# Patient Record
Sex: Female | Born: 1940
Health system: Southern US, Community
[De-identification: ages and names within clinical notes are randomized; demographics above are authoritative.]

## PROBLEM LIST (undated history)

## (undated) DIAGNOSIS — I10 Essential (primary) hypertension: Secondary | ICD-10-CM

## (undated) DIAGNOSIS — I2510 Atherosclerotic heart disease of native coronary artery without angina pectoris: Secondary | ICD-10-CM

## (undated) DIAGNOSIS — E119 Type 2 diabetes mellitus without complications: Secondary | ICD-10-CM

## (undated) DIAGNOSIS — N189 Chronic kidney disease, unspecified: Secondary | ICD-10-CM

## (undated) DIAGNOSIS — G4730 Sleep apnea, unspecified: Secondary | ICD-10-CM

## (undated) HISTORY — PX: CARDIAC CATHETERIZATION: SHX172

## (undated) HISTORY — PX: CORONARY ANGIOPLASTY: SHX604

## (undated) HISTORY — DX: Essential (primary) hypertension: I10

## (undated) HISTORY — DX: Atherosclerotic heart disease of native coronary artery without angina pectoris: I25.10

## (undated) HISTORY — DX: Type 2 diabetes mellitus without complications: E11.9

## (undated) HISTORY — DX: Chronic kidney disease, unspecified: N18.9

## (undated) HISTORY — DX: Sleep apnea, unspecified: G47.30

---

## 2006-08-14 HISTORY — PX: ACNE CYST REMOVAL: SUR1112

## 2015-06-04 DIAGNOSIS — I10 Essential (primary) hypertension: Secondary | ICD-10-CM | POA: Insufficient documentation

## 2017-06-04 DIAGNOSIS — Z9861 Coronary angioplasty status: Secondary | ICD-10-CM | POA: Insufficient documentation

## 2017-06-04 DIAGNOSIS — IMO0001 Reserved for inherently not codable concepts without codable children: Secondary | ICD-10-CM | POA: Insufficient documentation

## 2017-06-04 DIAGNOSIS — E785 Hyperlipidemia, unspecified: Secondary | ICD-10-CM | POA: Insufficient documentation

## 2017-06-04 DIAGNOSIS — R002 Palpitations: Secondary | ICD-10-CM | POA: Insufficient documentation

## 2017-06-04 DIAGNOSIS — I2510 Atherosclerotic heart disease of native coronary artery without angina pectoris: Secondary | ICD-10-CM | POA: Insufficient documentation

## 2018-01-21 DIAGNOSIS — E669 Obesity, unspecified: Secondary | ICD-10-CM | POA: Insufficient documentation

## 2019-04-28 ENCOUNTER — Encounter: Payer: Self-pay | Admitting: Family Medicine

## 2019-04-28 ENCOUNTER — Ambulatory Visit (INDEPENDENT_AMBULATORY_CARE_PROVIDER_SITE_OTHER): Payer: Medicare Other | Admitting: Family Medicine

## 2019-04-28 ENCOUNTER — Other Ambulatory Visit: Payer: Self-pay

## 2019-04-28 VITALS — BP 112/58 | HR 68 | Temp 96.6°F | Resp 16 | Ht 64.0 in | Wt 190.4 lb

## 2019-04-28 DIAGNOSIS — E119 Type 2 diabetes mellitus without complications: Secondary | ICD-10-CM | POA: Diagnosis not present

## 2019-04-28 DIAGNOSIS — I10 Essential (primary) hypertension: Secondary | ICD-10-CM | POA: Diagnosis not present

## 2019-04-28 DIAGNOSIS — M545 Low back pain: Secondary | ICD-10-CM

## 2019-04-28 DIAGNOSIS — G8929 Other chronic pain: Secondary | ICD-10-CM

## 2019-04-28 DIAGNOSIS — E782 Mixed hyperlipidemia: Secondary | ICD-10-CM

## 2019-04-28 DIAGNOSIS — Z794 Long term (current) use of insulin: Secondary | ICD-10-CM | POA: Diagnosis not present

## 2019-04-28 DIAGNOSIS — Z23 Encounter for immunization: Secondary | ICD-10-CM | POA: Diagnosis not present

## 2019-04-28 DIAGNOSIS — I25119 Atherosclerotic heart disease of native coronary artery with unspecified angina pectoris: Secondary | ICD-10-CM | POA: Diagnosis not present

## 2019-04-28 DIAGNOSIS — IMO0001 Reserved for inherently not codable concepts without codable children: Secondary | ICD-10-CM

## 2019-04-28 LAB — LIPID PANEL
Cholesterol: 94 mg/dL (ref 0–200)
HDL: 30.7 mg/dL — ABNORMAL LOW (ref >39.00)
LDL Cholesterol: 47 mg/dL (ref 0–99)
NonHDL: 62.97
Total CHOL/HDL Ratio: 3
Triglycerides: 80 mg/dL (ref 0.0–149.0)
VLDL: 16 mg/dL (ref 0.0–40.0)

## 2019-04-28 LAB — CBC WITH DIFFERENTIAL/PLATELET
Basophils Absolute: 0 10*3/uL (ref 0.0–0.1)
Basophils Relative: 0.5 % (ref 0.0–3.0)
Eosinophils Absolute: 0.1 10*3/uL (ref 0.0–0.7)
Eosinophils Relative: 1.6 % (ref 0.0–5.0)
HCT: 38.1 % (ref 36.0–46.0)
Hemoglobin: 12.2 g/dL (ref 12.0–15.0)
Lymphocytes Relative: 23.3 % (ref 12.0–46.0)
Lymphs Abs: 2.2 10*3/uL (ref 0.7–4.0)
MCHC: 32.1 g/dL (ref 30.0–36.0)
MCV: 89.1 fl (ref 78.0–100.0)
Monocytes Absolute: 0.7 10*3/uL (ref 0.1–1.0)
Monocytes Relative: 7.7 % (ref 3.0–12.0)
Neutro Abs: 6.2 10*3/uL (ref 1.4–7.7)
Neutrophils Relative %: 66.9 % (ref 43.0–77.0)
Platelets: 229 10*3/uL (ref 150.0–400.0)
RBC: 4.27 Mil/uL (ref 3.87–5.11)
RDW: 14.1 % (ref 11.5–15.5)
WBC: 9.2 10*3/uL (ref 4.0–10.5)

## 2019-04-28 LAB — COMPREHENSIVE METABOLIC PANEL
ALT: 13 U/L (ref 0–35)
AST: 18 U/L (ref 0–37)
Albumin: 4 g/dL (ref 3.5–5.2)
Alkaline Phosphatase: 105 U/L (ref 39–117)
BUN: 26 mg/dL — ABNORMAL HIGH (ref 6–23)
CO2: 28 mEq/L (ref 19–32)
Calcium: 10 mg/dL (ref 8.4–10.5)
Chloride: 106 mEq/L (ref 96–112)
Creatinine, Ser: 1.67 mg/dL — ABNORMAL HIGH (ref 0.40–1.20)
GFR: 29.72 mL/min — ABNORMAL LOW (ref >60.00)
Glucose, Bld: 125 mg/dL — ABNORMAL HIGH (ref 70–99)
Potassium: 3.9 mEq/L (ref 3.5–5.1)
Sodium: 143 mEq/L (ref 135–145)
Total Bilirubin: 0.4 mg/dL (ref 0.2–1.2)
Total Protein: 7.3 g/dL (ref 6.0–8.3)

## 2019-04-28 LAB — TSH: TSH: 1.15 u[IU]/mL (ref 0.35–4.50)

## 2019-04-28 LAB — HEMOGLOBIN A1C: Hgb A1c MFr Bld: 9.4 % — ABNORMAL HIGH (ref 4.6–6.5)

## 2019-04-28 NOTE — Patient Instructions (Signed)
We will call with lab work results when available. We will base next follow up appt off of lab results.

## 2019-04-28 NOTE — Progress Notes (Signed)
Subjective:    Patient ID: Vickie Kemp, female    DOB: 1940/12/17, 78 y.o.   MRN: [id number]  HPI   Patient presents to clinic to establish with PCP.  She recently moved to area from [place].  She has history of CAD, hypertension, insulin-dependent diabetes, hyperlipidemia obesity and coronary angioplasty.  Last lab work was in February 2020, we will get new labs today.  Patient states she feels stable on all her current medications.  Tolerates them without any adverse effects.  Denies any chest pain, shortness of breath, palpitations or lower extremity swelling.  States her blood sugars have been running a little higher in the 150s to 200s, knows that this is due to her eating ice cream almost every day for the past week or 2.  Patient states her favorite ice cream flavor, black walnut, is only available down here in the [place] and she has gone a little overboard over eating her favorite ice cream.  Plans to get back on the ball in regards to watching her sugar and carbohydrate intake.  Has never had colonoscopy and declines getting one at this time.  Understands risks of not getting colonoscopy.  Would like to hold off on getting mammogram ordered at this time, but will reconsider at future visit.  Is interested in flu vaccine today.  Patient also has chronic low back pain as dull with this off and on for many years.  Tries to do a lot of walking, but states sometimes when she is walking longer distances will have to stop due to aching in low back.  Does use Tylenol on occasion, but not regularly.  Has just discovered Biofreeze and feels that this is helped reduce back pain somewhat.  Does not do any sort or stretching or strengthening of muscle type exercises.  Patient Active Problem List   Diagnosis Date Noted  . Obesity 01/21/2018  . CAD (coronary artery disease) 06/04/2017  . Hyperlipemia 06/04/2017  . Insulin dependent diabetes mellitus ([place]) 06/04/2017  . S/P PTCA (percutaneous  transluminal coronary angioplasty) 06/04/2017  . Essential (primary) hypertension 06/04/2015   Social History   Tobacco Use  . Smoking status: Never Smoker  . Smokeless tobacco: Never Used  Substance Use Topics  . Alcohol use: Never    Frequency: Never   Past Surgical History:  Procedure Laterality Date  . ACNE CYST REMOVAL  2008   Groin area   History reviewed. No pertinent family history.   Review of Systems   Constitutional: Negative for chills, fatigue and fever.  HENT: Negative for congestion, ear pain, sinus pain and sore throat.   Eyes: Negative.   Respiratory: Negative for cough, shortness of breath and wheezing.   Cardiovascular: Negative for chest pain, palpitations and leg swelling.  Gastrointestinal: Negative for abdominal pain, diarrhea, nausea and vomiting.  Genitourinary: Negative for dysuria, frequency and urgency.  Musculoskeletal: Negative for arthralgias and myalgias.  Skin: Negative for color change, pallor and rash.  Neurological: Negative for syncope, light-headedness and headaches.  Psychiatric/Behavioral: The patient is not nervous/anxious.       Objective:   Physical Exam Vitals signs and nursing note reviewed.  Constitutional:      General: She is not in acute distress.    Appearance: She is not toxic-appearing or diaphoretic.  HENT:     Head: Normocephalic and atraumatic.  Eyes:     General: No scleral icterus.    Extraocular Movements: Extraocular movements intact.     Conjunctiva/sclera: Conjunctivae normal.  Pupils: Pupils are equal, round, and reactive to light.  Neck:     Musculoskeletal: Normal range of motion and neck supple. No neck rigidity.  Cardiovascular:     Rate and Rhythm: Normal rate and regular rhythm.     Heart sounds: Normal heart sounds.  Pulmonary:     Effort: Pulmonary effort is normal.     Breath sounds: Normal breath sounds.  Abdominal:     General: Bowel sounds are normal. There is no distension.      Palpations: Abdomen is soft. There is no mass.     Tenderness: There is no abdominal tenderness. There is no right CVA tenderness, left CVA tenderness, guarding or rebound.     Hernia: No hernia is present.  Musculoskeletal:     Right lower leg: No edema.     Left lower leg: No edema.  Skin:    General: Skin is warm and dry.     Capillary Refill: Capillary refill takes less than 2 seconds.     Coloration: Skin is not jaundiced or pale.  Neurological:     General: No focal deficit present.     Mental Status: She is alert and oriented to person, place, and time.     Gait: Gait normal.  Psychiatric:        Mood and Affect: Mood normal.        Behavior: Behavior normal.    Today's Vitals   04/28/19 1410  BP: (!) 112/58  Pulse: 68  Resp: 16  Temp: (!) 96.6 F (35.9 C)  TempSrc: Temporal  SpO2: 95%  Weight: 190 lb 6.4 oz (86.4 kg)  Height: _0  (1.626 m)   Body mass index is 32.68 kg/m.     Assessment & Plan:    1. Coronary artery disease involving native heart with angina pectoris, unspecified vessel or lesion type [facility])  - Ambulatory referral to Cardiology  2. Essential (primary) hypertension  - CBC w/Diff - Comp Met (CMET) - Ambulatory referral to Cardiology  3. Insulin dependent diabetes mellitus (HCC)  - TSH - Lipid panel - Hemoglobin A1c  4. Mixed hyperlipidemia  - Lipid panel - Ambulatory referral to Cardiology  5. Need for immunization against influenza  - Flu Vaccine QUAD High Dose(Fluad)  6. Chronic low back pain  Discussed stretching multiple times per day. Discussed topical rubs like bengay or biofreeze & heating pad. Discussed use of tylenol as needed for pain. Offered PT referral, declines at this time.    Blood work collected in clinic.  Reviewed healthy diet and regular physical activity; discussed carb counting and eating balanced meals. She does not need refill today. Will base need for next follow up after lab results, she will follow  up at least every 3 months

## 2019-04-29 ENCOUNTER — Other Ambulatory Visit: Payer: Self-pay | Admitting: Family Medicine

## 2019-04-29 DIAGNOSIS — N289 Disorder of kidney and ureter, unspecified: Secondary | ICD-10-CM

## 2019-05-01 ENCOUNTER — Ambulatory Visit (INDEPENDENT_AMBULATORY_CARE_PROVIDER_SITE_OTHER): Payer: Medicare Other | Admitting: Podiatry

## 2019-05-01 ENCOUNTER — Other Ambulatory Visit: Payer: Self-pay

## 2019-05-01 ENCOUNTER — Encounter: Payer: Self-pay | Admitting: Podiatry

## 2019-05-01 DIAGNOSIS — M79674 Pain in right toe(s): Secondary | ICD-10-CM

## 2019-05-01 DIAGNOSIS — B351 Tinea unguium: Secondary | ICD-10-CM | POA: Insufficient documentation

## 2019-05-01 DIAGNOSIS — M129 Arthropathy, unspecified: Secondary | ICD-10-CM | POA: Diagnosis not present

## 2019-05-01 DIAGNOSIS — E1159 Type 2 diabetes mellitus with other circulatory complications: Secondary | ICD-10-CM | POA: Diagnosis not present

## 2019-05-01 DIAGNOSIS — M79675 Pain in left toe(s): Secondary | ICD-10-CM

## 2019-05-01 NOTE — Progress Notes (Signed)
This patient presents to the office with chief complaint of long thick nails and diabetic feet.  She states she is having pain on the top of her right foot. .  This patient says there are long thick painful nails.  These nails are painful walking and wearing shoes.  Patient has no history of infection or drainage from both feet.  Patient is unable to  self treat his own nails . This patient presents  to the office today for treatment of the  long nails her right foot pain  and a foot evaluation due to history of  diabetes.  General Appearance  Alert, conversant and in no acute stress.  Vascular  Dorsalis pedis and posterior tibial  pulses are not  palpable  bilaterally.  Capillary return is within normal limits  bilaterally. Temperature is within normal limits  bilaterally.  Neurologic  Senn-Weinstein monofilament wire test within normal limits left.  LOPS diminished right foot.. Muscle power within normal limits bilaterally.  Nails Thick disfigured discolored nails with subungual debris  from hallux to fifth toes bilaterally. No evidence of bacterial infection or drainage bilaterally.  Orthopedic  No limitations of motion of motion feet .  No crepitus or effusions noted.  No bony pathology or digital deformities noted.  Midfoot DJD  B/L with right greater than left.  Rearfoot  ROM diminished.  Skin  normotropic skin with no porokeratosis noted bilaterally.  No signs of infections or ulcers noted.     Onychomycosis  Diabetes with no foot complications  Midfoot arthritis B/L  IE  Debride nails x 10.  A diabetic foot exam was performed and there is  evidence of  vascular or neurologic pathology.   RTC 3 months. Discussed powerstep insoles to be worn to support both feet and eliminate her midfoot pain.  RTC 3 months.  Gardiner Barefoot DPM

## 2019-05-09 ENCOUNTER — Ambulatory Visit (INDEPENDENT_AMBULATORY_CARE_PROVIDER_SITE_OTHER): Payer: Medicare Other | Admitting: Cardiology

## 2019-05-09 ENCOUNTER — Encounter: Payer: Self-pay | Admitting: Cardiology

## 2019-05-09 ENCOUNTER — Other Ambulatory Visit: Payer: Self-pay

## 2019-05-09 VITALS — BP 160/70 | HR 65 | Ht 65.0 in | Wt 189.0 lb

## 2019-05-09 DIAGNOSIS — G4733 Obstructive sleep apnea (adult) (pediatric): Secondary | ICD-10-CM | POA: Diagnosis not present

## 2019-05-09 DIAGNOSIS — I10 Essential (primary) hypertension: Secondary | ICD-10-CM

## 2019-05-09 DIAGNOSIS — I25119 Atherosclerotic heart disease of native coronary artery with unspecified angina pectoris: Secondary | ICD-10-CM

## 2019-05-09 DIAGNOSIS — R0609 Other forms of dyspnea: Secondary | ICD-10-CM | POA: Diagnosis not present

## 2019-05-09 NOTE — Patient Instructions (Signed)
Medication Instructions:  Your physician recommends that you continue on your current medications as directed. Please refer to the Current Medication list given to you today.  If you need a refill on your cardiac medications before your next appointment, please call your pharmacy.   Lab work: none If you have labs (blood work) drawn today and your tests are completely normal, you will receive your results only by: [name] MyChart Message (if you have MyChart) OR . A paper copy in the mail If you have any lab test that is abnormal or we need to change your treatment, we will call you to review the results.  Testing/Procedures: Your physician has requested that you have an echocardiogram. Echocardiography is a painless test that uses sound waves to create images of your heart. It provides your doctor with information about the size and shape of your heart and how well your heart's chambers and valves are working. This procedure takes approximately one hour. There are no restrictions for this procedure. You may get an IV, if needed, to receive an ultrasound enhancing agent through to better visualize your heart.     Follow-Up: At [facility], you and your health needs are our priority.  As part of our continuing mission to provide you with exceptional heart care, we have created designated Provider Care Teams.  These Care Teams include your primary Cardiologist (physician) and Advanced Practice Providers (APPs -  Physician Assistants and Nurse Practitioners) who all work together to provide you with the care you need, when you need it. You will need a follow up appointment in 6 weeks.  You may see Kate Sable, MD or one of the following Advanced Practice Providers on your designated Care Team:   Murray Hodgkins, NP Christell Faith, PA-C . Marrianne Mood, PA-C   Echocardiogram An echocardiogram is a procedure that uses painless sound waves (ultrasound) to produce an image of the heart.  Images from an echocardiogram can provide important information about:  Signs of coronary artery disease (CAD).  Aneurysm detection. An aneurysm is a weak or damaged part of an artery wall that bulges out from the normal force of blood pumping through the body.  Heart size and shape. Changes in the size or shape of the heart can be associated with certain conditions, including heart failure, aneurysm, and CAD.  Heart muscle function.  Heart valve function.  Signs of a past heart attack.  Fluid buildup around the heart.  Thickening of the heart muscle.  A tumor or infectious growth around the heart valves. Tell a health care provider about:  Any allergies you have.  All medicines you are taking, including vitamins, herbs, eye drops, creams, and over-the-counter medicines.  Any blood disorders you have.  Any surgeries you have had.  Any medical conditions you have.  Whether you are pregnant or may be pregnant. What are the risks? Generally, this is a safe procedure. However, problems may occur, including:  Allergic reaction to dye (contrast) that may be used during the procedure. What happens before the procedure? No specific preparation is needed. You may eat and drink normally. What happens during the procedure?   An IV tube may be inserted into one of your veins.  You may receive contrast through this tube. A contrast is an injection that improves the quality of the pictures from your heart.  A gel will be applied to your chest.  A wand-like tool (transducer) will be moved over your chest. The gel will help to transmit  the sound waves from the transducer.  The sound waves will harmlessly bounce off of your heart to allow the heart images to be captured in real-time motion. The images will be recorded on a computer. The procedure may vary among health care providers and hospitals. What happens after the procedure?  You may return to your normal, everyday life,  including diet, activities, and medicines, unless your health care provider tells you not to do that. Summary  An echocardiogram is a procedure that uses painless sound waves (ultrasound) to produce an image of the heart.  Images from an echocardiogram can provide important information about the size and shape of your heart, heart muscle function, heart valve function, and fluid buildup around your heart.  You do not need to do anything to prepare before this procedure. You may eat and drink normally.  After the echocardiogram is completed, you may return to your normal, everyday life, unless your health care provider tells you not to do that. This information is not intended to replace advice given to you by your health care provider. Make sure you discuss any questions you have with your health care provider. Document Released: 07/28/2000 Document Revised: 11/21/2018 Document Reviewed: 09/02/2016 Elsevier Patient Education  2020 [employer].

## 2019-05-09 NOTE — Progress Notes (Signed)
Cardiology Office Note:    Date:  05/09/2019   ID:  Larin Depaoli, DOB [birth year], MRN [id number]  PCP:  Jodelle Green, FNP  Cardiologist:  Kate Sable, MD  Electrophysiologist:  None   Referring MD: Jodelle Green, FNP   Chief Complaint  Patient presents with  . New Patient (Initial Visit)    referred for CAD. Patient c/o Swelling in ankles and SOB. Meds reviewed verbally with patient.     History of Present Illness:    Vickie Kemp is a 78 y.o. female with a hx of hypertension, obesity, CAD/MI (status post DES to mLAD 04/2013 at [facility] [place]).  She presents today to establish care after recently moving to [place] from [place].  Per records, angiogram in 2014 showed 90% mid LAD lesion, 80% OM, 60% RCA.  EF at that time was 40%.  She states having some dyspnea on exertion.  Walking causes her to be short of breath, needing to take frequent breaks in her walks.  She has some edema when she has her legs down so she tries to prop them up, and that usually helps.  She denies symptoms of chest pain.  She states having obstructive sleep apnea but does not wear her CPAP machine when she goes to sleep.  She states her blood pressure is usually well controlled in the range of 130s to high as of 140s.   Past Medical History:  Diagnosis Date  . Coronary artery disease    MI, pci to mid LAD 2014. LHC 80%OM, 60%RCA, 90% LAD  . Diabetes mellitus without complication ([place])   . Hypertension     Past Surgical History:  Procedure Laterality Date  . ACNE CYST REMOVAL  2008   Groin area    Current Medications: Current Meds  Medication Sig  . amLODipine (NORVASC) 10 MG tablet Take 10 mg by mouth daily.  [name] aspirin EC 81 MG tablet Take 81 mg by mouth daily.  [name] atorvastatin (LIPITOR) 40 MG tablet Take 40 mg by mouth daily.  . furosemide (LASIX) 40 MG tablet Take 40 mg by mouth.  . Insulin Detemir (LEVEMIR [place]) Inject 30 Units into the skin  at bedtime.  . Insulin Lispro (HUMALOG [place]) Inject 10 Units into the skin as needed.  . labetalol (NORMODYNE) 300 MG tablet Take 300 mg by mouth 2 (two) times daily.  [name] latanoprost (XALATAN) 0.005 % ophthalmic solution Place 1 drop into the left eye at bedtime.  [name] MAGNESIUM SULFATE PO Take 500 mg by mouth.  . Vitamin D, Ergocalciferol, (DRISDOL) 1.25 MG (50000 UT) CAPS capsule Take 50,000 Units by mouth every 7 (seven) days.     Allergies:   Amoxicillin   Social History   Socioeconomic History  . Marital status: Single    Spouse name: Not on file  . Number of children: Not on file  . Years of education: Not on file  . Highest education level: Not on file  Occupational History  . Not on file  Social Needs  . Financial resource strain: Not on file  . Food insecurity    Worry: Not on file    Inability: Not on file  . Transportation needs    Medical: Not on file    Non-medical: Not on file  Tobacco Use  . Smoking status: Never Smoker  . Smokeless tobacco: Never Used  Substance and Sexual Activity  . Alcohol use: Never    Frequency: Never  .  Drug use: Never  . Sexual activity: Not on file  Lifestyle  . Physical activity    Days per week: Not on file    Minutes per session: Not on file  . Stress: Not on file  Relationships  . Social Herbalist on phone: Not on file    Gets together: Not on file    Attends religious service: Not on file    Active member of club or organization: Not on file    Attends meetings of clubs or organizations: Not on file    Relationship status: Not on file  Other Topics Concern  . Not on file  Social History Narrative  . Not on file     Family History: The patient's family history includes Diabetes in her mother; Heart disease in her father.  ROS:   Please see the history of present illness.     All other systems reviewed and are negative.  EKGs/Labs/Other Studies Reviewed:    The following studies were reviewed today:    EKG:  EKG is  ordered today.  The ekg ordered today demonstrates normal sinus rhythm, old septal infarct.  Recent Labs: 04/28/2019: ALT 13; BUN 26; Creatinine, Ser 1.67; Hemoglobin 12.2; Platelets 229.0; Potassium 3.9; Sodium 143; TSH 1.15  Recent Lipid Panel    Component Value Date/Time   CHOL 94 04/28/2019 1408   TRIG 80.0 04/28/2019 1408   HDL 30.70 (L) 04/28/2019 1408   CHOLHDL 3 04/28/2019 1408   VLDL 16.0 04/28/2019 1408   LDLCALC 47 04/28/2019 1408    Physical Exam:    VS:  BP (!) 160/70 (BP Location: Right Arm, Patient Position: Sitting, Cuff Size: Normal)   Pulse 65   Ht 5\' 5"  (1.651 m)   Wt 189 lb (85.7 kg)   BMI 31.45 kg/m     Wt Readings from Last 3 Encounters:  05/09/19 189 lb (85.7 kg)  04/28/19 190 lb 6.4 oz (86.4 kg)     GEN:  Well nourished, well developed in no acute distress HEENT: Normal NECK: No JVD; No carotid bruits LYMPHATICS: No lymphadenopathy CARDIAC: RRR, no murmurs, rubs, gallops RESPIRATORY:  Clear to auscultation without rales, wheezing or rhonchi  ABDOMEN: Soft, non-tender, non-distended MUSCULOSKELETAL: Trace edema; No deformity  SKIN: Warm and dry NEUROLOGIC:  Alert and oriented x 3 PSYCHIATRIC:  Normal affect   ASSESSMENT:   History of CAD no current chest pain.  Her blood pressure seems elevated today, but is usually well controlled upon chart review on the current therapy.  This may be secondary to whitecoat hypertension.  Patient encouraged to check blood pressure at home.  We will go over readings upon next visit. 1. Coronary artery disease involving native heart with angina pectoris, unspecified vessel or lesion type ([place])   2. DOE (dyspnea on exertion)   3. Hypertension, unspecified type   4. OSA (obstructive sleep apnea)    PLAN:      1. CAD status post PCI.  Continue aspirin 81 mg, Lipitor 40 mg. 2. We will get echocardiogram.  Patient encouraged to use CPAP for OSA as this can be contributing to her symptoms. 3. Blood  pressure seems elevated today.  Continue Norvasc 10 mg, labetalol as currently prescribed.  Follow-up after echo.   Medication Adjustments/Labs and Tests Ordered: Current medicines are reviewed at length with the patient today.  Concerns regarding medicines are outlined above.  Orders Placed This Encounter  Procedures  . EKG 12-Lead  . ECHOCARDIOGRAM COMPLETE  No orders of the defined types were placed in this encounter.   Patient Instructions  Medication Instructions:  Your physician recommends that you continue on your current medications as directed. Please refer to the Current Medication list given to you today.  If you need a refill on your cardiac medications before your next appointment, please call your pharmacy.   Lab work: none If you have labs (blood work) drawn today and your tests are completely normal, you will receive your results only by: [name] MyChart Message (if you have MyChart) OR . A paper copy in the mail If you have any lab test that is abnormal or we need to change your treatment, we will call you to review the results.  Testing/Procedures: Your physician has requested that you have an echocardiogram. Echocardiography is a painless test that uses sound waves to create images of your heart. It provides your doctor with information about the size and shape of your heart and how well your heart's chambers and valves are working. This procedure takes approximately one hour. There are no restrictions for this procedure. You may get an IV, if needed, to receive an ultrasound enhancing agent through to better visualize your heart.     Follow-Up: At [facility], you and your health needs are our priority.  As part of our continuing mission to provide you with exceptional heart care, we have created designated Provider Care Teams.  These Care Teams include your primary Cardiologist (physician) and Advanced Practice Providers (APPs -  Physician Assistants and Nurse  Practitioners) who all work together to provide you with the care you need, when you need it. You will need a follow up appointment in 6 weeks.  You may see Kate Sable, MD or one of the following Advanced Practice Providers on your designated Care Team:   Murray Hodgkins, NP Christell Faith, PA-C . Marrianne Mood, PA-C   Echocardiogram An echocardiogram is a procedure that uses painless sound waves (ultrasound) to produce an image of the heart. Images from an echocardiogram can provide important information about:  Signs of coronary artery disease (CAD).  Aneurysm detection. An aneurysm is a weak or damaged part of an artery wall that bulges out from the normal force of blood pumping through the body.  Heart size and shape. Changes in the size or shape of the heart can be associated with certain conditions, including heart failure, aneurysm, and CAD.  Heart muscle function.  Heart valve function.  Signs of a past heart attack.  Fluid buildup around the heart.  Thickening of the heart muscle.  A tumor or infectious growth around the heart valves. Tell a health care provider about:  Any allergies you have.  All medicines you are taking, including vitamins, herbs, eye drops, creams, and over-the-counter medicines.  Any blood disorders you have.  Any surgeries you have had.  Any medical conditions you have.  Whether you are pregnant or may be pregnant. What are the risks? Generally, this is a safe procedure. However, problems may occur, including:  Allergic reaction to dye (contrast) that may be used during the procedure. What happens before the procedure? No specific preparation is needed. You may eat and drink normally. What happens during the procedure?   An IV tube may be inserted into one of your veins.  You may receive contrast through this tube. A contrast is an injection that improves the quality of the pictures from your heart.  A gel will be applied to  your chest.  A wand-like tool (transducer) will be moved over your chest. The gel will help to transmit the sound waves from the transducer.  The sound waves will harmlessly bounce off of your heart to allow the heart images to be captured in real-time motion. The images will be recorded on a computer. The procedure may vary among health care providers and hospitals. What happens after the procedure?  You may return to your normal, everyday life, including diet, activities, and medicines, unless your health care provider tells you not to do that. Summary  An echocardiogram is a procedure that uses painless sound waves (ultrasound) to produce an image of the heart.  Images from an echocardiogram can provide important information about the size and shape of your heart, heart muscle function, heart valve function, and fluid buildup around your heart.  You do not need to do anything to prepare before this procedure. You may eat and drink normally.  After the echocardiogram is completed, you may return to your normal, everyday life, unless your health care provider tells you not to do that. This information is not intended to replace advice given to you by your health care provider. Make sure you discuss any questions you have with your health care provider. Document Released: 07/28/2000 Document Revised: 11/21/2018 Document Reviewed: 09/02/2016 Elsevier Patient Education  2020 [place], Kate Sable, MD  05/09/2019 3:19 PM    [place]

## 2019-06-10 DIAGNOSIS — E1122 Type 2 diabetes mellitus with diabetic chronic kidney disease: Secondary | ICD-10-CM | POA: Insufficient documentation

## 2019-06-10 LAB — HM HEPATITIS C SCREENING LAB: HM Hepatitis Screen: NEGATIVE

## 2019-06-11 ENCOUNTER — Other Ambulatory Visit (HOSPITAL_COMMUNITY): Payer: Self-pay | Admitting: Nephrology

## 2019-06-11 ENCOUNTER — Other Ambulatory Visit: Payer: Self-pay | Admitting: Nephrology

## 2019-06-11 DIAGNOSIS — N184 Chronic kidney disease, stage 4 (severe): Secondary | ICD-10-CM

## 2019-06-20 ENCOUNTER — Ambulatory Visit (INDEPENDENT_AMBULATORY_CARE_PROVIDER_SITE_OTHER): Payer: Medicare Other

## 2019-06-20 ENCOUNTER — Other Ambulatory Visit: Payer: Self-pay

## 2019-06-20 DIAGNOSIS — I10 Essential (primary) hypertension: Secondary | ICD-10-CM

## 2019-06-20 DIAGNOSIS — R0609 Other forms of dyspnea: Secondary | ICD-10-CM

## 2019-06-20 DIAGNOSIS — R0600 Dyspnea, unspecified: Secondary | ICD-10-CM

## 2019-06-20 DIAGNOSIS — I25119 Atherosclerotic heart disease of native coronary artery with unspecified angina pectoris: Secondary | ICD-10-CM | POA: Diagnosis not present

## 2019-06-24 ENCOUNTER — Encounter: Payer: Self-pay | Admitting: Cardiology

## 2019-06-24 ENCOUNTER — Ambulatory Visit (INDEPENDENT_AMBULATORY_CARE_PROVIDER_SITE_OTHER): Payer: Medicare Other | Admitting: Cardiology

## 2019-06-24 ENCOUNTER — Other Ambulatory Visit: Payer: Self-pay

## 2019-06-24 VITALS — BP 120/70 | HR 66 | Ht 65.0 in | Wt 191.0 lb

## 2019-06-24 DIAGNOSIS — I25119 Atherosclerotic heart disease of native coronary artery with unspecified angina pectoris: Secondary | ICD-10-CM

## 2019-06-24 DIAGNOSIS — G4733 Obstructive sleep apnea (adult) (pediatric): Secondary | ICD-10-CM | POA: Diagnosis not present

## 2019-06-24 DIAGNOSIS — I10 Essential (primary) hypertension: Secondary | ICD-10-CM | POA: Diagnosis not present

## 2019-06-24 DIAGNOSIS — I340 Nonrheumatic mitral (valve) insufficiency: Secondary | ICD-10-CM | POA: Diagnosis not present

## 2019-06-24 NOTE — Progress Notes (Signed)
Cardiology Office Note:    Date:  06/24/2019   ID:  Doxie Augenstein, DOB 28-Jul-1941, MRN [id number]  PCP:  Jodelle Green, FNP  Cardiologist:  Kate Sable, MD  Electrophysiologist:  None   Referring MD: Jodelle Green, FNP   Chief Complaint  Patient presents with  . office visit    F/U after echo; Meds verbally reviewed with patient.    History of Present Illness:    Vickie Kemp is a 78 y.o. female with a hx of hypertension, obesity, CAD/MI (status post DES to mLAD 04/2013 at [facility] [place]).  She presents today for follow-up. She was originally seen to establish care after recently moving to [place] from [place].  Per records, angiogram in 2014 showed 90% mid LAD lesion, 80% OM, 60% RCA.  EF at that time was 40%.  At last visit, she stated having some dyspnea on exertion.  Walking causes her to be short of breath, needing to take frequent breaks in her walks.  She denied symptoms of chest pain.  She states having obstructive sleep apnea but does not wear her CPAP machine when she goes to sleep.    Echocardiogram was ordered.  Patient was also encouraged to use his CPAP as OSA could be contributing to his symptoms, but she still states she has not been using it.[name]  He now presents for echocardiogram results.    Past Medical History:  Diagnosis Date  . Coronary artery disease    MI, pci to mid LAD 2014. LHC 80%OM, 60%RCA, 90% LAD  . Diabetes mellitus without complication ([place])   . Hypertension     Past Surgical History:  Procedure Laterality Date  . ACNE CYST REMOVAL  2008   Groin area  . CARDIAC CATHETERIZATION    . CORONARY ANGIOPLASTY      Current Medications: Current Meds  Medication Sig  . acetaminophen (TYLENOL) 650 MG CR tablet Take 650 mg by mouth 3 (three) times daily as needed.  [name] amLODipine (NORVASC) 10 MG tablet Take 10 mg by mouth daily.  [name] aspirin EC 81 MG tablet Take 81 mg by mouth daily.  [name] atorvastatin  (LIPITOR) 40 MG tablet Take 40 mg by mouth daily.  . furosemide (LASIX) 40 MG tablet Take 40 mg by mouth.  . Insulin Detemir (LEVEMIR [place]) Inject 30 Units into the skin at bedtime.  . Insulin Lispro (HUMALOG [place]) Inject 10 Units into the skin as needed.  . labetalol (NORMODYNE) 300 MG tablet Take 300 mg by mouth 2 (two) times daily.  [name] latanoprost (XALATAN) 0.005 % ophthalmic solution Place 1 drop into the left eye at bedtime.  [name] MAGNESIUM SULFATE PO Take 500 mg by mouth.  . Vitamin D, Ergocalciferol, (DRISDOL) 1.25 MG (50000 UT) CAPS capsule Take 50,000 Units by mouth every 7 (seven) days.     Allergies:   Amoxicillin   Social History   Socioeconomic History  . Marital status: Single    Spouse name: Not on file  . Number of children: Not on file  . Years of education: Not on file  . Highest education level: Not on file  Occupational History  . Not on file  Social Needs  . Financial resource strain: Not on file  . Food insecurity    Worry: Not on file    Inability: Not on file  . Transportation needs    Medical: Not on file    Non-medical: Not on file  Tobacco Use  .  Smoking status: Never Smoker  . Smokeless tobacco: Never Used  Substance and Sexual Activity  . Alcohol use: Never    Frequency: Never  . Drug use: Never  . Sexual activity: Not on file  Lifestyle  . Physical activity    Days per week: Not on file    Minutes per session: Not on file  . Stress: Not on file  Relationships  . Social Herbalist on phone: Not on file    Gets together: Not on file    Attends religious service: Not on file    Active member of club or organization: Not on file    Attends meetings of clubs or organizations: Not on file    Relationship status: Not on file  Other Topics Concern  . Not on file  Social History Narrative  . Not on file     Family History: The patient's family history includes Diabetes in her mother; Heart disease in her father.  ROS:   Please see  the history of present illness.     All other systems reviewed and are negative.  EKGs/Labs/Other Studies Reviewed:    The following studies were reviewed today: TTE 2019-06-23 1. Left ventricular ejection fraction, by visual estimation, is 60 to 65%. The left ventricle has normal function. There is no left ventricular hypertrophy.  2. Left ventricular diastolic parameters are consistent with Grade I diastolic dysfunction (impaired relaxation).  3. Global right ventricle has normal systolic function.The right ventricular size is normal. No increase in right ventricular wall thickness.  4. Left atrial size was mildly dilated.  5. Mild to moderate mitral valve regurgitation.  6. Tricuspid valve regurgitation mild-moderate.  7. The pulmonic valve was normal in structure. Pulmonic valve regurgitation is not visualized.  8. Moderately elevated pulmonary artery systolic pressure.  9. The tricuspid regurgitant velocity is 3.28 m/s, and with an assumed right atrial pressure of 10 mmHg, the estimated right ventricular systolic pressure is moderately elevated at 53.0 mmHg.  EKG:  EKG is  ordered today.  The ekg ordered today demonstrates normal sinus rhythm, first-degree AV block, cannot rule out anterior infarct.   Recent Labs: 04/28/2019: ALT 13; BUN 26; Creatinine, Ser 1.67; Hemoglobin 12.2; Platelets 229.0; Potassium 3.9; Sodium 143; TSH 1.15  Recent Lipid Panel    Component Value Date/Time   CHOL 94 04/28/2019 1408   TRIG 80.0 04/28/2019 1408   HDL 30.70 (L) 04/28/2019 1408   CHOLHDL 3 04/28/2019 1408   VLDL 16.0 04/28/2019 1408   LDLCALC 47 04/28/2019 1408    Physical Exam:    VS:  BP 120/70 (BP Location: Right Arm, Patient Position: Sitting, Cuff Size: Normal)   Pulse 66   Ht 5\' 5"  (1.651 m)   Wt 191 lb (86.6 kg)   SpO2 97%   BMI 31.78 kg/m     Wt Readings from Last 3 Encounters:  06/24/19 191 lb (86.6 kg)  05/09/19 189 lb (85.7 kg)  04/28/19 190 lb 6.4 oz (86.4 kg)      GEN:  Well nourished, well developed in no acute distress HEENT: Normal NECK: No JVD; No carotid bruits LYMPHATICS: No lymphadenopathy CARDIAC: RRR, no murmurs, rubs, gallops RESPIRATORY:  Clear to auscultation without rales, wheezing or rhonchi  ABDOMEN: Soft, non-tender, non-distended MUSCULOSKELETAL: Trace edema; No deformity  SKIN: Warm and dry NEUROLOGIC:  Alert and oriented x 3 PSYCHIATRIC:  Normal affect   ASSESSMENT:   Patient with history of CAD PCI.  No current chest pain.  Echocardiogram shows normal ejection fraction with grade 1 diastolic dysfunction.  Mild to moderate mitral regurgitation.  Blood pressure is well controlled today.  Patient still not compliant with CPAP machine which could be contributing to her symptoms of fatigue and shortness of breath. 1. Coronary artery disease involving native heart with angina pectoris, unspecified vessel or lesion type ([place])   2. Hypertension, unspecified type   3. OSA (obstructive sleep apnea)   4. Mitral valve insufficiency, unspecified etiology    PLAN:      1. CAD status post PCI.  Continue aspirin 81 mg, Lipitor 40 mg. 2. Continue amlodipine and labetalol as currently prescribed. Blood pressure well controlled. 3. Patient encouraged to use CPAP as prescribed.  If after 3 to 4 months symptoms still persist with CPAP compliance, we will consider a stress test. 4. Repeat echocardiogram in about 1 to 2 years.  Total encounter time more than 40 minutes  Greater than 50% was spent in counseling and coordination of care with the patient    Medication Adjustments/Labs and Tests Ordered: Current medicines are reviewed at length with the patient today.  Concerns regarding medicines are outlined above.  Orders Placed This Encounter  Procedures  . EKG 12-Lead   No orders of the defined types were placed in this encounter.   Patient Instructions  Medication Instructions:  Your physician recommends that you continue on your  current medications as directed. Please refer to the Current Medication list given to you today.  *If you need a refill on your cardiac medications before your next appointment, please call your pharmacy*  Lab Work: None ordered If you have labs (blood work) drawn today and your tests are completely normal, you will receive your results only by: [name] MyChart Message (if you have MyChart) OR . A paper copy in the mail If you have any lab test that is abnormal or we need to change your treatment, we will call you to review the results.  Testing/Procedures: None ordered  Follow-Up: At [facility], you and your health needs are our priority.  As part of our continuing mission to provide you with exceptional heart care, we have created designated Provider Care Teams.  These Care Teams include your primary Cardiologist (physician) and Advanced Practice Providers (APPs -  Physician Assistants and Nurse Practitioners) who all work together to provide you with the care you need, when you need it.  Your next appointment:   4 months  The format for your next appointment:   In Person  Provider:    You may see Kate Sable, MD or one of the following Advanced Practice Providers on your designated Care Team:    Murray Hodgkins, NP  Christell Faith, PA-C  Marrianne Mood, PA-C   Other Instructions N/A     Signed, Kate Sable, MD  06/24/2019 10:15 AM    [place]

## 2019-06-24 NOTE — Patient Instructions (Signed)
Medication Instructions:  Your physician recommends that you continue on your current medications as directed. Please refer to the Current Medication list given to you today.  *If you need a refill on your cardiac medications before your next appointment, please call your pharmacy*  Lab Work: None ordered If you have labs (blood work) drawn today and your tests are completely normal, you will receive your results only by: [name] MyChart Message (if you have MyChart) OR . A paper copy in the mail If you have any lab test that is abnormal or we need to change your treatment, we will call you to review the results.  Testing/Procedures: None ordered  Follow-Up: At [facility], you and your health needs are our priority.  As part of our continuing mission to provide you with exceptional heart care, we have created designated Provider Care Teams.  These Care Teams include your primary Cardiologist (physician) and Advanced Practice Providers (APPs -  Physician Assistants and Nurse Practitioners) who all work together to provide you with the care you need, when you need it.  Your next appointment:   4 months  The format for your next appointment:   In Person  Provider:    You may see Kate Sable, MD or one of the following Advanced Practice Providers on your designated Care Team:    Murray Hodgkins, NP  Christell Faith, PA-C  Marrianne Mood, PA-C   Other Instructions N/A

## 2019-07-01 ENCOUNTER — Other Ambulatory Visit: Payer: Self-pay

## 2019-07-01 ENCOUNTER — Ambulatory Visit
Admission: RE | Admit: 2019-07-01 | Discharge: 2019-07-01 | Disposition: A | Discharge status: Home / Self Care | Payer: Medicare Other | Source: Ambulatory Visit | Attending: Nephrology | Admitting: Nephrology

## 2019-07-01 DIAGNOSIS — N184 Chronic kidney disease, stage 4 (severe): Secondary | ICD-10-CM

## 2019-07-01 IMAGING — US US RENAL
1 series · 14 of 25 positions shown · non-contrast
Comparison: None.

CLINICAL DATA: Chronic kidney disease stage IV

EXAM:
RENAL / URINARY TRACT ULTRASOUND COMPLETE

[Series 1: us renal · 14 of 40 slices shown]
[im 1/40]
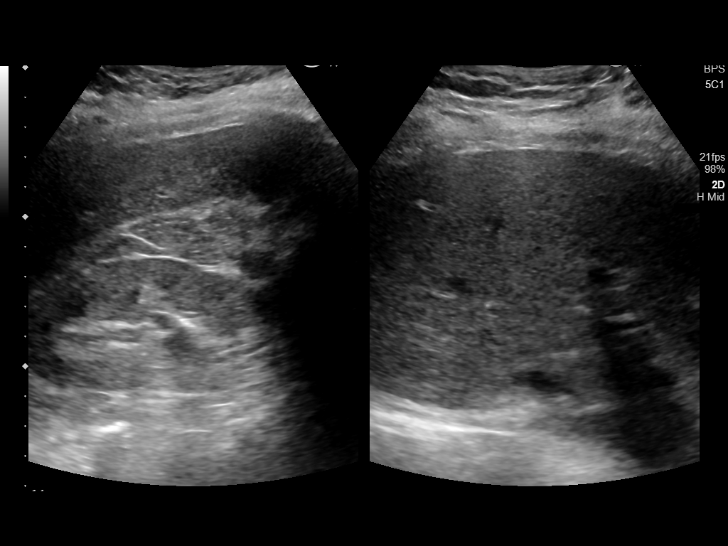
[im 4/40]
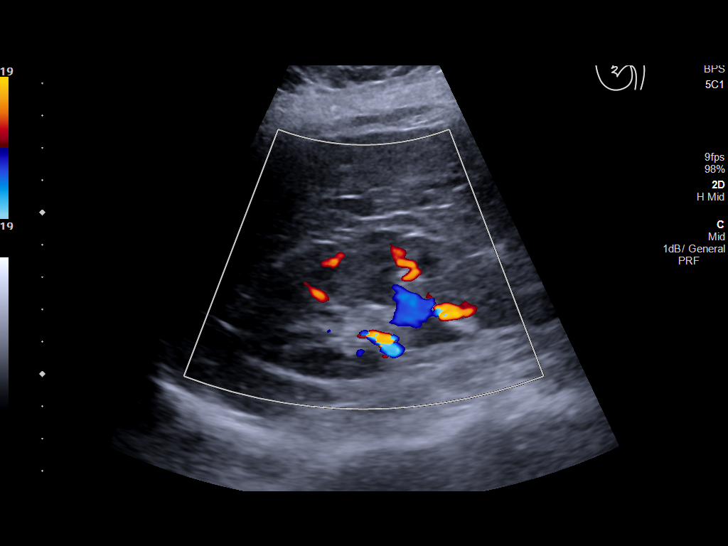
[im 7/40]
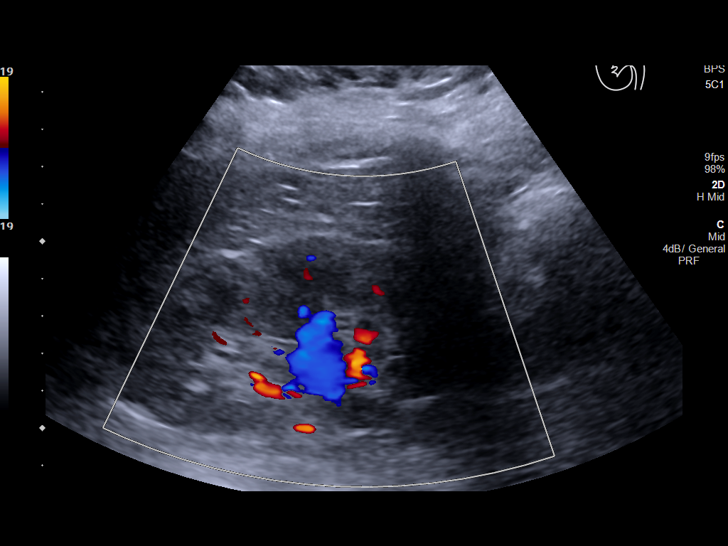
[im 10/40]
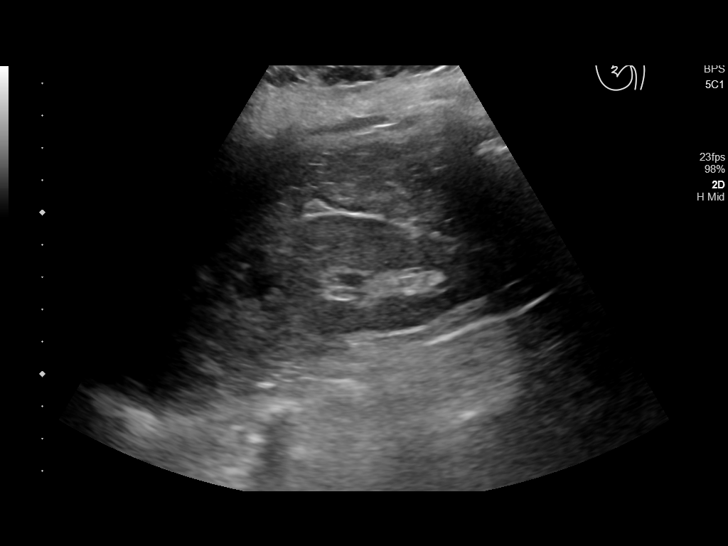
[im 14/40]
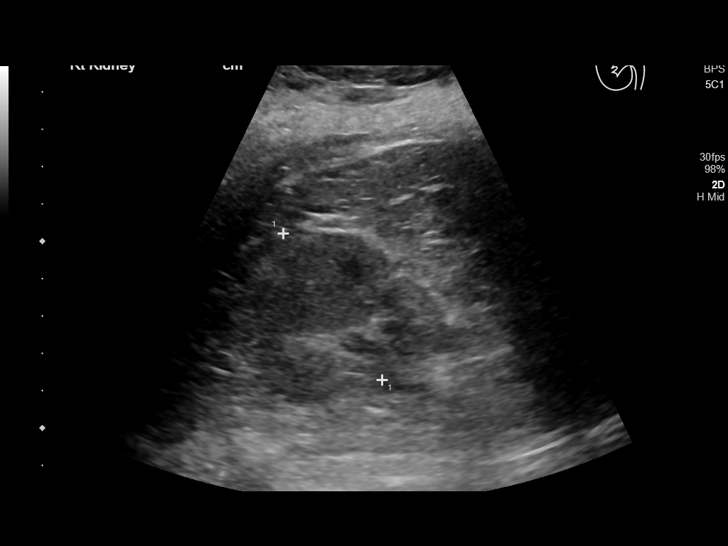
[im 15/40]
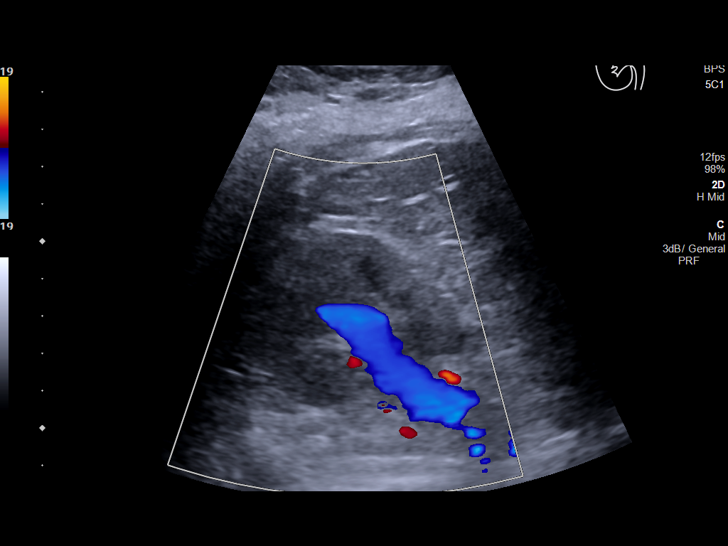
[im 18/40]
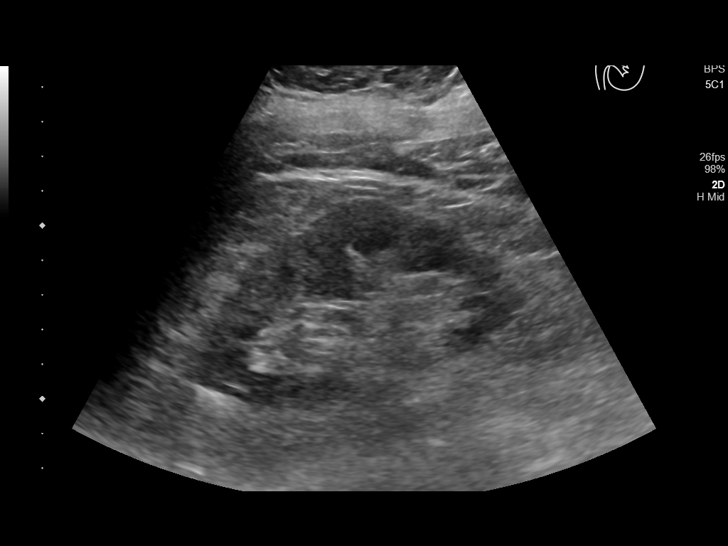
[im 22/40]
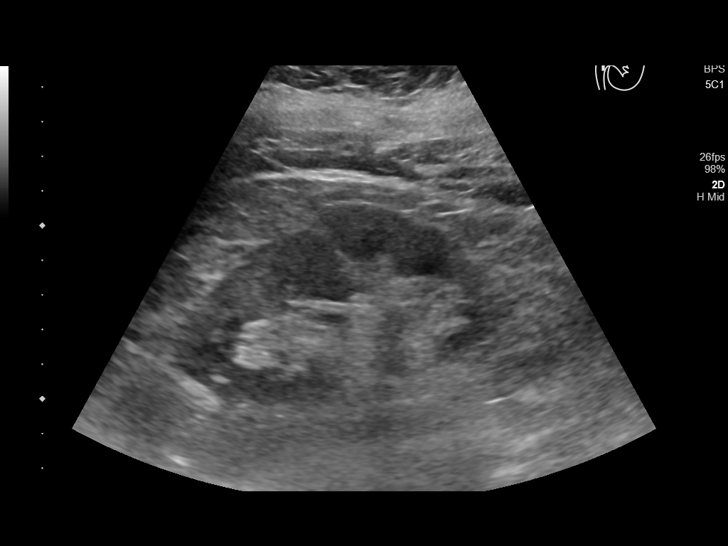
[im 25/40]
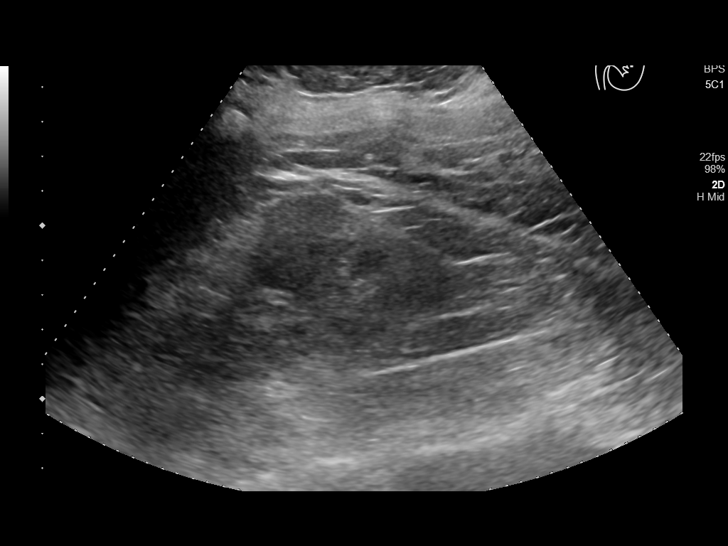
[im 27/40]
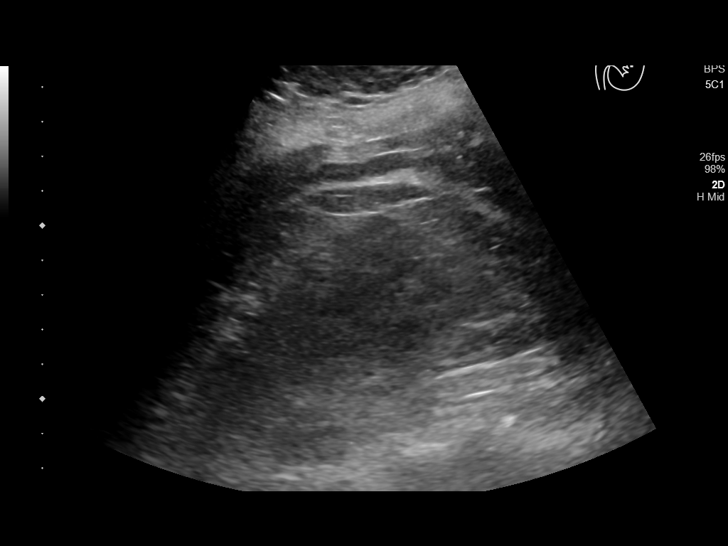
[im 30/40]
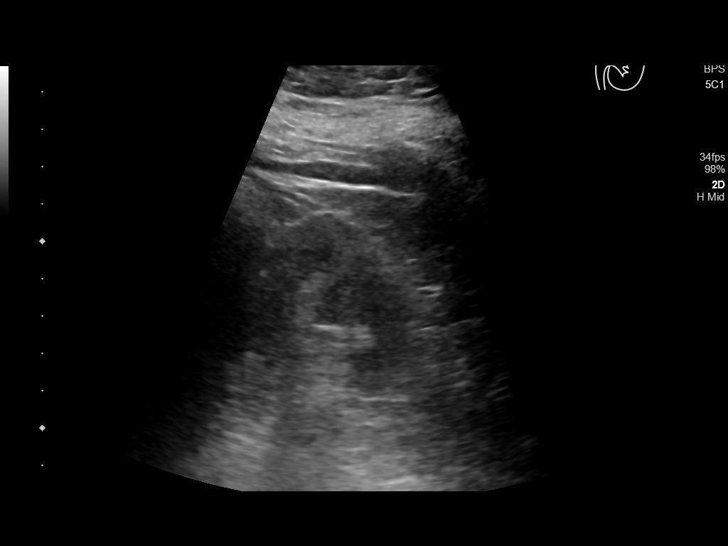
[im 33/40]
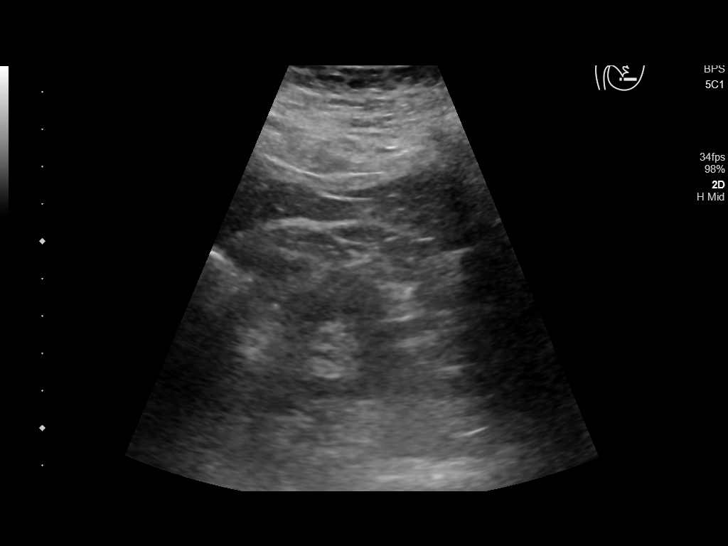
[im 36/40]
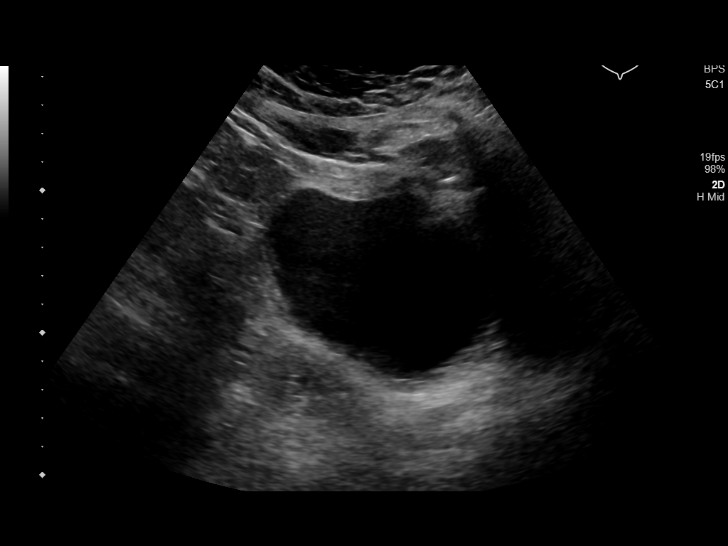
[im 40/40]
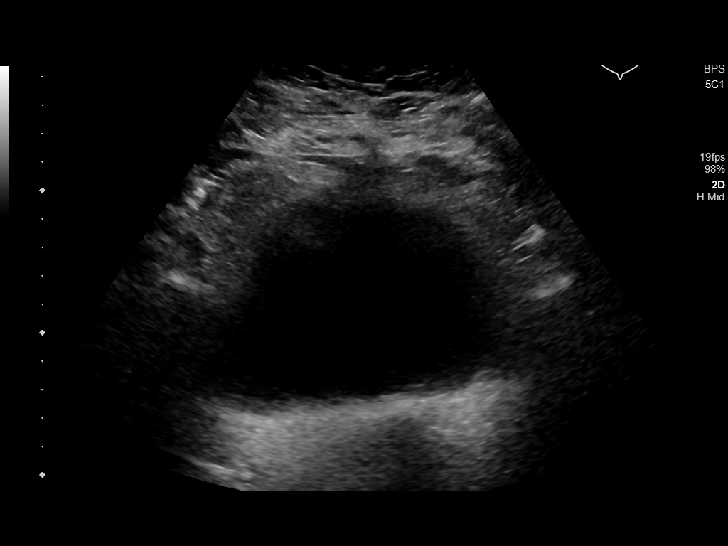

[14 of 25 positions shown; findings below may reference images not displayed]

FINDINGS: Right Kidney:

Renal measurements: 10.2 x 4.3 x 4.7 cm = volume: 110 mL. The
cortical echogenicity is increased. There is no hydronephrosis.

Left Kidney:

Renal measurements: 9.9 x 5.3 x 4.6 cm = volume: 126 mL. The
cortical echogenicity is increased. There is no hydronephrosis.

Bladder:

Appears normal for degree of bladder distention.

Other:

None.
IMPRESSION: 1. No acute abnormality.
2. Echogenic kidneys bilaterally which can be seen in patients with
medical renal disease.

## 2019-07-29 DIAGNOSIS — N2581 Secondary hyperparathyroidism of renal origin: Secondary | ICD-10-CM | POA: Insufficient documentation

## 2019-08-28 ENCOUNTER — Encounter: Payer: Self-pay | Admitting: Podiatry

## 2019-08-28 ENCOUNTER — Other Ambulatory Visit: Payer: Self-pay

## 2019-08-28 ENCOUNTER — Ambulatory Visit (INDEPENDENT_AMBULATORY_CARE_PROVIDER_SITE_OTHER): Payer: Medicare Other | Admitting: Podiatry

## 2019-08-28 DIAGNOSIS — B351 Tinea unguium: Secondary | ICD-10-CM

## 2019-08-28 DIAGNOSIS — I878 Other specified disorders of veins: Secondary | ICD-10-CM | POA: Insufficient documentation

## 2019-08-28 DIAGNOSIS — M79675 Pain in left toe(s): Secondary | ICD-10-CM | POA: Diagnosis not present

## 2019-08-28 DIAGNOSIS — E1159 Type 2 diabetes mellitus with other circulatory complications: Secondary | ICD-10-CM | POA: Diagnosis not present

## 2019-08-28 DIAGNOSIS — M79674 Pain in right toe(s): Secondary | ICD-10-CM | POA: Diagnosis not present

## 2019-08-28 NOTE — Progress Notes (Signed)
Complaint:  Visit Type: Patient returns to my office for continued preventative foot care services. Complaint: Patient states" my nails have grown long and thick and become painful to walk and wear shoes" Patient has been diagnosed with DM with no foot complications. The patient presents for preventative foot care services.  Podiatric Exam: Vascular: dorsalis pedis and posterior tibial pulses are not  palpable bilateral. Capillary return is immediate. Temperature gradient is WNL. Skin turgor WNL  Sensorium: Normal Semmes Weinstein monofilament test left.  LOPS diminished right foot. Normal tactile sensation bilaterally. Nail Exam: Pt has thick disfigured discolored nails with subungual debris noted bilateral entire nail hallux through fifth toenails Ulcer Exam: There is no evidence of ulcer or pre-ulcerative changes or infection. Orthopedic Exam: Muscle tone and strength are WNL. No limitations in general ROM. No crepitus or effusions noted. Foot type and digits show no abnormalities. Bony prominences are unremarkable.Midfoot DJD  B/L.  Rearfoot  ROM diminished.  Swelling left ankle. Skin: No Porokeratosis. No infection or ulcers  Diagnosis:  Onychomycosis, , Pain in right toe, pain in left toes  Venous stasis left aqnkle.  Treatment & Plan Procedures and Treatment: Consent by patient was obtained for treatment procedures.   Debridement of mycotic and hypertrophic toenails, 1 through 5 bilateral and clearing of subungual debris. No ulceration, no infection noted. Encouraged to use compression socks. Return Visit-Office Procedure: Patient instructed to return to the office for a follow up visit 3 months for continued evaluation and treatment.    Gardiner Barefoot DPM

## 2019-09-02 DIAGNOSIS — E113593 Type 2 diabetes mellitus with proliferative diabetic retinopathy without macular edema, bilateral: Secondary | ICD-10-CM | POA: Diagnosis not present

## 2019-09-04 DIAGNOSIS — N184 Chronic kidney disease, stage 4 (severe): Secondary | ICD-10-CM | POA: Diagnosis not present

## 2019-09-04 DIAGNOSIS — E1122 Type 2 diabetes mellitus with diabetic chronic kidney disease: Secondary | ICD-10-CM | POA: Diagnosis not present

## 2019-09-04 DIAGNOSIS — N2581 Secondary hyperparathyroidism of renal origin: Secondary | ICD-10-CM | POA: Diagnosis not present

## 2019-09-04 DIAGNOSIS — I129 Hypertensive chronic kidney disease with stage 1 through stage 4 chronic kidney disease, or unspecified chronic kidney disease: Secondary | ICD-10-CM | POA: Diagnosis not present

## 2019-09-04 DIAGNOSIS — R8290 Unspecified abnormal findings in urine: Secondary | ICD-10-CM | POA: Diagnosis not present

## 2019-09-10 DIAGNOSIS — E1122 Type 2 diabetes mellitus with diabetic chronic kidney disease: Secondary | ICD-10-CM | POA: Diagnosis not present

## 2019-09-10 DIAGNOSIS — R8290 Unspecified abnormal findings in urine: Secondary | ICD-10-CM | POA: Diagnosis not present

## 2019-09-10 DIAGNOSIS — N2581 Secondary hyperparathyroidism of renal origin: Secondary | ICD-10-CM | POA: Diagnosis not present

## 2019-09-10 DIAGNOSIS — I129 Hypertensive chronic kidney disease with stage 1 through stage 4 chronic kidney disease, or unspecified chronic kidney disease: Secondary | ICD-10-CM | POA: Diagnosis not present

## 2019-09-10 DIAGNOSIS — N184 Chronic kidney disease, stage 4 (severe): Secondary | ICD-10-CM | POA: Diagnosis not present

## 2019-09-30 ENCOUNTER — Ambulatory Visit (INDEPENDENT_AMBULATORY_CARE_PROVIDER_SITE_OTHER): Payer: Medicare Other | Admitting: Internal Medicine

## 2019-09-30 ENCOUNTER — Encounter: Payer: Self-pay | Admitting: Internal Medicine

## 2019-09-30 ENCOUNTER — Other Ambulatory Visit: Payer: Self-pay

## 2019-09-30 VITALS — BP 141/74 | HR 70 | Temp 97.7°F | Ht 65.0 in | Wt 194.0 lb

## 2019-09-30 DIAGNOSIS — E559 Vitamin D deficiency, unspecified: Secondary | ICD-10-CM

## 2019-09-30 DIAGNOSIS — N184 Chronic kidney disease, stage 4 (severe): Secondary | ICD-10-CM

## 2019-09-30 DIAGNOSIS — M542 Cervicalgia: Secondary | ICD-10-CM | POA: Diagnosis not present

## 2019-09-30 DIAGNOSIS — E1122 Type 2 diabetes mellitus with diabetic chronic kidney disease: Secondary | ICD-10-CM

## 2019-09-30 DIAGNOSIS — N3000 Acute cystitis without hematuria: Secondary | ICD-10-CM

## 2019-09-30 DIAGNOSIS — E1165 Type 2 diabetes mellitus with hyperglycemia: Secondary | ICD-10-CM

## 2019-09-30 DIAGNOSIS — N1832 Chronic kidney disease, stage 3b: Secondary | ICD-10-CM | POA: Diagnosis not present

## 2019-09-30 DIAGNOSIS — I739 Peripheral vascular disease, unspecified: Secondary | ICD-10-CM

## 2019-09-30 DIAGNOSIS — M1990 Unspecified osteoarthritis, unspecified site: Secondary | ICD-10-CM

## 2019-09-30 DIAGNOSIS — M5416 Radiculopathy, lumbar region: Secondary | ICD-10-CM

## 2019-09-30 DIAGNOSIS — Z794 Long term (current) use of insulin: Secondary | ICD-10-CM

## 2019-09-30 DIAGNOSIS — J320 Chronic maxillary sinusitis: Secondary | ICD-10-CM

## 2019-09-30 MED ORDER — LORATADINE 10 MG PO TABS: 5.0000 mg | ORAL_TABLET | Freq: Every day | ORAL | 11 refills | Status: DC | PRN

## 2019-09-30 MED ORDER — SALINE SPRAY 0.65 % NA SOLN: 2.0000 | Freq: Every day | NASAL | 11 refills | Status: DC | PRN

## 2019-09-30 MED ORDER — INSULIN DETEMIR 100 UNIT/ML ~~LOC~~ SOLN: SUBCUTANEOUS | 11 refills | Status: DC

## 2019-09-30 MED ORDER — INSULIN DETEMIR 100 UNIT/ML FLEXPEN: SUBCUTANEOUS | 11 refills | Status: DC

## 2019-09-30 MED ORDER — INSULIN LISPRO (1 UNIT DIAL) 100 UNIT/ML (KWIKPEN): PEN_INJECTOR | SUBCUTANEOUS | 11 refills | Status: DC

## 2019-09-30 NOTE — Progress Notes (Signed)
Telephone Note  I connected with Vickie Kemp  on 09/30/19 at  2:40 PM EST by a telephone and verified that I am speaking with the correct person using two identifiers.  Location patient: home Location provider:work or home office Persons participating in the virtual visit: patient, provider  I discussed the limitations of evaluation and management by telemedicine and the availability of in person appointments. The patient expressed understanding and agreed to proceed.   HPI: 1. Chronic back pain 8/10 tried vicks, biofreeze back pain x 12-13 years and trouble walking and left left left side hurts like muscle pain from thigh to left knee  [facility] [facility] changes in joints rec MRI ortho spine/NS or PM&R pt wants to hold for now with pandemic 2. DM 2 with CKD 3b/4 chronic increased levemir 10 units in am and 30 qhs and better last A1C 9.4 on humalog 10 tid as well since increased levemir cbg in the am 114 but pm 291, 317, 273  She did have labs 09/10/19 with renal Dr. Abigail Butts  Needs refills DM meds  3. C/o chronic sinus issues reviewed prior imaging with maxillary chronic sinusitis agreeable to try claritin and ns  4. UTI noted 09/10/19 labs will repeat  ROS: See pertinent positives and negatives per HPI.  Past Medical History:  Diagnosis Date  . Coronary artery disease    MI, pci to mid LAD 2014. LHC 80%OM, 60%RCA, 90% LAD  . Diabetes mellitus without complication ([place])   . Hypertension     Past Surgical History:  Procedure Laterality Date  . ACNE CYST REMOVAL  2008   Groin area  . CARDIAC CATHETERIZATION    . CORONARY ANGIOPLASTY      Family History  Problem Relation Age of Onset  . Diabetes Mother   . Heart disease Father     SOCIAL HX:  Used to live in [place]     Current Outpatient Medications:  .  acetaminophen (TYLENOL) 650 MG CR tablet, Take 650 mg by mouth 3 (three) times daily as needed., Disp: , Rfl:  .  amLODipine  (NORVASC) 10 MG tablet, Take 10 mg by mouth daily., Disp: , Rfl:  .  aspirin EC 81 MG tablet, Take 81 mg by mouth daily., Disp: , Rfl:  .  atorvastatin (LIPITOR) 40 MG tablet, Take 40 mg by mouth daily., Disp: , Rfl:  .  furosemide (LASIX) 40 MG tablet, Take 40 mg by mouth., Disp: , Rfl:  .  labetalol (NORMODYNE) 300 MG tablet, Take 300 mg by mouth 2 (two) times daily., Disp: , Rfl:  .  latanoprost (XALATAN) 0.005 % ophthalmic solution, Place 1 drop into the left eye at bedtime., Disp: , Rfl:  .  MAGNESIUM SULFATE PO, Take 500 mg by mouth., Disp: , Rfl:  .  Turmeric (QC TUMERIC COMPLEX PO), Take by mouth., Disp: , Rfl:  .  Vitamin D, Ergocalciferol, (DRISDOL) 1.25 MG (50000 UT) CAPS capsule, Take 50,000 Units by mouth every 7 (seven) days., Disp: , Rfl:  .  Insulin Detemir (LEVEMIR FLEXTOUCH) 100 UNIT/ML Pen, 10 units in am and 30 units qhs with food, Disp: 5 pen, Rfl: 11 .  insulin detemir (LEVEMIR) 100 unit/ml SOLN, 10 units in the am and 30 units qhs, Disp: 10 mL, Rfl: 11 .  insulin lispro (HUMALOG [place]PEN) 100 UNIT/ML KwikPen, Prn based on sugar  If sugar 70-130 0 units  If sugar 131-180 4 units  If sugar 181-240 8 units  If sugar  241-300 10 units  If sugar 301-350 12 units  If sugar 351-400 16 units  If >400 20 units and call doctor, Disp: 15 mL, Rfl: 11 .  loratadine (CLARITIN) 10 MG tablet, Take 0.5 tablets (5 mg total) by mouth daily as needed for allergies., Disp: 15 tablet, Rfl: 11 .  sodium chloride (OCEAN) 0.65 % SOLN nasal spray, Place 2 sprays into both nostrils daily as needed for congestion., Disp: 30 mL, Rfl: 11  EXAM:  VITALS per patient if applicable:  GENERAL: alert, oriented, appears well and in no acute distress  PSYCH/NEURO: pleasant and cooperative, no obvious depression or anxiety, speech and thought processing grossly intact  ASSESSMENT AND PLAN:  Discussed the following assessment and plan:  Lumbar radiculopathy  Hold MRI and specialist referral for now   Consider in future pt declines for now See below   Stage 3b/4 chronic kidney disease - Plan: Comprehensive metabolic panel, Lipid panel, CBC with Differential/Platelet F/u renal saw 09/10/19  Cervicalgia likely due to Arthritis Prn tylenol  Type 2 diabetes mellitus with hyperglycemia and CKD3b/4 - Plan: Comprehensive metabolic panel, Lipid panel, Hemoglobin A1c, insulin lispro (HUMALOG KWIKPEN) SSI and levemir 10 in am and 30 units whs  Monitor cbgs   Chronic maxillary sinusitis - Plan: sodium chloride (OCEAN) 0.65 % SOLN nasal spray, loratadine (CLARITIN) 10 MG tablet  Acute cystitis without hematuria - Plan: Urinalysis, Routine w reflex microscopic, Urine Culture  Vitamin D deficiency - Plan: Vitamin D (25 hydroxy)  HM Flu utd  pna 23 utd  Prevnar, tdap, shingrix  covid 19 vx consider if wanted in future   Hep C neg 06/10/19 Carotid artery plaque b/l  DEXA 11/20/16 osteopenia  Out of pap window Colonoscopy none on file Mammogram 10/26/16 negative, consider in future   07/08/16 lumbar mild L4/5 mild disc narrowing Grade I anterolisthesis L4 and L5 with associated disc space narrowing without evidence of fracture. Arthritis 08/12/14 degenerative changes  CT scan 07/24/16 negative  ABI right 0.64, left 0.62 moderate 05/30/17 moderate PVD 05/25/17 left maxillary sinus disease   -we discussed possible serious and likely etiologies, options for evaluation and workup, limitations of telemedicine visit vs in person visit, treatment, treatment risks and precautions. Pt prefers to treat via telemedicine empirically rather then risking or undertaking an in person visit at this moment. Patient agrees to seek prompt in person care if worsening, new symptoms arise, or if is not improving with treatment.   I discussed the assessment and treatment plan with the patient. The patient was provided an opportunity to ask questions and all were answered. The patient agreed with the plan and  demonstrated an understanding of the instructions.   The patient was advised to call back or seek an in-person evaluation if the symptoms worsen or if the condition fails to improve as anticipated.  Time spent 30 minutes pt care and chart review  Delorise Jackson, MD

## 2019-09-30 NOTE — Patient Instructions (Addendum)
Consider MRI low back and neck  Consider seeing ortho spine or Neurosurgery  -Dr. Remo Lipps Cook/[place] or Dr. Joselyn Arrow    Nasal saline then nasacort or flonase  Consider claritin 1/2 pill at night or zyrtec 1/2 pill or allegra 60 mg at night    Sinusitis, Adult Sinusitis is inflammation of your sinuses. Sinuses are hollow spaces in the bones around your face. Your sinuses are located:  Around your eyes.  In the middle of your forehead.  Behind your nose.  In your cheekbones. Mucus normally drains out of your sinuses. When your nasal tissues become inflamed or swollen, mucus can become trapped or blocked. This allows bacteria, viruses, and fungi to grow, which leads to infection. Most infections of the sinuses are caused by a virus. Sinusitis can develop quickly. It can last for up to 4 weeks (acute) or for more than 12 weeks (chronic). Sinusitis often develops after a cold. What are the causes? This condition is caused by anything that creates swelling in the sinuses or stops mucus from draining. This includes:  Allergies.  Asthma.  Infection from bacteria or viruses.  Deformities or blockages in your nose or sinuses.  Abnormal growths in the nose (nasal polyps).  Pollutants, such as chemicals or irritants in the air.  Infection from fungi (rare). What increases the risk? You are more likely to develop this condition if you:  Have a weak body defense system (immune system).  Do a lot of swimming or diving.  Overuse nasal sprays.  Smoke. What are the signs or symptoms? The main symptoms of this condition are pain and a feeling of pressure around the affected sinuses. Other symptoms include:  Stuffy nose or congestion.  Thick drainage from your nose.  Swelling and warmth over the affected sinuses.  Headache.  Upper toothache.  A cough that may get worse at night.  Extra mucus that collects in the throat or the back of the nose (postnasal  drip).  Decreased sense of smell and taste.  Fatigue.  A fever.  Sore throat.  Bad breath. How is this diagnosed? This condition is diagnosed based on:  Your symptoms.  Your medical history.  A physical exam.  Tests to find out if your condition is acute or chronic. This may include: ? Checking your nose for nasal polyps. ? Viewing your sinuses using a device that has a light (endoscope). ? Testing for allergies or bacteria. ? Imaging tests, such as an MRI or CT scan. In rare cases, a bone biopsy may be done to rule out more serious types of fungal sinus disease. How is this treated? Treatment for sinusitis depends on the cause and whether your condition is chronic or acute.  If caused by a virus, your symptoms should go away on their own within 10 days. You may be given medicines to relieve symptoms. They include: ? Medicines that shrink swollen nasal passages (topical intranasal decongestants). ? Medicines that treat allergies (antihistamines). ? A spray that eases inflammation of the nostrils (topical intranasal corticosteroids). ? Rinses that help get rid of thick mucus in your nose (nasal saline washes).  If caused by bacteria, your health care provider may recommend waiting to see if your symptoms improve. Most bacterial infections will get better without antibiotic medicine. You may be given antibiotics if you have: ? A severe infection. ? A weak immune system.  If caused by narrow nasal passages or nasal polyps, you may need to have surgery. Follow these instructions at home: Medicines  Take, use, or apply over-the-counter and prescription medicines only as told by your health care provider. These may include nasal sprays.  If you were prescribed an antibiotic medicine, take it as told by your health care provider. Do not stop taking the antibiotic even if you start to feel better. Hydrate and humidify   Drink enough fluid to keep your urine pale yellow.  Staying hydrated will help to thin your mucus.  Use a cool mist humidifier to keep the humidity level in your home above 50%.  Inhale steam for 10-15 minutes, 3-4 times a day, or as told by your health care provider. You can do this in the bathroom while a hot shower is running.  Limit your exposure to cool or dry air. Rest  Rest as much as possible.  Sleep with your head raised (elevated).  Make sure you get enough sleep each night. General instructions   Apply a warm, moist washcloth to your face 3-4 times a day or as told by your health care provider. This will help with discomfort.  Wash your hands often with soap and water to reduce your exposure to germs. If soap and water are not available, use hand sanitizer.  Do not smoke. Avoid being around people who are smoking (secondhand smoke).  Keep all follow-up visits as told by your health care provider. This is important. Contact a health care provider if:  You have a fever.  Your symptoms get worse.  Your symptoms do not improve within 10 days. Get help right away if:  You have a severe headache.  You have persistent vomiting.  You have severe pain or swelling around your face or eyes.  You have vision problems.  You develop confusion.  Your neck is stiff.  You have trouble breathing. Summary  Sinusitis is soreness and inflammation of your sinuses. Sinuses are hollow spaces in the bones around your face.  This condition is caused by nasal tissues that become inflamed or swollen. The swelling traps or blocks the flow of mucus. This allows bacteria, viruses, and fungi to grow, which leads to infection.  If you were prescribed an antibiotic medicine, take it as told by your health care provider. Do not stop taking the antibiotic even if you start to feel better.  Keep all follow-up visits as told by your health care provider. This is important. This information is not intended to replace advice given to you by  your health care provider. Make sure you discuss any questions you have with your health care provider. Document Revised: 12/31/2017 Document Reviewed: 12/31/2017 Elsevier Patient Education  [place].   Chronic Kidney Disease, Adult Chronic kidney disease (CKD) occurs when the kidneys become damaged slowly over a long period of time. The kidneys are a pair of organs that do many important jobs in the body, including:  Removing waste and extra fluid from the blood to make urine.  Making hormones that maintain the amount of fluid in tissues and blood vessels.  Maintaining the right amount of fluids and chemicals in the body. A small amount of kidney damage may not cause problems, but a large amount of damage may make it hard or impossible for the kidneys to work the way they should. If steps are not taken to slow down kidney damage or to stop it from getting worse, the kidneys may stop working permanently (end-stage renal disease or ESRD). Most of the time, CKD does not go away, but it can often be controlled.  People who have CKD are usually able to live normal lives. What are the causes? The most common causes of this condition are diabetes and high blood pressure (hypertension). Other causes include:  Heart and blood vessel (cardiovascular) disease.  Kidney diseases, such as: ? Glomerulonephritis. ? Interstitial nephritis. ? Polycystic kidney disease. ? Renal vascular disease.  Diseases that affect the immune system.  Genetic diseases.  Medicines that damage the kidneys, such as anti-inflammatory medicines.  Being around or being in contact with poisonous (toxic) substances.  A kidney or urinary infection that occurs again and again (recurs).  Vasculitis. This is swelling or inflammation of the blood vessels.  A problem with urine flow that may be caused by: ? Cancer. ? Having kidney stones more than one time. ? An enlarged prostate, in males. What increases the  risk? You are more likely to develop this condition if you:  Are older than age 7.  Are female.  Are African-American, Hispanic, Asian, [place], or American [place].  Are a current or former smoker.  Are obese.  Have a family history of kidney disease or failure.  Often take medicines that are damaging to the kidneys. What are the signs or symptoms? Symptoms of this condition include:  Swelling (edema) of the face, legs, ankles, or feet.  Tiredness (lethargy) and having less energy.  Nausea or vomiting.  Confusion or trouble concentrating.  Problems with urination, such as: ? Painful or burning feeling during urination. ? Decreased urine production. ? Frequent urination, especially at night. ? Bloody urine.  Muscle twitches and cramps, especially in the legs.  Shortness of breath.  Weakness.  Loss of appetite.  Metallic taste in the mouth.  Trouble sleeping.  Dry, itchy skin.  A low blood count (anemia).  Pale lining of the eyelids and surface of the eye (conjunctiva). Symptoms develop slowly and may not be obvious until the kidney damage becomes severe. It is possible to have kidney disease for years without having any symptoms. How is this diagnosed? This condition may be diagnosed based on:  Blood tests.  Urine tests.  Imaging tests, such as an ultrasound or CT scan.  A test in which a sample of tissue is removed from the kidneys to be examined under a microscope (kidney biopsy). These test results will help your health care provider determine how serious the CKD is. How is this treated? There is no cure for most cases of this condition, but treatment usually relieves symptoms and prevents or slows the progression of the disease. Treatment may include:  Making diet changes, which may require you to avoid alcohol, salty foods (sodium), and foods that are high in potassium, calcium, and protein.  Medicines: ? To lower blood pressure. ? To  control blood glucose. ? To relieve anemia. ? To relieve swelling. ? To protect your bones. ? To improve the balance of electrolytes in your blood.  Removing toxic waste from the body through types of dialysis, if the kidneys can no longer do their job (kidney failure).  Managing any other conditions that are causing your CKD or making it worse. Follow these instructions at home: Medicines  Take over-the-counter and prescription medicines only as told by your health care provider. The dose of some medicines that you take may need to be adjusted.  Do not take any new medicines unless approved by your health care provider. Many medicines can worsen your kidney damage.  Do not take any vitamin and mineral supplements unless approved by your  health care provider. Many nutritional supplements can worsen your kidney damage. General instructions  Follow your prescribed diet as told by your health care provider.  Do not use any products that contain nicotine or tobacco, such as cigarettes and e-cigarettes. If you need help quitting, ask your health care provider.  Monitor and track your blood pressure at home. Report changes in your blood pressure as told by your health care provider.  If you are being treated for diabetes, monitor and track your blood sugar (blood glucose) levels as told by your health care provider.  Maintain a healthy weight. If you need help with this, ask your health care provider.  Start or continue an exercise plan. Exercise at least 30 minutes a day, 5 days a week.  Keep your immunizations up to date as told by your health care provider.  Keep all follow-up visits as told by your health care provider. This is important. Where to find more information  American Association of Kidney Patients: BombTimer.gl  National Kidney Foundation: www.kidney.[place]: https://mathis.com/  [facility]: www.lifeoptions.org and  www.kidneyschool.org Contact a health care provider if:  Your symptoms get worse.  You develop new symptoms. Get help right away if:  You develop symptoms of ESRD, which include: ? Headaches. ? Numbness in the hands or feet. ? Easy bruising. ? Frequent hiccups. ? Chest pain. ? Shortness of breath. ? Lack of menstruation, in women.  You have a fever.  You have decreased urine production.  You have pain or bleeding when you urinate. Summary  Chronic kidney disease (CKD) occurs when the kidneys become damaged slowly over a long period of time.  The most common causes of this condition are diabetes and high blood pressure (hypertension).  There is no cure for most cases of this condition, but treatment usually relieves symptoms and prevents or slows the progression of the disease. Treatment may include a combination of medicines and lifestyle changes. This information is not intended to replace advice given to you by your health care provider. Make sure you discuss any questions you have with your health care provider. Document Revised: 07/13/2017 Document Reviewed: 09/07/2016 Elsevier Patient Education  2020 [place] for Chronic Kidney Disease When your kidneys are not working well, they cannot remove waste and excess substances from your blood as effectively as they did before. This can lead to a buildup and imbalance of these substances, which can worsen kidney damage and affect how your body functions. Certain foods lead to a buildup of these substances in the body. By changing your diet as recommended by your diet and nutrition specialist (dietitian) or health care provider, you could help prevent further kidney damage and delay or prevent the need for dialysis. What are tips for following this plan? General instructions   Work with your health care provider and dietitian to develop a meal plan that is right for you. Foods you can eat, limit, or avoid will  be different for each person depending on the stage of kidney disease and any other existing health conditions.  Talk with your health care provider about whether you should take a vitamin and mineral supplement.  Use standard measuring cups and spoons to measure servings of foods. Use a kitchen scale to measure portions of protein foods.  If directed by your health care provider, avoid drinking too much fluid. Measure and count all liquids, including water, ice, soups, flavored gelatin, and frozen desserts such as popsicles  or ice cream. Reading food labels  Check the amount of sodium in foods. Choose foods that have less than 300 milligrams (mg) per serving.  Check the ingredient list for phosphorus or potassium-based additives or preservatives.  Check the amount of saturated and trans fat. Limit or avoid these fats as told by your dietitian. Shopping  Avoid buying foods that are: ? Processed, frozen, or prepackaged. ? Calcium-enriched or fortified.  Do not buy foods that have salt or sodium listed among the first five ingredients.  Do not buy canned vegetables. Cooking  Replace animal proteins, such as meat, fish, eggs, or dairy, with plant proteins from beans, nuts, and soy. ? Use soy milk instead of cow's milk. ? Add beans or tofu to soups, casseroles, or pasta dishes instead of meat.  Soak vegetables, such as potatoes, before cooking to reduce potassium. To do this: ? Peel and cut into small pieces. ? Soak in warm water for at least 2 hours. For every 1 cup of vegetables, use 10 cups of water. ? Drain and rinse with warm water. ? Boil for at least 5 minutes. Meal planning  Limit the amount of protein from plant and animal sources you eat each day.  Do not add salt to food when cooking or before eating.  Eat meals and snacks at around the same time each day. If you have diabetes:  If you have diabetes (diabetes mellitus) and chronic kidney disease, it is important to  keep your blood glucose in the target range recommended by your health care provider. Follow your diabetes management plan. This may include: ? Checking your blood glucose regularly. ? Taking oral medicines, insulin, or both. ? Exercising for at least 30 minutes on 5 or more days each week, or as told by your health care provider. ? Tracking how many servings of carbohydrates you eat at each meal.  You may be given specific guidelines on how much of certain foods and nutrients you may eat, depending on your stage of kidney disease and whether you have high blood pressure (hypertension). Follow your meal plan as told by your dietitian. What nutrients should be limited? The items listed are not a complete list. Talk with your dietitian about what dietary choices are best for you. Potassium Potassium affects how steadily your heart beats. If too much potassium builds up in your blood, it can cause an irregular heartbeat or even a heart attack. You may need to eat less potassium, depending on your blood potassium levels and the stage of kidney disease. Talk to your dietitian about how much potassium you may have each day. You may need to limit or avoid foods that are high in potassium, such as:  Milk and soy milk.  Fruits, such as bananas, papaya, apricots, nectarines, melon, prunes, raisins, kiwi, and oranges.  Vegetables, such as potatoes, sweet potatoes, yams, tomatoes, leafy greens, beets, okra, avocado, pumpkin, and winter squash.  White and lima beans. Phosphorus Phosphorus is a mineral found in your bones. A balance between calcium and phosphorous is needed to build and maintain healthy bones. Too much phosphorus pulls calcium from your bones. This can make your bones weak and more likely to break. Too much phosphorus can also make your skin itch. You may need to eat less phosphorus depending on your blood phosphorus levels and the stage of kidney disease. Talk to your dietitian about how  much potassium you may have each day. You may need to take medicine to lower your blood  phosphorus levels if diet changes do not help. You may need to limit or avoid foods that are high in phosphorus, such as:  Milk and dairy products.  Dried beans and peas.  Tofu, soy milk, and other soy-based meat replacements.  Colas.  Nuts and peanut butter.  Meat, poultry, and fish.  Bran cereals and oatmeals. Protein Protein helps you to make and keep muscle. It also helps in the repair of your body's cells and tissues. One of the natural breakdown products of protein is a waste product called urea. When your kidneys are not working properly, they cannot remove wastes, such as urea, like they did before you developed chronic kidney disease. Reducing how much protein you eat can help prevent a buildup of urea in your blood. Depending on your stage of kidney disease, you may need to limit foods that are high in protein. Sources of animal protein include:  Meat (all types).  Fish and seafood.  Poultry.  Eggs.  Dairy. Other protein foods include:  Beans and legumes.  Nuts and nut butter.  Soy and tofu. Sodium Sodium, which is found in salt, helps maintain a healthy balance of fluids in your body. Too much sodium can increase your blood pressure and have a negative effect on the function of your heart and lungs. Too much sodium can also cause your body to retain too much fluid, making your kidneys work harder. Most people should have less than 2,300 milligrams (mg) of sodium each day. If you have hypertension, you may need to limit your sodium to 1,500 mg each day. Talk to your dietitian about how much sodium you may have each day. You may need to limit or avoid foods that are high in sodium, such as:  Salt seasonings.  Soy sauce.  Cured and processed meats.  Salted crackers and snack foods.  Fast food.  Canned soups and most canned foods.  Pickled foods.  Vegetable  juice.  Boxed mixes or ready-to-eat boxed meals and side dishes.  Bottled dressings, sauces, and marinades. Summary  Chronic kidney disease can lead to a buildup and imbalance of waste and excess substances in the body. Certain foods lead to a buildup of these substances. By adjusting your intake of these foods, you could help prevent more kidney damage and delay or prevent the need for dialysis.  Food adjustments are different for each person with chronic kidney disease. Work with a dietitian to set up nutrient goals and a meal plan that is right for you.  If you have diabetes and chronic kidney disease, it is important to keep your blood glucose in the target range recommended by your health care provider. This information is not intended to replace advice given to you by your health care provider. Make sure you discuss any questions you have with your health care provider. Document Revised: 11/21/2018 Document Reviewed: 07/26/2016 Elsevier Patient Education  2020 [place].  Back Exercises The following exercises strengthen the muscles that help to support the trunk and back. They also help to keep the lower back flexible. Doing these exercises can help to prevent back pain or lessen existing pain.  If you have back pain or discomfort, try doing these exercises 2-3 times each day or as told by your health care provider.  As your pain improves, do them once each day, but increase the number of times that you repeat the steps for each exercise (do more repetitions).  To prevent the recurrence of back pain, continue to  do these exercises once each day or as told by your health care provider. Do exercises exactly as told by your health care provider and adjust them as directed. It is normal to feel mild stretching, pulling, tightness, or discomfort as you do these exercises, but you should stop right away if you feel sudden pain or your pain gets worse. Exercises Single knee to  chest Repeat these steps 3-5 times for each leg: 1. Lie on your back on a firm bed or the floor with your legs extended. 2. Bring one knee to your chest. Your other leg should stay extended and in contact with the floor. 3. Hold your knee in place by grabbing your knee or thigh with both hands and hold. 4. Pull on your knee until you feel a gentle stretch in your lower back or buttocks. 5. Hold the stretch for 10-30 seconds. 6. Slowly release and straighten your leg. Pelvic tilt Repeat these steps 5-10 times: 1. Lie on your back on a firm bed or the floor with your legs extended. 2. Bend your knees so they are pointing toward the ceiling and your feet are flat on the floor. 3. Tighten your lower abdominal muscles to press your lower back against the floor. This motion will tilt your pelvis so your tailbone points up toward the ceiling instead of pointing to your feet or the floor. 4. With gentle tension and even breathing, hold this position for 5-10 seconds. Cat-cow Repeat these steps until your lower back becomes more flexible: 1. Get into a hands-and-knees position on a firm surface. Keep your hands under your shoulders, and keep your knees under your hips. You may place padding under your knees for comfort. 2. Let your head hang down toward your chest. Contract your abdominal muscles and point your tailbone toward the floor so your lower back becomes rounded like the back of a cat. 3. Hold this position for 5 seconds. 4. Slowly lift your head, let your abdominal muscles relax and point your tailbone up toward the ceiling so your back forms a sagging arch like the back of a cow. 5. Hold this position for 5 seconds.  Press-ups Repeat these steps 5-10 times: 1. Lie on your abdomen (face-down) on the floor. 2. Place your palms near your head, about shoulder-width apart. 3. Keeping your back as relaxed as possible and keeping your hips on the floor, slowly straighten your arms to raise the  top half of your body and lift your shoulders. Do not use your back muscles to raise your upper torso. You may adjust the placement of your hands to make yourself more comfortable. 4. Hold this position for 5 seconds while you keep your back relaxed. 5. Slowly return to lying flat on the floor.  Bridges Repeat these steps 10 times: 1. Lie on your back on a firm surface. 2. Bend your knees so they are pointing toward the ceiling and your feet are flat on the floor. Your arms should be flat at your sides, next to your body. 3. Tighten your buttocks muscles and lift your buttocks off the floor until your waist is at almost the same height as your knees. You should feel the muscles working in your buttocks and the back of your thighs. If you do not feel these muscles, slide your feet 1-2 inches farther away from your buttocks. 4. Hold this position for 3-5 seconds. 5. Slowly lower your hips to the starting position, and allow your buttocks muscles to relax  completely. If this exercise is too easy, try doing it with your arms crossed over your chest. Abdominal crunches Repeat these steps 5-10 times: 1. Lie on your back on a firm bed or the floor with your legs extended. 2. Bend your knees so they are pointing toward the ceiling and your feet are flat on the floor. 3. Cross your arms over your chest. 4. Tip your chin slightly toward your chest without bending your neck. 5. Tighten your abdominal muscles and slowly raise your trunk (torso) high enough to lift your shoulder blades a tiny bit off the floor. Avoid raising your torso higher than that because it can put too much stress on your low back and does not help to strengthen your abdominal muscles. 6. Slowly return to your starting position. Back lifts Repeat these steps 5-10 times: 1. Lie on your abdomen (face-down) with your arms at your sides, and rest your forehead on the floor. 2. Tighten the muscles in your legs and your  buttocks. 3. Slowly lift your chest off the floor while you keep your hips pressed to the floor. Keep the back of your head in line with the curve in your back. Your eyes should be looking at the floor. 4. Hold this position for 3-5 seconds. 5. Slowly return to your starting position. Contact a health care provider if:  Your back pain or discomfort gets much worse when you do an exercise.  Your worsening back pain or discomfort does not lessen within 2 hours after you exercise. If you have any of these problems, stop doing these exercises right away. Do not do them again unless your health care provider says that you can. Get help right away if:  You develop sudden, severe back pain. If this happens, stop doing the exercises right away. Do not do them again unless your health care provider says that you can. This information is not intended to replace advice given to you by your health care provider. Make sure you discuss any questions you have with your health care provider. Document Revised: 12/05/2018 Document Reviewed: 05/02/2018 Elsevier Patient Education  [place].  Neck Exercises Ask your health care provider which exercises are safe for you. Do exercises exactly as told by your health care provider and adjust them as directed. It is normal to feel mild stretching, pulling, tightness, or discomfort as you do these exercises. Stop right away if you feel sudden pain or your pain gets worse. Do not begin these exercises until told by your health care provider. Neck exercises can be important for many reasons. They can improve strength and maintain flexibility in your neck, which will help your upper back and prevent neck pain. Stretching exercises Rotation neck stretching  1. Sit in a chair or stand up. 2. Place your feet flat on the floor, shoulder width apart. 3. Slowly turn your head (rotate) to the right until a slight stretch is felt. Turn it all the way to the right so  you can look over your right shoulder. Do not tilt or tip your head. 4. Hold this position for 10-30 seconds. 5. Slowly turn your head (rotate) to the left until a slight stretch is felt. Turn it all the way to the left so you can look over your left shoulder. Do not tilt or tip your head. 6. Hold this position for 10-30 seconds. Repeat __________ times. Complete this exercise __________ times a day. Neck retraction 1. Sit in a sturdy chair or stand  up. 2. Look straight ahead. Do not bend your neck. 3. Use your fingers to push your chin backward (retraction). Do not bend your neck for this movement. Continue to face straight ahead. If you are doing the exercise properly, you will feel a slight sensation in your throat and a stretch at the back of your neck. 4. Hold the stretch for 1-2 seconds. Repeat __________ times. Complete this exercise __________ times a day. Strengthening exercises Neck press 1. Lie on your back on a firm bed or on the floor with a pillow under your head. 2. Use your neck muscles to push your head down on the pillow and straighten your spine. 3. Hold the position as well as you can. Keep your head facing up (in a neutral position) and your chin tucked. 4. Slowly count to 5 while holding this position. Repeat __________ times. Complete this exercise __________ times a day. Isometrics These are exercises in which you strengthen the muscles in your neck while keeping your neck still (isometrics). 1. Sit in a supportive chair and place your hand on your forehead. 2. Keep your head and face facing straight ahead. Do not flex or extend your neck while doing isometrics. 3. Push forward with your head and neck while pushing back with your hand. Hold for 10 seconds. 4. Do the sequence again, this time putting your hand against the back of your head. Use your head and neck to push backward against the hand pressure. 5. Finally, do the same exercise on either side of your head,  pushing sideways against the pressure of your hand. Repeat __________ times. Complete this exercise __________ times a day. Prone head lifts 1. Lie face-down (prone position), resting on your elbows so that your chest and upper back are raised. 2. Start with your head facing downward, near your chest. Position your chin either on or near your chest. 3. Slowly lift your head upward. Lift until you are looking straight ahead. Then continue lifting your head as far back as you can comfortably stretch. 4. Hold your head up for 5 seconds. Then slowly lower it to your starting position. Repeat __________ times. Complete this exercise __________ times a day. Supine head lifts 1. Lie on your back (supine position), bending your knees to point to the ceiling and keeping your feet flat on the floor. 2. Lift your head slowly off the floor, raising your chin toward your chest. 3. Hold for 5 seconds. Repeat __________ times. Complete this exercise __________ times a day. Scapular retraction 1. Stand with your arms at your sides. Look straight ahead. 2. Slowly pull both shoulders (scapulae) backward and downward (retraction) until you feel a stretch between your shoulder blades in your upper back. 3. Hold for 10-30 seconds. 4. Relax and repeat. Repeat __________ times. Complete this exercise __________ times a day. Contact a health care provider if:  Your neck pain or discomfort gets much worse when you do an exercise.  Your neck pain or discomfort does not improve within 2 hours after you exercise. If you have any of these problems, stop exercising right away. Do not do the exercises again unless your health care provider says that you can. Get help right away if:  You develop sudden, severe neck pain. If this happens, stop exercising right away. Do not do the exercises again unless your health care provider says that you can. This information is not intended to replace advice given to you by your  health care provider. Make sure  you discuss any questions you have with your health care provider. Document Revised: 05/29/2018 Document Reviewed: 05/29/2018 Elsevier Patient Education  [place].  Radicular Pain Radicular pain is a type of pain that spreads from your back or neck along a spinal nerve. Spinal nerves are nerves that leave the spinal cord and go to the muscles. Radicular pain is sometimes called radiculopathy, radiculitis, or a pinched nerve. When you have this type of pain, you may also have weakness, numbness, or tingling in the area of your body that is supplied by the nerve. The pain may feel sharp and burning. Depending on which spinal nerve is affected, the pain may occur in the:  Neck area (cervical radicular pain). You may also feel pain, numbness, weakness, or tingling in the arms.  Mid-spine area (thoracic radicular pain). You would feel this pain in the back and chest. This type is rare.  Lower back area (lumbar radicular pain). You would feel this pain as low back pain. You may feel pain, numbness, weakness, or tingling in the buttocks or legs. Sciatica is a type of lumbar radicular pain that shoots down the back of the leg. Radicular pain occurs when one of the spinal nerves becomes irritated or squeezed (compressed). It is often caused by something pushing on a spinal nerve, such as one of the bones of the spine (vertebrae) or one of the round cushions between vertebrae (intervertebral disks). This can result from:  An injury.  Wear and tear or aging of a disk.  The growth of a bone spur that pushes on the nerve. Radicular pain often goes away when you follow instructions from your health care provider for relieving pain at home. Follow these instructions at home: Managing pain      If directed, put ice on the affected area: ? Put ice in a plastic bag. ? Place a towel between your skin and the bag. ? Leave the ice on for 20 minutes, 2-3 times a  day.  If directed, apply heat to the affected area as often as told by your health care provider. Use the heat source that your health care provider recommends, such as a moist heat pack or a heating pad. ? Place a towel between your skin and the heat source. ? Leave the heat on for 20-30 minutes. ? Remove the heat if your skin turns bright red. This is especially important if you are unable to feel pain, heat, or cold. You may have a greater risk of getting burned. Activity   Do not sit or rest in bed for long periods of time.  Try to stay as active as possible. Ask your health care provider what type of exercise or activity is best for you.  Avoid activities that make your pain worse, such as bending and lifting.  Do not lift anything that is heavier than 10 lb (4.5 kg), or the limit that you are told, until your health care provider says that it is safe.  Practice using proper technique when lifting items. Proper lifting technique involves bending your knees and rising up.  Do strength and range-of-motion exercises only as told by your health care provider or physical therapist. General instructions  Take over-the-counter and prescription medicines only as told by your health care provider.  Pay attention to any changes in your symptoms.  Keep all follow-up visits as told by your health care provider. This is important. ? Your health care provider may send you to a physical therapist  to help with this pain. Contact a health care provider if:  Your pain and other symptoms get worse.  Your pain medicine is not helping.  Your pain has not improved after a few weeks of home care.  You have a fever. Get help right away if:  You have severe pain, weakness, or numbness.  You have difficulty with bladder or bowel control. Summary  Radicular pain is a type of pain that spreads from your back or neck along a spinal nerve.  When you have radicular pain, you may also have  weakness, numbness, or tingling in the area of your body that is supplied by the nerve.  The pain may feel sharp or burning.  Radicular pain may be treated with ice, heat, medicines, or physical therapy. This information is not intended to replace advice given to you by your health care provider. Make sure you discuss any questions you have with your health care provider. Document Revised: 02/12/2018 Document Reviewed: 02/12/2018 Elsevier Patient Education  [place].

## 2019-10-01 NOTE — Telephone Encounter (Signed)
Pt scheduled labs on 10/06/19. Pt said her prescription for levemir is wrong. She said she normally get the pre-filled pens but they gave her the solution. The pharmacy would not take them back. Please advise.

## 2019-10-01 NOTE — Telephone Encounter (Signed)
Please advise 

## 2019-10-02 ENCOUNTER — Other Ambulatory Visit: Payer: Self-pay | Admitting: Internal Medicine

## 2019-10-02 ENCOUNTER — Telehealth: Payer: Self-pay | Admitting: Internal Medicine

## 2019-10-02 ENCOUNTER — Encounter: Payer: Self-pay | Admitting: Internal Medicine

## 2019-10-02 DIAGNOSIS — Z794 Long term (current) use of insulin: Secondary | ICD-10-CM

## 2019-10-02 DIAGNOSIS — I739 Peripheral vascular disease, unspecified: Secondary | ICD-10-CM | POA: Insufficient documentation

## 2019-10-02 DIAGNOSIS — E1165 Type 2 diabetes mellitus with hyperglycemia: Secondary | ICD-10-CM

## 2019-10-02 MED ORDER — LEVEMIR FLEXTOUCH 100 UNIT/ML ~~LOC~~ SOPN: PEN_INJECTOR | SUBCUTANEOUS | 11 refills | Status: DC

## 2019-10-02 NOTE — Telephone Encounter (Signed)
Called patient's pharmacy and spoke with [facility]. He states they do have the pens ready and he can see about taking the vials back if it is sealed. He said he could offer no guarantees.   Called and informed the patient of this. She states she will take the vials and paperwork back to the pharmacy.

## 2019-10-02 NOTE — Telephone Encounter (Signed)
Call pharmacy solution was in computer so this was filled but resent as flexpen  See what they can do pt wants flex pen   Sent humalog flex pen via fax call in and make sure pharmacy has this rx too   Call pt and let know   Thanks [place]

## 2019-10-06 ENCOUNTER — Other Ambulatory Visit: Payer: Medicare Other

## 2019-10-15 ENCOUNTER — Other Ambulatory Visit (INDEPENDENT_AMBULATORY_CARE_PROVIDER_SITE_OTHER): Payer: Medicare Other

## 2019-10-15 ENCOUNTER — Other Ambulatory Visit: Payer: Self-pay

## 2019-10-15 DIAGNOSIS — N184 Chronic kidney disease, stage 4 (severe): Secondary | ICD-10-CM | POA: Diagnosis not present

## 2019-10-15 DIAGNOSIS — E559 Vitamin D deficiency, unspecified: Secondary | ICD-10-CM | POA: Diagnosis not present

## 2019-10-15 DIAGNOSIS — E1165 Type 2 diabetes mellitus with hyperglycemia: Secondary | ICD-10-CM | POA: Diagnosis not present

## 2019-10-15 DIAGNOSIS — M1990 Unspecified osteoarthritis, unspecified site: Secondary | ICD-10-CM | POA: Diagnosis not present

## 2019-10-15 DIAGNOSIS — N3000 Acute cystitis without hematuria: Secondary | ICD-10-CM | POA: Diagnosis not present

## 2019-10-15 DIAGNOSIS — E1122 Type 2 diabetes mellitus with diabetic chronic kidney disease: Secondary | ICD-10-CM | POA: Diagnosis not present

## 2019-10-15 DIAGNOSIS — Z794 Long term (current) use of insulin: Secondary | ICD-10-CM

## 2019-10-15 DIAGNOSIS — M5416 Radiculopathy, lumbar region: Secondary | ICD-10-CM

## 2019-10-15 DIAGNOSIS — I739 Peripheral vascular disease, unspecified: Secondary | ICD-10-CM

## 2019-10-15 DIAGNOSIS — N1832 Chronic kidney disease, stage 3b: Secondary | ICD-10-CM

## 2019-10-15 DIAGNOSIS — M542 Cervicalgia: Secondary | ICD-10-CM

## 2019-10-15 LAB — LIPID PANEL
Cholesterol: 83 mg/dL (ref 0–200)
HDL: 25.5 mg/dL — ABNORMAL LOW (ref >39.00)
LDL Cholesterol: 43 mg/dL (ref 0–99)
NonHDL: 57.64
Total CHOL/HDL Ratio: 3
Triglycerides: 72 mg/dL (ref 0.0–149.0)
VLDL: 14.4 mg/dL (ref 0.0–40.0)

## 2019-10-15 LAB — HEPATIC FUNCTION PANEL
ALT: 10 U/L (ref 0–35)
AST: 16 U/L (ref 0–37)
Albumin: 4 g/dL (ref 3.5–5.2)
Alkaline Phosphatase: 111 U/L (ref 39–117)
Bilirubin, Direct: 0.1 mg/dL (ref 0.0–0.3)
Total Bilirubin: 0.5 mg/dL (ref 0.2–1.2)
Total Protein: 7.2 g/dL (ref 6.0–8.3)

## 2019-10-15 LAB — HEMOGLOBIN A1C: Hgb A1c MFr Bld: 9.1 % — ABNORMAL HIGH (ref 4.6–6.5)

## 2019-10-15 LAB — VITAMIN D 25 HYDROXY (VIT D DEFICIENCY, FRACTURES): VITD: 54.23 ng/mL (ref 30.00–100.00)

## 2019-10-17 ENCOUNTER — Other Ambulatory Visit: Payer: Self-pay | Admitting: Internal Medicine

## 2019-10-17 ENCOUNTER — Other Ambulatory Visit: Payer: Self-pay

## 2019-10-17 DIAGNOSIS — E1165 Type 2 diabetes mellitus with hyperglycemia: Secondary | ICD-10-CM

## 2019-10-17 DIAGNOSIS — N184 Chronic kidney disease, stage 4 (severe): Secondary | ICD-10-CM | POA: Diagnosis not present

## 2019-10-17 DIAGNOSIS — E1140 Type 2 diabetes mellitus with diabetic neuropathy, unspecified: Secondary | ICD-10-CM | POA: Diagnosis not present

## 2019-10-17 DIAGNOSIS — I129 Hypertensive chronic kidney disease with stage 1 through stage 4 chronic kidney disease, or unspecified chronic kidney disease: Secondary | ICD-10-CM | POA: Diagnosis not present

## 2019-10-17 DIAGNOSIS — N2581 Secondary hyperparathyroidism of renal origin: Secondary | ICD-10-CM | POA: Diagnosis not present

## 2019-10-17 DIAGNOSIS — E1122 Type 2 diabetes mellitus with diabetic chronic kidney disease: Secondary | ICD-10-CM | POA: Diagnosis not present

## 2019-10-17 DIAGNOSIS — N3090 Cystitis, unspecified without hematuria: Secondary | ICD-10-CM | POA: Insufficient documentation

## 2019-10-17 DIAGNOSIS — N1832 Chronic kidney disease, stage 3b: Secondary | ICD-10-CM

## 2019-10-17 DIAGNOSIS — Z794 Long term (current) use of insulin: Secondary | ICD-10-CM

## 2019-10-17 DIAGNOSIS — N3000 Acute cystitis without hematuria: Secondary | ICD-10-CM

## 2019-10-17 LAB — URINALYSIS, ROUTINE W REFLEX MICROSCOPIC
Bilirubin Urine: NEGATIVE
Glucose, UA: NEGATIVE
Hgb urine dipstick: NEGATIVE
Hyaline Cast: NONE SEEN /LPF
Ketones, ur: NEGATIVE
Nitrite: NEGATIVE
Protein, ur: NEGATIVE
RBC / HPF: NONE SEEN /HPF (ref 0–2)
Specific Gravity, Urine: 1.009 (ref 1.001–1.03)
pH: 7.5 (ref 5.0–8.0)

## 2019-10-17 LAB — URINE CULTURE
MICRO NUMBER:: 10209663
SPECIMEN QUALITY:: ADEQUATE

## 2019-10-17 MED ORDER — LEVEMIR FLEXTOUCH 100 UNIT/ML ~~LOC~~ SOPN: PEN_INJECTOR | SUBCUTANEOUS | 11 refills | Status: DC

## 2019-10-17 MED ORDER — CIPROFLOXACIN HCL 250 MG PO TABS: 250.0000 mg | ORAL_TABLET | Freq: Two times a day (BID) | ORAL | 0 refills | Status: DC

## 2019-10-17 NOTE — Assessment & Plan Note (Signed)
Empiric cipro .  Avoid septra iven decreased GFR.  Culture is still pendin g

## 2019-10-17 NOTE — Progress Notes (Signed)
She appears to have a UTI but the culture is still pending.  Since the weekend is coming I prefer to treat her ,  and if the culture suggests that an alternative antibiotic is needed,  we will change it and notify her.  Her other labs indicate need for reater control of diabetes.  Follow up with dr Aundra Dubin

## 2019-10-24 ENCOUNTER — Other Ambulatory Visit: Payer: Self-pay | Admitting: Cardiology

## 2019-10-24 ENCOUNTER — Ambulatory Visit (INDEPENDENT_AMBULATORY_CARE_PROVIDER_SITE_OTHER): Payer: Medicare Other | Admitting: Cardiology

## 2019-10-24 ENCOUNTER — Other Ambulatory Visit: Payer: Self-pay

## 2019-10-24 ENCOUNTER — Encounter: Payer: Self-pay | Admitting: Cardiology

## 2019-10-24 VITALS — BP 138/62 | HR 66 | Ht 64.0 in | Wt 189.1 lb

## 2019-10-24 DIAGNOSIS — I10 Essential (primary) hypertension: Secondary | ICD-10-CM

## 2019-10-24 DIAGNOSIS — I340 Nonrheumatic mitral (valve) insufficiency: Secondary | ICD-10-CM | POA: Diagnosis not present

## 2019-10-24 DIAGNOSIS — G4733 Obstructive sleep apnea (adult) (pediatric): Secondary | ICD-10-CM | POA: Diagnosis not present

## 2019-10-24 DIAGNOSIS — I25119 Atherosclerotic heart disease of native coronary artery with unspecified angina pectoris: Secondary | ICD-10-CM | POA: Diagnosis not present

## 2019-10-24 MED ORDER — ATORVASTATIN CALCIUM 40 MG PO TABS: 40.0000 mg | ORAL_TABLET | Freq: Every day | ORAL | 0 refills | Status: DC

## 2019-10-24 NOTE — Progress Notes (Signed)
Cardiology Office Note:    Date:  10/24/2019   ID:  Vickie Kemp, DOB 01/29/1941, MRN [id number]  PCP:  McLean-Scocuzza, Nino Glow, MD  Cardiologist:  Kate Sable, MD  Electrophysiologist:  None   Referring MD: Jodelle Green, FNP   Chief Complaint  Patient presents with  . office visit    Pt states swelling in Left leg, ankle and foot/ fatigue and dizziness when getting up out of bed or laying down in bed. Meds verbally reviewed w/ pt.    History of Present Illness:    Vickie Kemp is a 79 y.o. female with a hx of hypertension, OSA, obesity, CAD/MI (status post DES to mLAD 04/2013 [facility] [place]  She presents today for follow-up. She was originally seen to establish care after recently moving to [place] from [place].  Per records, angiogram in 2014 showed 90% mid LAD lesion, 80% OM, 60% RCA.   Echocardiogram on 06/2019 showed normal systolic function with EF 60 to 00%, grade 1 diastolic dysfunction, mild to moderate MR.  She still states feeling fatigued during the day, short of breath with walking.  Walking causes her to be short of breath, needing to take frequent breaks in her walks.  She denied symptoms of chest pain.  She was advised to be compliant with CPAP as this may worsen symptoms of shortness of breath. patient is still not compliant with CPAP machine.  States her CPAP machine is old, she has not cleaned it and also bulky/cumbersome.    Past Medical History:  Diagnosis Date  . Coronary artery disease    MI, pci to mid LAD 2014. LHC 80%OM, 60%RCA, 90% LAD  . Diabetes mellitus without complication ([place])   . Hypertension     Past Surgical History:  Procedure Laterality Date  . ACNE CYST REMOVAL  2008   Groin area  . CARDIAC CATHETERIZATION    . CORONARY ANGIOPLASTY      Current Medications: Current Meds  Medication Sig  . acetaminophen (TYLENOL) 650 MG CR tablet Take 650 mg by mouth 3 (three) times daily  as needed.  [name] amLODipine (NORVASC) 10 MG tablet Take 10 mg by mouth daily.  [name] aspirin EC 81 MG tablet Take 81 mg by mouth daily.  [name] atorvastatin (LIPITOR) 40 MG tablet Take 40 mg by mouth daily.  . ciprofloxacin (CIPRO) 250 MG tablet Take 1 tablet (250 mg total) by mouth 2 (two) times daily.  . furosemide (LASIX) 40 MG tablet Take 40 mg by mouth.  . insulin detemir (LEVEMIR FLEXTOUCH) 100 UNIT/ML FlexPen 15  units in am and 30 units qhs with food  . insulin lispro (HUMALOG KWIKPEN) 100 UNIT/ML KwikPen Prn based on sugar  If sugar 70-130 0 units  If sugar 131-180 4 units  If sugar 181-240 8 units  If sugar 241-300 10 units  If sugar 301-350 12 units  If sugar 351-400 16 units  If >400 20 units and call doctor  . labetalol (NORMODYNE) 300 MG tablet Take 300 mg by mouth 2 (two) times daily.  [name] latanoprost (XALATAN) 0.005 % ophthalmic solution Place 1 drop into the left eye at bedtime.  [name] loratadine (CLARITIN) 10 MG tablet Take 0.5 tablets (5 mg total) by mouth daily as needed for allergies.  [name] MAGNESIUM SULFATE PO Take 500 mg by mouth.  . sodium chloride (OCEAN) 0.65 % SOLN nasal spray Place 2 sprays into both nostrils daily as needed for congestion.  [name]  Turmeric (QC TUMERIC COMPLEX PO) Take by mouth.  . Vitamin D, Ergocalciferol, (DRISDOL) 1.25 MG (50000 UT) CAPS capsule Take 50,000 Units by mouth every 7 (seven) days.     Allergies:   Amoxicillin   Social History   Socioeconomic History  . Marital status: Single    Spouse name: Not on file  . Number of children: Not on file  . Years of education: Not on file  . Highest education level: Not on file  Occupational History  . Not on file  Tobacco Use  . Smoking status: Never Smoker  . Smokeless tobacco: Never Used  Substance and Sexual Activity  . Alcohol use: Never  . Drug use: Never  . Sexual activity: Not on file  Other Topics Concern  . Not on file  Social History Narrative   Used to live in [place] Determinants of  Health   Financial Resource Strain:   . Difficulty of Paying Living Expenses:   Food Insecurity:   . Worried About Charity fundraiser in the Last Year:   . Arboriculturist in the Last Year:   Transportation Needs:   . Film/video editor (Medical):   [name] Lack of Transportation (Non-Medical):   Physical Activity:   . Days of Exercise per Week:   . Minutes of Exercise per Session:   Stress:   . Feeling of Stress :   Social Connections:   . Frequency of Communication with Friends and Family:   . Frequency of Social Gatherings with Friends and Family:   . Attends Religious Services:   . Active Member of Clubs or Organizations:   . Attends Archivist Meetings:   [name] Marital Status:      Family History: The patient's family history includes Diabetes in her mother; Heart disease in her father.  ROS:   Please see the history of present illness.     All other systems reviewed and are negative.  EKGs/Labs/Other Studies Reviewed:    The following studies were reviewed today: TTE 2019-07-08 1. Left ventricular ejection fraction, by visual estimation, is 60 to 65%. The left ventricle has normal function. There is no left ventricular hypertrophy.  2. Left ventricular diastolic parameters are consistent with Grade I diastolic dysfunction (impaired relaxation).  3. Global right ventricle has normal systolic function.The right ventricular size is normal. No increase in right ventricular wall thickness.  4. Left atrial size was mildly dilated.  5. Mild to moderate mitral valve regurgitation.  6. Tricuspid valve regurgitation mild-moderate.  7. The pulmonic valve was normal in structure. Pulmonic valve regurgitation is not visualized.  8. Moderately elevated pulmonary artery systolic pressure.  9. The tricuspid regurgitant velocity is 3.28 m/s, and with an assumed right atrial pressure of 10 mmHg, the estimated right ventricular systolic pressure is moderately elevated at 53.0 mmHg.   EKG:  EKG is  ordered today.  The ekg ordered today demonstrates normal sinus rhythm, first-degree AV block, cannot rule out anterior infarct.   Recent Labs: 04/28/2019: BUN 26; Creatinine, Ser 1.67; Hemoglobin 12.2; Platelets 229.0; Potassium 3.9; Sodium 143; TSH 1.15 10/15/2019: ALT 10  Recent Lipid Panel    Component Value Date/Time   CHOL 83 10/15/2019 0913   TRIG 72.0 10/15/2019 0913   HDL 25.50 (L) 10/15/2019 0913   CHOLHDL 3 10/15/2019 0913   VLDL 14.4 10/15/2019 0913   LDLCALC 43 10/15/2019 0913    Physical Exam:    VS:  BP 138/62 (BP  Location: Left Arm, Patient Position: Sitting, Cuff Size: Normal)   Pulse 66   Ht 5\' 4"  (1.626 m)   Wt 189 lb 2 oz (85.8 kg)   SpO2 98%   BMI 32.46 kg/m     Wt Readings from Last 3 Encounters:  10/24/19 189 lb 2 oz (85.8 kg)  09/30/19 194 lb (88 kg)  06/24/19 191 lb (86.6 kg)     GEN:  Well nourished, well developed in no acute distress HEENT: Normal NECK: No JVD; No carotid bruits LYMPHATICS: No lymphadenopathy CARDIAC: RRR, no murmurs, rubs, gallops RESPIRATORY:  Clear to auscultation without rales, wheezing or rhonchi  ABDOMEN: Soft, non-tender, non-distended MUSCULOSKELETAL: Trace edema; No deformity  SKIN: Warm and dry NEUROLOGIC:  Alert and oriented x 3 PSYCHIATRIC:  Normal affect   ASSESSMENT:    1. Coronary artery disease involving native heart with angina pectoris, unspecified vessel or lesion type ([place])   2. Hypertension, unspecified type   3. Mitral valve insufficiency, unspecified etiology   4. OSA (obstructive sleep apnea)    PLAN:    1. History of CAD status post PCI.  Continue aspirin 81 mg, Lipitor 40 mg. 2. 3 of hypertension, blood pressure well controlled continue current BP meds.. 3. Repeat echocardiogram in about 1 to 2 years from last echocardiogram (after November 2021). 4. Patient still feels tired and short of breath with walking.  She states her current CPAP machine is old and cumbersome.  Will  refer patient to see pulmonary medicine regarding new fit test/new machine if possible.  Patient encouraged to use CPAP as prescribed.  If after a couple of months, symptoms still persist with CPAP compliance, we will consider a stress test.  Follow-up in 6 months    Medication Adjustments/Labs and Tests Ordered: Current medicines are reviewed at length with the patient today.  Concerns regarding medicines are outlined above.  Orders Placed This Encounter  Procedures  . Ambulatory referral to Pulmonology  . EKG 12-Lead   No orders of the defined types were placed in this encounter.   Patient Instructions  Medication Instructions:  Your physician recommends that you continue on your current medications as directed. Please refer to the Current Medication list given to you today.  *If you need a refill on your cardiac medications before your next appointment, please call your pharmacy*   Lab Work: None If you have labs (blood work) drawn today and your tests are completely normal, you will receive your results only by: [name] MyChart Message (if you have MyChart) OR . A paper copy in the mail If you have any lab test that is abnormal or we need to change your treatment, we will call you to review the results.   Testing/Procedures: None   Follow-Up: You have been referred to: Pulmonology. They will call you directly to schedule this appointment.  At [facility], you and your health needs are our priority.  As part of our continuing mission to provide you with exceptional heart care, we have created designated Provider Care Teams.  These Care Teams include your primary Cardiologist (physician) and Advanced Practice Providers (APPs -  Physician Assistants and Nurse Practitioners) who all work together to provide you with the care you need, when you need it.  We recommend signing up for the patient portal called "MyChart".  Sign up information is provided on this After Visit Summary.   MyChart is used to connect with patients for Virtual Visits (Telemedicine).  Patients are able to view lab/test  results, encounter notes, upcoming appointments, etc.  Non-urgent messages can be sent to your provider as well.   To learn more about what you can do with MyChart, go to [email or web address].    Your next appointment:   6 month(s)  The format for your next appointment:   In Person  Provider:   Dr Garen Lah      Signed, Kate Sable, MD  10/24/2019 2:44 PM    [place]

## 2019-10-24 NOTE — Telephone Encounter (Signed)
*  STAT* If patient is at the pharmacy, call can be transferred to refill team.   1. Which medications need to be refilled? (please list name of each medication and dose if known) atorvastatin 40 mg  2. Which pharmacy/location (including street and city if local pharmacy) is medication to be sent to? [address]  3. Do they need a 30 day or 90 day supply? [facility]

## 2019-10-24 NOTE — Telephone Encounter (Signed)
Requested Prescriptions   Signed Prescriptions Disp Refills   atorvastatin (LIPITOR) 40 MG tablet 90 tablet 0    Sig: Take 1 tablet (40 mg total) by mouth daily.    Authorizing Provider: Kate Sable    Ordering User: Raelene Bott, Shanetra Blumenstock L

## 2019-10-24 NOTE — Patient Instructions (Addendum)
Medication Instructions:  Your physician recommends that you continue on your current medications as directed. Please refer to the Current Medication list given to you today.  *If you need a refill on your cardiac medications before your next appointment, please call your pharmacy*   Lab Work: None If you have labs (blood work) drawn today and your tests are completely normal, you will receive your results only by: [name] MyChart Message (if you have MyChart) OR . A paper copy in the mail If you have any lab test that is abnormal or we need to change your treatment, we will call you to review the results.   Testing/Procedures: None   Follow-Up: You have been referred to: Pulmonology. They will call you directly to schedule this appointment.  At [facility], you and your health needs are our priority.  As part of our continuing mission to provide you with exceptional heart care, we have created designated Provider Care Teams.  These Care Teams include your primary Cardiologist (physician) and Advanced Practice Providers (APPs -  Physician Assistants and Nurse Practitioners) who all work together to provide you with the care you need, when you need it.  We recommend signing up for the patient portal called "MyChart".  Sign up information is provided on this After Visit Summary.  MyChart is used to connect with patients for Virtual Visits (Telemedicine).  Patients are able to view lab/test results, encounter notes, upcoming appointments, etc.  Non-urgent messages can be sent to your provider as well.   To learn more about what you can do with MyChart, go to [email or web address].    Your next appointment:   6 month(s)  The format for your next appointment:   In Person  Provider:   Dr Garen Lah

## 2019-10-29 ENCOUNTER — Other Ambulatory Visit (INDEPENDENT_AMBULATORY_CARE_PROVIDER_SITE_OTHER): Payer: Medicare Other

## 2019-10-29 ENCOUNTER — Other Ambulatory Visit: Payer: Self-pay

## 2019-10-29 DIAGNOSIS — N3000 Acute cystitis without hematuria: Secondary | ICD-10-CM

## 2019-10-30 LAB — URINE CULTURE
MICRO NUMBER:: 10261787
SPECIMEN QUALITY:: ADEQUATE

## 2019-11-03 ENCOUNTER — Telehealth: Payer: Self-pay | Admitting: Internal Medicine

## 2019-11-03 NOTE — Telephone Encounter (Signed)
Please advise 

## 2019-11-03 NOTE — Telephone Encounter (Signed)
Patient is requesting a referral for MRI for lower back and neck. Dr. Aundra Dubin and patient discussed this at last appointment.

## 2019-11-05 ENCOUNTER — Other Ambulatory Visit: Payer: Self-pay | Admitting: Internal Medicine

## 2019-11-05 DIAGNOSIS — I10 Essential (primary) hypertension: Secondary | ICD-10-CM

## 2019-11-05 MED ORDER — LABETALOL HCL 300 MG PO TABS: 300.0000 mg | ORAL_TABLET | Freq: Two times a day (BID) | ORAL | 3 refills | Status: DC

## 2019-11-05 MED ORDER — BLOOD GLUCOSE TEST VI STRP: ORAL_STRIP | 5 refills | Status: DC

## 2019-11-05 NOTE — Telephone Encounter (Signed)
Pt called in and said [facility] sent over a request for labetalol and one touch ultra blue test strips. She currently has a week left and she needs the refill to be called in.

## 2019-11-05 NOTE — Telephone Encounter (Signed)
Pended for your approval or denial.  ° °

## 2019-11-10 NOTE — Addendum Note (Signed)
Addended by: Orland Mustard on: 11/10/2019 11:22 AM   Modules accepted: Orders

## 2019-11-10 NOTE — Telephone Encounter (Signed)
We have to do Xray of neck 1st  MRI ordered  Same location awaiting approval inform pt when approved   Thanks [place]

## 2019-11-19 ENCOUNTER — Other Ambulatory Visit: Payer: Self-pay

## 2019-11-19 ENCOUNTER — Institutional Professional Consult (permissible substitution): Payer: Medicare Other | Admitting: Pulmonary Disease

## 2019-11-19 ENCOUNTER — Ambulatory Visit
Admission: RE | Admit: 2019-11-19 | Discharge: 2019-11-19 | Disposition: A | Discharge status: Home / Self Care | Payer: Medicare Other | Source: Ambulatory Visit | Attending: Internal Medicine | Admitting: Internal Medicine

## 2019-11-19 DIAGNOSIS — M545 Low back pain: Secondary | ICD-10-CM | POA: Diagnosis not present

## 2019-11-19 DIAGNOSIS — M5416 Radiculopathy, lumbar region: Secondary | ICD-10-CM | POA: Diagnosis not present

## 2019-11-19 IMAGING — MR MR LUMBAR SPINE W/O CM
4 of 5 series · 33 of 48 positions shown · non-contrast
Comparison: None.

CLINICAL DATA: Low back pain with left leg radiculopathy

EXAM:
MRI LUMBAR SPINE WITHOUT CONTRAST
TECHNIQUE: Multiplanar, multisequence MR imaging of the lumbar spine was
performed. No intravenous contrast was administered.

[Series 5: T2 · sagittal · 4.0mm · 0.81mm/px · 8 of 17 slices shown (1 of 2)]
[im 1/17]
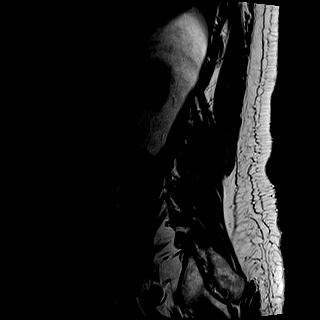
[im 3/17]
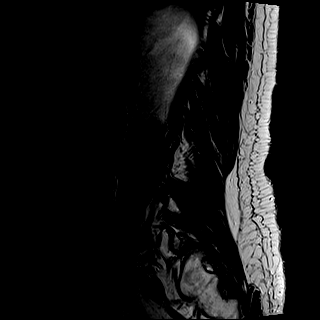
[im 5/17]
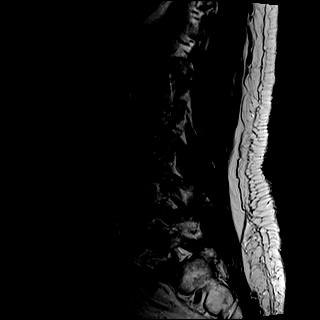
[im 7/17]
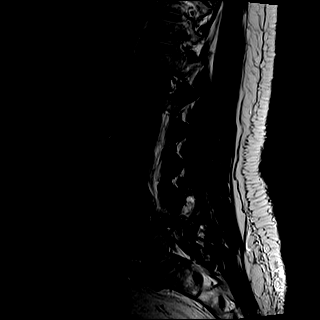
[im 10/17]
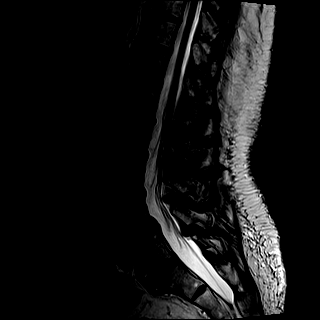
[im 12/17]
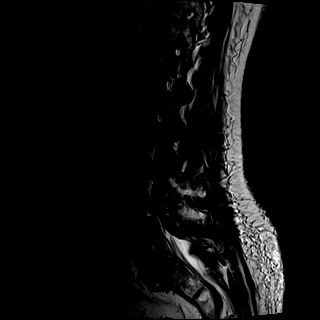
[im 14/17]
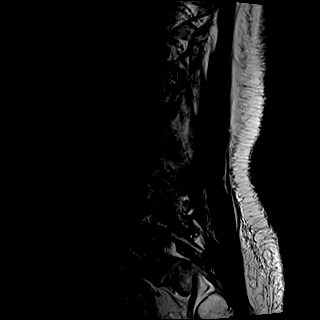
[im 17/17]
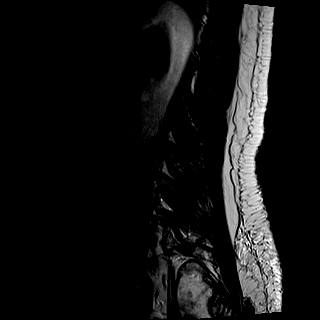

[Series 6: T1 · sagittal · 4.0mm · 0.81mm/px · 7 of 17 slices shown (1 of 2)]
[im 1/17]
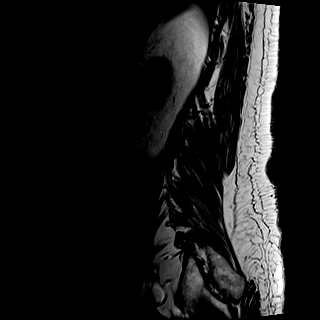
[im 3/17]
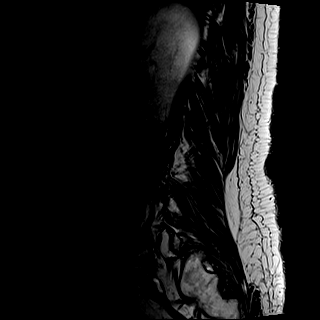
[im 6/17]
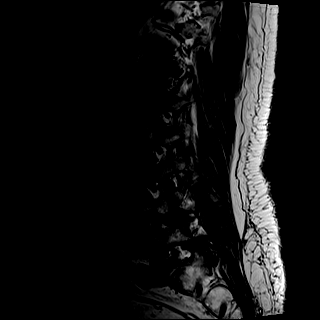
[im 9/17]
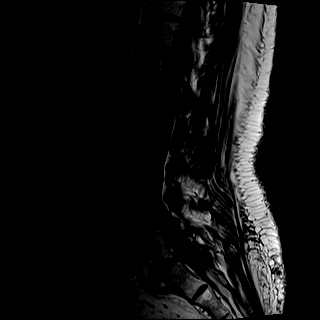
[im 11/17]
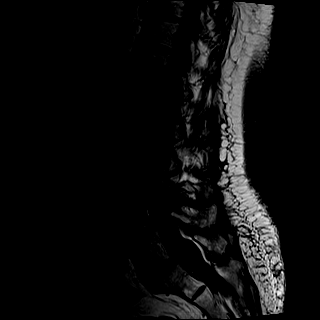
[im 14/17]
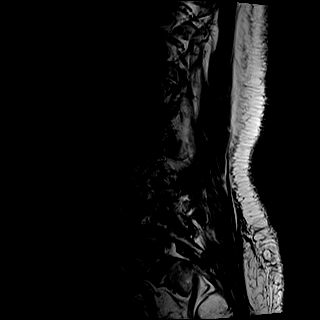
[im 17/17]
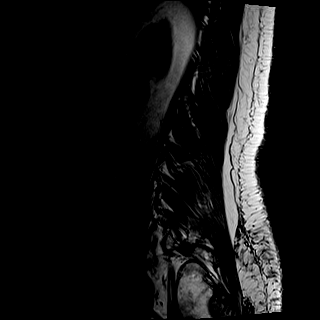

[Series 8: T2 · axial · 4.0mm · 0.78mm/px · z∈[-88,+115]mm · 9 of 31 slices shown (2 of 2)]
[im 1/31]
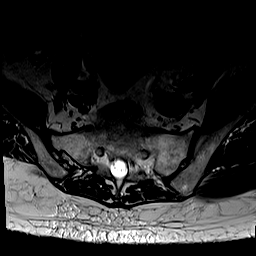
[im 6/31]
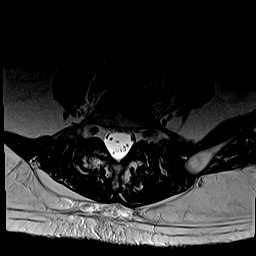
[im 11/31]
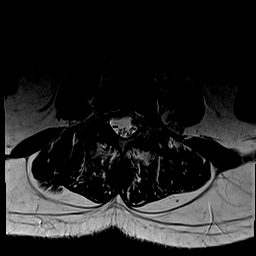
[im 13/31]
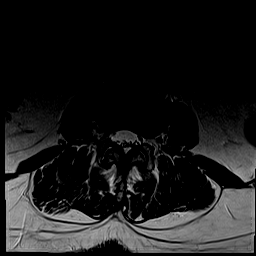
[im 16/31]
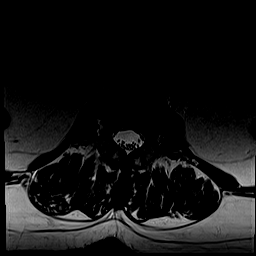
[im 18/31]
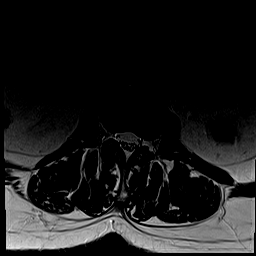
[im 21/31]
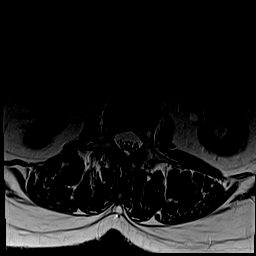
[im 26/31]
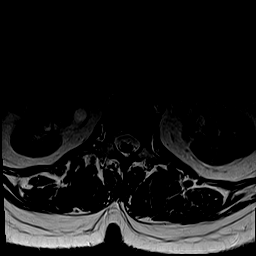
[im 31/31]
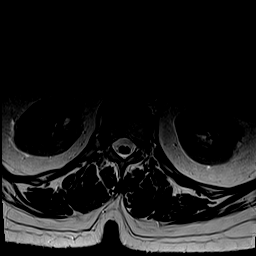

[Series 9: T1 · axial · 4.0mm · 0.39mm/px · z∈[-88,+115]mm · 9 of 31 slices shown (2 of 2)]
[im 1/31]
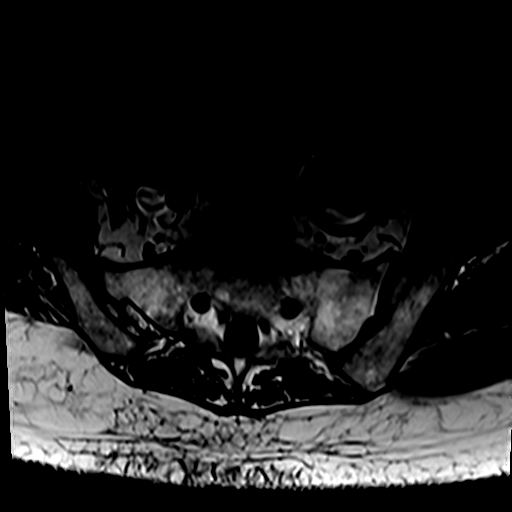
[im 6/31]
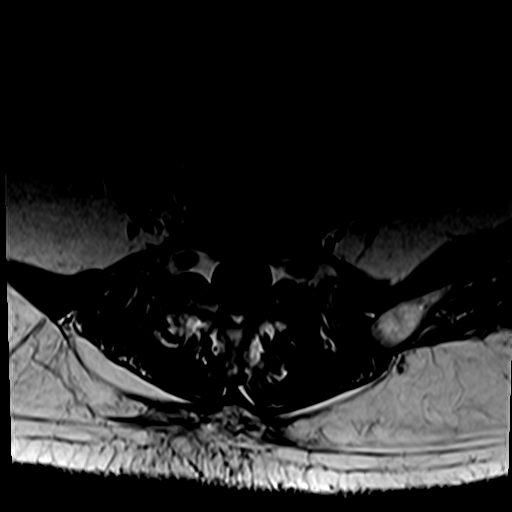
[im 11/31]
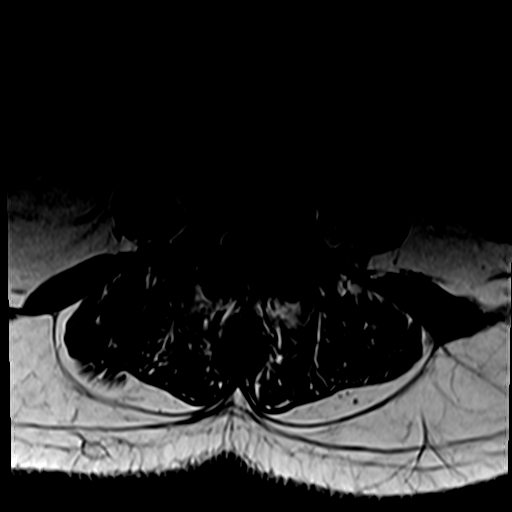
[im 13/31]
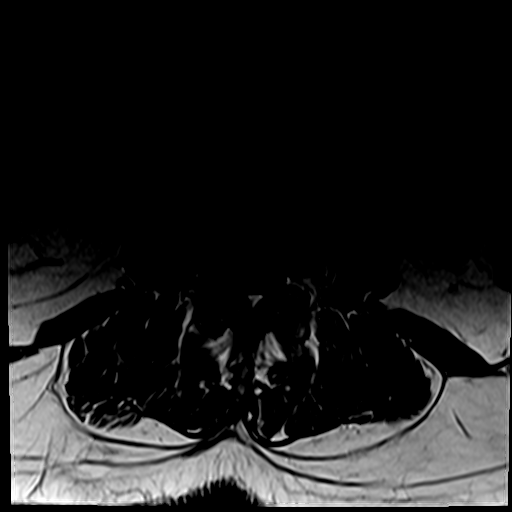
[im 16/31]
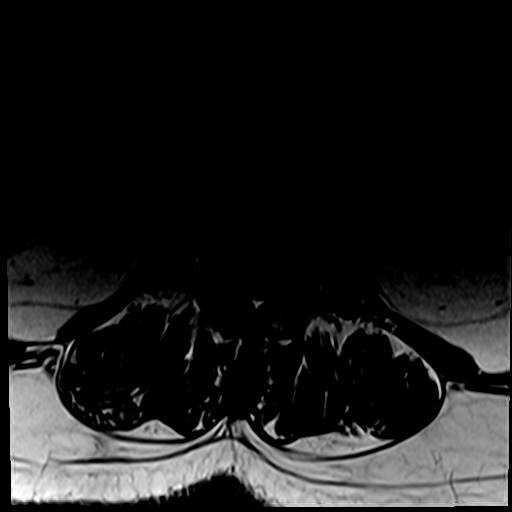
[im 18/31]
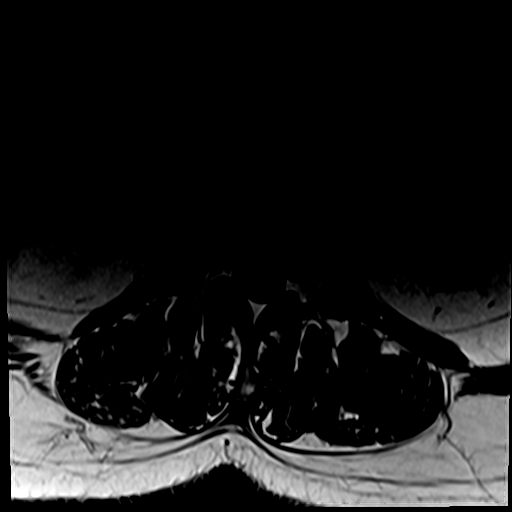
[im 21/31]
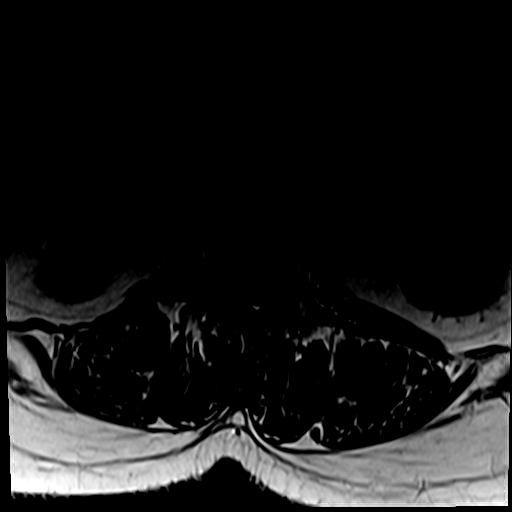
[im 26/31]
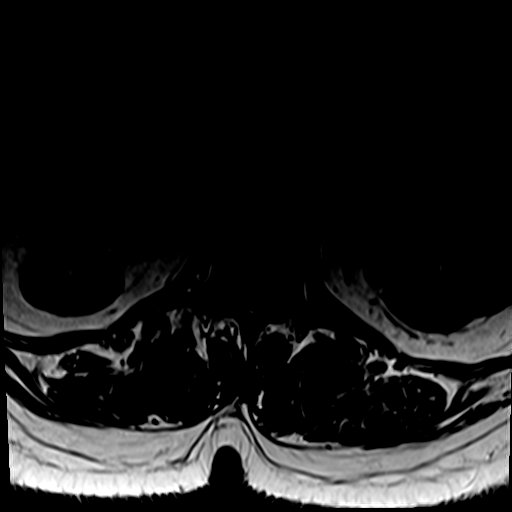
[im 31/31]
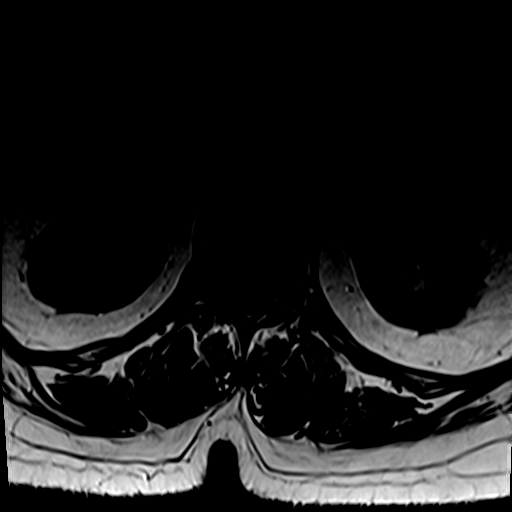

[33 of 48 positions shown; findings below may reference images not displayed]

FINDINGS: Segmentation: For the purposes of this dictation, the most caudal
lumbar type vertebral body is designated L5.

Alignment: Grade 1 anterolisthesis at L4-L5. Lordosis is preserved.

Vertebrae: Chronic loss of height at the superior endplate of L2. No
marrow edema or suspicious osseous lesion.

Conus medullaris and cauda equina: Conus extends to the L2-L3 level,
lower limit of normal. Conus and cauda equina appear normal.

Paraspinal and other soft tissues: Unremarkable.

Disc levels:

L1-L2: Minimal disc bulge. Minor facet arthropathy. No significant
canal or foraminal stenosis.

L2-L3: Minimal disc bulge with superimposed small left foraminal
protrusion. Minor facet arthropathy. No significant canal or
foraminal stenosis.

L3-L4: Mild disc bulge. Mild facet arthropathy. No significant canal
or foraminal stenosis.

L4-L5: Anterolisthesis with uncovering of disc bulge and
superimposed left subarticular protrusion, small endplate
osteophytes, and marked facet arthropathy with ligamentum flavum
infolding. No significant canal stenosis. Narrowing of the left
lateral recess with mass effect on the traversing left L5 nerve
root. Mild to moderate foraminal stenosis, left greater than right.

L5-S1: Minimal disc bulge eccentric to the left. Marked facet
arthropathy. No significant canal or foraminal stenosis.
IMPRESSION: Multilevel degenerative changes as detailed above, greatest at
L4-L5. There may be left L5 nerve root compression at this level.

## 2019-11-21 ENCOUNTER — Encounter: Payer: Self-pay | Admitting: Pulmonary Disease

## 2019-11-21 ENCOUNTER — Ambulatory Visit (INDEPENDENT_AMBULATORY_CARE_PROVIDER_SITE_OTHER): Payer: Medicare Other | Admitting: Pulmonary Disease

## 2019-11-21 ENCOUNTER — Other Ambulatory Visit: Payer: Self-pay

## 2019-11-21 VITALS — BP 132/64 | HR 72 | Temp 98.2°F | Ht 65.0 in | Wt 184.2 lb

## 2019-11-21 DIAGNOSIS — Z9189 Other specified personal risk factors, not elsewhere classified: Secondary | ICD-10-CM | POA: Diagnosis not present

## 2019-11-21 DIAGNOSIS — J309 Allergic rhinitis, unspecified: Secondary | ICD-10-CM

## 2019-11-21 NOTE — Assessment & Plan Note (Addendum)
History of obstructive sleep apnea We do not have these records Epworth score today 8 Stop bang score today 5  Plan: Home sleep study ordered Follow-up after completing  sleep study

## 2019-11-21 NOTE — Patient Instructions (Addendum)
You were seen today by Lauraine Rinne, NP  for:   1. At risk for obstructive sleep apnea  - Home sleep test; Future  2. Allergic rhinitis, unspecified seasonality, unspecified trigger  Please start taking a daily antihistamine:  >>>choose one of: zyrtec, claritin, allegra, or xyzal  >>>these are over the counter medications  >>>can choose generic option  >>>take daily  >>>this medication helps with allergies, post nasal drip, and cough   Continue nasal saline rinses twice daily   We recommend today:  Orders Placed This Encounter  Procedures  . Home sleep test    Standing Status:   Future    Standing Expiration Date:   11/20/2020    Order Specific Question:   Where should this test be performed:    Answer:   [facility] - Pulmonary   Orders Placed This Encounter  Procedures  . Home sleep test   No orders of the defined types were placed in this encounter.   Follow Up:    Return in about 3 months (around 02/20/2020), or if symptoms worsen or fail to improve, for Follow up with Dr. Halford Chessman, Follow up with Wyn Quaker FNP-C.   Please do your part to reduce the spread of COVID-19:      Reduce your risk of any infection  and COVID19 by using the similar precautions used for avoiding the common cold or flu:  [name] Wash your hands often with soap and warm water for at least 20 seconds.  If soap and water are not readily available, use an alcohol-based hand sanitizer with at least 60% alcohol.  . If coughing or sneezing, cover your mouth and nose by coughing or sneezing into the elbow areas of your shirt or coat, into a tissue or into your sleeve (not your hands). Langley Gauss A MASK when in public  . Avoid shaking hands with others and consider head nods or verbal greetings only. . Avoid touching your eyes, nose, or mouth with unwashed hands.  . Avoid close contact with people who are sick. . Avoid places or events with large numbers of people in one location, like concerts or sporting events. . If  you have some symptoms but not all symptoms, continue to monitor at home and seek medical attention if your symptoms worsen. . If you are having a medical emergency, call 911.   [place] / e-Visit: [email or web address]         [facility]: [facility]: [phone]                   [facility]: [phone]     It is flu season:   >>> Best ways to protect herself from the flu: Receive the yearly flu vaccine, practice good hand hygiene washing with soap and also using hand sanitizer when available, eat a nutritious meals, get adequate rest, hydrate appropriately   Please contact the office if your symptoms worsen or you have concerns that you are not improving.   Thank you for choosing [facility] for your healthcare, and for allowing [place] to partner with you on your healthcare journey. I am thankful to be able to provide care to you today.   Wyn Quaker FNP-C

## 2019-11-21 NOTE — Progress Notes (Signed)
@Patient  ID: Vickie Kemp, female    DOB: 1940/10/15, 79 y.o.   MRN: [id number]  Chief Complaint  Patient presents with  . sleep consult    Referring provider: McLean-Scocuzza, Olivia Mackie *  HPI:  79 year old female never smoker referred to our office for obstructive sleep apnea management on 11/21/2019  PMH: Allergic rhinitis Smoker/ Smoking History: Never Smoker  Maintenance: None    Pt of: Dr. Halford Chessman   11/21/2019  - Visit   79 year old female presenting to our office today for history of obstructive sleep apnea.  Patient reports that she was diagnosed with obstructive sleep apnea in [place] over 10 years ago.  She was started on CPAP therapy.  She did struggle with some CPAP use due to mask fit.  She reports that she stopped using CPAP 7 years ago.  She is interested in resuming care in our office.  She would like to discuss this today.  Patient also having some nasal congestion and some dizziness over the past few weeks.  She is not on any sort of allergy medications.  She reports she did have some nasal drainage today.  We will evaluate this today.   SLEEP ROS   Epworth score today:8  Do you feel you have non-refreshing sleep?: Yes Daytime sleepiness?: Yes Witnessed apneas?: No Loud snoring?: Yes Choking or gasping episodes that wake pt up from sleep?: No  Does patient experienced sleepiness passenger in a car, lying down to rest in the afternoons, sitting and reading, watching TV or other social situations?: No  Bedtime is typically around: 1-2am Sleep latency?: 16min  Which position does the patient sleep in: belly  Nocturnal awakenings?: Yes, every 3 hours  Nightmares?:  Yes Sleep talking?: yes Restless Legs?: no When this patient out of bed in the morning?: 830-9pm When wake up to you feel tired, treated any dryness in the mouth, morning headaches?:  Yes morning headaches   Questionaires / Pulmonary Flowsheets:   Epworth:  Results of the Epworth flowsheet  11/21/2019  Sitting and reading 2  Watching TV 3  Sitting, inactive in a public place (e.g. a theatre or a meeting) 0  As a passenger in a car for an hour without a break 0  Lying down to rest in the afternoon when circumstances permit 3  Sitting and talking to someone 0  Sitting quietly after a lunch without alcohol 0  In a car, while stopped for a few minutes in traffic 0  Total score 8    STOP BANG questionnaire  Snoring? yes Tiredness? yes Observed apneas?yes Elevated blood pressure?yes BMI greater than 35?no  Age greater than 50? yes Neck circumference greater than 40 cm? No  Female gender? No   Scoring:  One-point is signed for each.   0-2 equals low risk, 3-4 equals intermediate risk, those with 5 or greater high risk for having obstructive sleep apnea  Score: 5   Tests:   FENO:  No results found for: NITRICOXIDE  PFT: No flowsheet data found.  WALK:  No flowsheet data found.  Imaging: MR Lumbar Spine Wo Contrast  Result Date: 11/20/2019 CLINICAL DATA:  Low back pain with left leg radiculopathy EXAM: MRI LUMBAR SPINE WITHOUT CONTRAST TECHNIQUE: Multiplanar, multisequence MR imaging of the lumbar spine was performed. No intravenous contrast was administered. COMPARISON:  None. FINDINGS: Segmentation: For the purposes of this dictation, the most caudal lumbar type vertebral body is designated L5. Alignment: Grade 1 anterolisthesis at L4-L5. Lordosis is preserved. Vertebrae: Chronic loss  of height at the superior endplate of L2. No marrow edema or suspicious osseous lesion. Conus medullaris and cauda equina: Conus extends to the L2-L3 level, lower limit of normal. Conus and cauda equina appear normal. Paraspinal and other soft tissues: Unremarkable. Disc levels: L1-L2: Minimal disc bulge. Minor facet arthropathy. No significant canal or foraminal stenosis. L2-L3: Minimal disc bulge with superimposed small left foraminal protrusion. Minor facet arthropathy. No significant  canal or foraminal stenosis. L3-L4: Mild disc bulge. Mild facet arthropathy. No significant canal or foraminal stenosis. L4-L5: Anterolisthesis with uncovering of disc bulge and superimposed left subarticular protrusion, small endplate osteophytes, and marked facet arthropathy with ligamentum flavum infolding. No significant canal stenosis. Narrowing of the left lateral recess with mass effect on the traversing left L5 nerve root. Mild to moderate foraminal stenosis, left greater than right. L5-S1: Minimal disc bulge eccentric to the left. Marked facet arthropathy. No significant canal or foraminal stenosis. IMPRESSION: Multilevel degenerative changes as detailed above, greatest at L4-L5. There may be left L5 nerve root compression at this level. Electronically Signed   By: Macy Mis M.D.   On: 11/20/2019 08:17    Lab Results:  CBC    Component Value Date/Time   WBC 9.2 04/28/2019 1408   RBC 4.27 04/28/2019 1408   HGB 12.2 04/28/2019 1408   HCT 38.1 04/28/2019 1408   PLT 229.0 04/28/2019 1408   MCV 89.1 04/28/2019 1408   MCHC 32.1 04/28/2019 1408   RDW 14.1 04/28/2019 1408   LYMPHSABS 2.2 04/28/2019 1408   MONOABS 0.7 04/28/2019 1408   EOSABS 0.1 04/28/2019 1408   BASOSABS 0.0 04/28/2019 1408    BMET    Component Value Date/Time   NA 143 04/28/2019 1408   K 3.9 04/28/2019 1408   CL 106 04/28/2019 1408   CO2 28 04/28/2019 1408   GLUCOSE 125 (H) 04/28/2019 1408   BUN 26 (H) 04/28/2019 1408   CREATININE 1.67 (H) 04/28/2019 1408   CALCIUM 10.0 04/28/2019 1408    BNP No results found for: BNP  ProBNP No results found for: PROBNP  Specialty Problems      Pulmonary Problems   Chronic maxillary sinusitis   Allergic rhinitis      Allergies  Allergen Reactions  . Amoxicillin Itching    Immunization History  Administered Date(s) Administered  . Fluad Quad(high Dose 65+) 04/28/2019  . Influenza, High Dose Seasonal PF 06/04/2017, 06/07/2018  . PFIZER SARS-COV-2  Vaccination 10/08/2019, 10/29/2019  . Pneumococcal Polysaccharide-23 05/05/2013    Past Medical History:  Diagnosis Date  . Chronic kidney disease   . Coronary artery disease    MI, pci to mid LAD 2014. LHC 80%OM, 60%RCA, 90% LAD  . Diabetes mellitus without complication ([place])   . Hypertension   . Sleep apnea     Tobacco History: Social History   Tobacco Use  Smoking Status Never Smoker  Smokeless Tobacco Never Used   Counseling given: Yes   Continue to not smoke  Outpatient Encounter Medications as of 11/21/2019  Medication Sig  . acetaminophen (TYLENOL) 650 MG CR tablet Take 650 mg by mouth 3 (three) times daily as needed.  [name] amLODipine (NORVASC) 10 MG tablet Take 10 mg by mouth daily.  [name] aspirin EC 81 MG tablet Take 81 mg by mouth daily.  [name] atorvastatin (LIPITOR) 40 MG tablet Take 1 tablet (40 mg total) by mouth daily.  . furosemide (LASIX) 40 MG tablet Take 40 mg by mouth.  . Glucose Blood (BLOOD GLUCOSE TEST STRIPS)  STRP E11.9 check sugar tid for designated machine  . insulin detemir (LEVEMIR FLEXTOUCH) 100 UNIT/ML FlexPen 15  units in am and 30 units qhs with food  . insulin lispro (HUMALOG KWIKPEN) 100 UNIT/ML KwikPen Prn based on sugar  If sugar 70-130 0 units  If sugar 131-180 4 units  If sugar 181-240 8 units  If sugar 241-300 10 units  If sugar 301-350 12 units  If sugar 351-400 16 units  If >400 20 units and call doctor  . labetalol (NORMODYNE) 300 MG tablet Take 1 tablet (300 mg total) by mouth 2 (two) times daily.  [name] latanoprost (XALATAN) 0.005 % ophthalmic solution Place 1 drop into the left eye at bedtime.  [name] MAGNESIUM SULFATE PO Take 500 mg by mouth.  . sodium chloride (OCEAN) 0.65 % SOLN nasal spray Place 2 sprays into Kemp nostrils daily as needed for congestion.  . Turmeric (QC TUMERIC COMPLEX PO) Take by mouth.  . Vitamin D, Ergocalciferol, (DRISDOL) 1.25 MG (50000 UT) CAPS capsule Take 50,000 Units by mouth every 7 (seven) days.  . [DISCONTINUED]  ciprofloxacin (CIPRO) 250 MG tablet Take 1 tablet (250 mg total) by mouth 2 (two) times daily.  . [DISCONTINUED] loratadine (CLARITIN) 10 MG tablet Take 0.5 tablets (5 mg total) by mouth daily as needed for allergies. (Patient not taking: Reported on 11/21/2019)   No facility-administered encounter medications on file as of 11/21/2019.     Review of Systems  Review of Systems  Constitutional: Negative for activity change, fatigue and fever.  HENT: Positive for congestion, postnasal drip and rhinorrhea. Negative for sinus pressure, sinus pain and sore throat.   Respiratory: Negative for cough, shortness of breath and wheezing.   Cardiovascular: Negative for chest pain and palpitations.  Gastrointestinal: Negative for diarrhea, nausea and vomiting.  Musculoskeletal: Negative for arthralgias.  Neurological: Negative for dizziness.  Psychiatric/Behavioral: Positive for sleep disturbance. The patient is not nervous/anxious.      Physical Exam  BP 132/64 (BP Location: Left Arm, Cuff Size: Normal)   Pulse 72   Temp 98.2 F (36.8 C) (Temporal)   Ht 5\' 5"  (1.651 m)   Wt 184 lb 3.2 oz (83.6 kg)   SpO2 97%   BMI 30.65 kg/m   Wt Readings from Last 5 Encounters:  11/21/19 184 lb 3.2 oz (83.6 kg)  10/24/19 189 lb 2 oz (85.8 kg)  09/30/19 194 lb (88 kg)  06/24/19 191 lb (86.6 kg)  05/09/19 189 lb (85.7 kg)    BMI Readings from Last 5 Encounters:  11/21/19 30.65 kg/m  10/24/19 32.46 kg/m  09/30/19 32.28 kg/m  06/24/19 31.78 kg/m  05/09/19 31.45 kg/m     Physical Exam Vitals and nursing note reviewed.  Constitutional:      General: She is not in acute distress.    Appearance: Normal appearance.  HENT:     Head: Normocephalic and atraumatic.     Right Ear: Tympanic membrane, ear canal and external ear normal. There is no impacted cerumen (Moderate eustachian tube dysfunction, TM with effusion without infection).     Left Ear: Tympanic membrane, ear canal and external ear  normal. There is no impacted cerumen.     Nose: Congestion and rhinorrhea present.     Mouth/Throat:     Mouth: Mucous membranes are moist.     Pharynx: Oropharynx is clear.     Comments: Mallampati 3 Eyes:     Pupils: Pupils are equal, round, and reactive to light.  Cardiovascular:  Rate and Rhythm: Normal rate and regular rhythm.     Pulses: Normal pulses.     Heart sounds: Normal heart sounds. No murmur.  Pulmonary:     Breath sounds: Normal breath sounds. No decreased air movement. No decreased breath sounds, wheezing or rales.  Musculoskeletal:     Cervical back: Normal range of motion.  Skin:    General: Skin is warm and dry.     Capillary Refill: Capillary refill takes less than 2 seconds.  Neurological:     General: No focal deficit present.     Mental Status: She is alert and oriented to person, place, and time. Mental status is at baseline.     Gait: Gait normal.  Psychiatric:        Mood and Affect: Mood normal.        Behavior: Behavior normal.        Thought Content: Thought content normal.        Judgment: Judgment normal.       Assessment & Plan:   Allergic rhinitis Likely AR flare today Primary care had already recommended for the patient use Claritin, she had not started it  Plan: Start Claritin as previously instructed Follow-up with primary care symptoms or not improving Start nasal saline rinses twice daily  At risk for obstructive sleep apnea History of obstructive sleep apnea We do not have these records Epworth score today 8 Stop bang score today 5  Plan: Home sleep study ordered Follow-up after completing  sleep study    Return in about 3 months (around 02/20/2020), or if symptoms worsen or fail to improve, for Follow up with Dr. Halford Chessman, Follow up with Wyn Quaker FNP-C.   Lauraine Rinne, NP 11/21/2019   This appointment required 32 minutes of patient care (this includes precharting, chart review, review of results, face-to-face care,  etc.).

## 2019-11-21 NOTE — Assessment & Plan Note (Signed)
Likely AR flare today Primary care had already recommended for the patient use Claritin, she had not started it  Plan: Start Claritin as previously instructed Follow-up with primary care symptoms or not improving Start nasal saline rinses twice daily

## 2019-11-21 NOTE — Progress Notes (Signed)
Reviewed and agree with assessment/plan.   Tamryn Popko, MD [facility] 08/09/2016, 12:24 PM Pager:  [phone]  

## 2019-11-25 ENCOUNTER — Ambulatory Visit (INDEPENDENT_AMBULATORY_CARE_PROVIDER_SITE_OTHER): Payer: Medicare Other

## 2019-11-25 VITALS — BP 132/64 | HR 72 | Ht 65.0 in | Wt 184.0 lb

## 2019-11-25 DIAGNOSIS — Z0000 Encounter for general adult medical examination without abnormal findings: Secondary | ICD-10-CM | POA: Diagnosis not present

## 2019-11-25 DIAGNOSIS — Z748 Other problems related to care provider dependency: Secondary | ICD-10-CM

## 2019-11-25 NOTE — Progress Notes (Addendum)
Subjective:   Vickie Kemp is a 79 y.o. female who presents for an Initial Medicare Annual Wellness Visit.  Review of Systems    No ROS.  Medicare Wellness Virtual Visit.  Visual/audio telehealth visit, UTA vital signs.   Vitals provided by patient. See social history for additional risk factors.  Cardiac Risk Factors include: advanced age (>66men, >43 women);diabetes mellitus;hypertension     Objective:    Today's Vitals   11/25/19 1402  BP: 132/64  Pulse: 72  Weight: 184 lb (83.5 kg)  Height: 5\' 5"  (1.651 m)   Body mass index is 30.62 kg/m.  Advanced Directives 11/25/2019  Does Patient Have a Medical Advance Directive? Yes  Type of Paramedic of [place];Living will;Out of facility DNR (pink MOST or yellow form)  Does patient want to make changes to medical advance directive? No - Patient declined  Copy of [place] in Chart? No - copy requested    Current Medications (verified) Outpatient Encounter Medications as of 11/25/2019  Medication Sig  . acetaminophen (TYLENOL) 650 MG CR tablet Take 650 mg by mouth 3 (three) times daily as needed.  [name] aspirin EC 81 MG tablet Take 81 mg by mouth daily.  [name] atorvastatin (LIPITOR) 40 MG tablet Take 1 tablet (40 mg total) by mouth daily.  . furosemide (LASIX) 40 MG tablet Take 40 mg by mouth.  . Glucose Blood (BLOOD GLUCOSE TEST STRIPS) STRP E11.9 check sugar tid for designated machine  . insulin detemir (LEVEMIR FLEXTOUCH) 100 UNIT/ML FlexPen 15  units in am and 30 units qhs with food  . labetalol (NORMODYNE) 300 MG tablet Take 1 tablet (300 mg total) by mouth 2 (two) times daily.  [name] latanoprost (XALATAN) 0.005 % ophthalmic solution Place 1 drop into the left eye at bedtime.  [name] MAGNESIUM SULFATE PO Take 500 mg by mouth.  . sodium chloride (OCEAN) 0.65 % SOLN nasal spray Place 2 sprays into both nostrils daily as needed for congestion.  . Turmeric (QC TUMERIC COMPLEX PO) Take by mouth.    . Vitamin D, Ergocalciferol, (DRISDOL) 1.25 MG (50000 UT) CAPS capsule Take 50,000 Units by mouth every 7 (seven) days.  [name] amLODipine (NORVASC) 10 MG tablet Take 10 mg by mouth daily.  . insulin lispro (HUMALOG KWIKPEN) 100 UNIT/ML KwikPen Prn based on sugar  If sugar 70-130 0 units  If sugar 131-180 4 units  If sugar 181-240 8 units  If sugar 241-300 10 units  If sugar 301-350 12 units  If sugar 351-400 16 units  If >400 20 units and call doctor (Patient not taking: Reported on 11/25/2019)   No facility-administered encounter medications on file as of 11/25/2019.    Allergies (verified) Amoxicillin   History: Past Medical History:  Diagnosis Date  . Chronic kidney disease   . Coronary artery disease    MI, pci to mid LAD 2014. LHC 80%OM, 60%RCA, 90% LAD  . Diabetes mellitus without complication ([place])   . Hypertension   . Sleep apnea    Past Surgical History:  Procedure Laterality Date  . ACNE CYST REMOVAL  2008   Groin area  . CARDIAC CATHETERIZATION    . CORONARY ANGIOPLASTY     Family History  Problem Relation Age of Onset  . Diabetes Mother   . Heart disease Father    Social History   Socioeconomic History  . Marital status: Single    Spouse name: Not on file  . Number of children: Not on  file  . Years of education: Not on file  . Highest education level: Not on file  Occupational History  . Not on file  Tobacco Use  . Smoking status: Never Smoker  . Smokeless tobacco: Never Used  Substance and Sexual Activity  . Alcohol use: Never  . Drug use: Never  . Sexual activity: Not on file  Other Topics Concern  . Not on file  Social History Narrative   Used to live in [place] Determinants of Health   Financial Resource Strain:   . Difficulty of Paying Living Expenses:   Food Insecurity:   . Worried About Charity fundraiser in the Last Year:   . Arboriculturist in the Last Year:   Transportation Needs:   . Film/video editor (Medical):   [name]  Lack of Transportation (Non-Medical):   Physical Activity:   . Days of Exercise per Week:   . Minutes of Exercise per Session:   Stress:   . Feeling of Stress :   Social Connections:   . Frequency of Communication with Friends and Family:   . Frequency of Social Gatherings with Friends and Family:   . Attends Religious Services:   . Active Member of Clubs or Organizations:   . Attends Archivist Meetings:   [name] Marital Status:     Tobacco Counseling Counseling given: Not Answered   Clinical Intake:  Pre-visit preparation completed: Yes        Diabetes: Yes(Followed by pcp)  How often do you need to have someone help you when you read instructions, pamphlets, or other written materials from your doctor or pharmacy?: 1 - Never  Interpreter Needed?: No      Activities of Daily Living In your present state of health, do you have any difficulty performing the following activities: 11/25/2019  Hearing? N  Vision? N  Difficulty concentrating or making decisions? N  Walking or climbing stairs? Y  Comment Pace self. SOBOE.  Dressing or bathing? N  Doing errands, shopping? Y  Comment She does not Physiological scientist and eating ? N  Using the Toilet? N  In the past six months, have you accidently leaked urine? Y  Comment Managed with pad. About 3x nightly. Recommended stop drinking fluids 2 hours before bedtime. Agrees to monitor and notify PMD as needed.  Do you have problems with loss of bowel control? N  Managing your Medications? N  Managing your Finances? N  Housekeeping or managing your Housekeeping? N     Immunizations and Health Maintenance Immunization History  Administered Date(s) Administered  . Fluad Quad(high Dose 65+) 04/28/2019  . Influenza, High Dose Seasonal PF 06/04/2017, 06/07/2018  . PFIZER SARS-COV-2 Vaccination 10/08/2019, 10/29/2019  . Pneumococcal Polysaccharide-23 05/05/2013   There are no preventive care reminders to display for  this patient.  Patient Care Team: McLean-Scocuzza, Nino Glow, MD as PCP - General (Internal Medicine) Kate Sable, MD as PCP - Cardiology (Cardiology)  Indicate any recent Medical Services you may have received from other than [facility] providers in the past year (date may be approximate).     Assessment:   This is a routine wellness examination for Denisse.  Nurse connected with patient 11/25/19 at  2:00 PM EDT by a telephone enabled telemedicine application and verified that I am speaking with the correct person using two identifiers. Patient stated full name and DOB. Patient gave permission to continue with virtual visit. Patient's location was at home  and Nurse's location was at [place] office.   Patient is alert and oriented x3. Patient denies difficulty focusing or concentrating. Patient likes to read scripture and completes crossword puzzles for brain health. Patient notes brain fog every once in awhile. Appointment offered with PMD, scheduled 12/26/19 per patient preference.   Health Maintenance Due: -Prevnar 13 and Tdap vaccine- discussed; to be completed with doctor in visit or local pharmacy a minimum of 4 weeks after covid vaccine completed. -Dexa Scan- deferred per patient preference. -Hgb A1c- 10/15/19 (9.1) See completed HM at the end of note.   Podiatry- every 6 months  Dental: Implants  Hearing: Demonstrates normal hearing during visit.  Safety:  Patient feels safe at home- yes Patient does have smoke detectors at home- yes Patient does wear sunscreen or protective clothing when in direct sunlight - yes Patient does wear seat belt when in a moving vehicle - yes Patient drives- no Adequate lighting in walkways free from debris- yes Grab bars and handrails used as appropriate- yes Ambulates with an assistive device- no; plans to get a walking cane.   Social: Alcohol intake - no     Smoking history- never   Smokers in home? none Illicit drug use?  none  Medication: Not taking amlodipine, declines refill. Notes amlodipine to be changed to losartan by Nephrology. Pill box in use -yes  Self managed - yes  Covid-19: Precautions and sickness symptoms discussed. Wears mask, social distancing, hand hygiene as appropriate.   Activities of Daily Living Patient denies needing assistance with: household chores, feeding themselves, getting from bed to chair, getting to the toilet, bathing/showering, dressing, managing money, or preparing meals.  Shower chair in use.   Discussed the importance of a healthy diet, water intake and the benefits of aerobic exercise.   Physical activity- walking in place while sitting, standing leg lifts, arm strengthening with 3lb weights, ankle flexes.   Diet:  Regular; encouraged diabetic/low carb diet Water: good intake Caffeine: a coffee every once in awhile   Other Providers Patient Care Team: McLean-Scocuzza, Nino Glow, MD as PCP - General (Internal Medicine) Kate Sable, MD as PCP - Cardiology (Cardiology)  Hearing/Vision screen  Hearing Screening   125Hz  250Hz  500Hz  1000Hz  2000Hz  3000Hz  4000Hz  6000Hz  8000Hz   Right ear:           Left ear:           Comments: Patient is able to hear conversational tones without difficulty.  No issues reported.   Vision Screening Comments: Followed by Dr. Glennon Mac ([place]) Wears corrective lenses Cataract extraction, bilateral Retina repair completed; drops in use.  Visual acuity not assessed, virtual visit.  They have seen their ophthalmologist in the last 12 months.     Dietary issues and exercise activities discussed: Current Exercise Habits: Home exercise routine, Type of exercise: stretching(Standing/sitting exercises.), Frequency (Times/Week): 7, Intensity: Mild  Goals      Patient Stated   . I would like to take online classes (pt-stated)     Brain enhancement activity      Depression Screen PHQ 2/9 Scores 11/25/2019 09/30/2019  04/28/2019  PHQ - 2 Score 0 0 0  PHQ- 9 Score - - 0    Fall Risk Fall Risk  11/25/2019 09/30/2019 04/28/2019  Falls in the past year? 1 0 0  Number falls in past yr: (No Data) 0 0  Comment Notes fell lastnight while sitting on the egde of the bed putting on socks. Denies injury. Cannot recall slipping/falling off  the bed, just getting back up. Declines follow up at this time. - -  Injury with Fall? 0 0 0  Risk for fall due to : - Impaired balance/gait -  Follow up Falls evaluation completed;Falls prevention discussed Falls evaluation completed Falls evaluation completed    Timed Get Up and Go Performed no, virtual visit  Cognitive Function:     6CIT Screen 11/25/2019  What Year? 0 points  What month? 0 points  What time? 0 points  Count back from 20 0 points  Months in reverse 0 points  Repeat phrase 0 points  Total Score 0    Screening Tests Health Maintenance  Topic Date Due  . DEXA SCAN  11/24/2020 (Originally 11/30/2006)  . TETANUS/TDAP  11/24/2020 (Originally 11/29/1959)  . PNA vac Low Risk Adult (2 of 2 - PCV13) 11/24/2020 (Originally 05/05/2014)  . INFLUENZA VACCINE  03/14/2020  . HEMOGLOBIN A1C  04/16/2020  . FOOT EXAM  08/27/2020  . OPHTHALMOLOGY EXAM  08/28/2020  . URINE MICROALBUMIN  10/14/2020     Plan:   Keep all routine maintenance appointments.   Patient notes brain fog every once in awhile. Appointment offered with PMD in April, scheduled 12/26/19 per patient preference.   Patient notes family brings her to medical appointment. Difficulty scheduling with others at times. Transportation referral placed for assistance to medical appointments.   Diet- encouraged low carb/diabetic diet. Patient notes not making good choices with diet. Agrees to receive Ensure Max protein coupons, dial a dietician complimentary conversation pamphlet and managing blood sugar diet suggestions. Mailed to patient.   Medicare Attestation I have personally reviewed: The patient's  medical and social history Their use of alcohol, tobacco or illicit drugs Their current medications and supplements The patient's functional ability including ADLs,fall risks, home safety risks, cognitive, and hearing and visual impairment Diet and physical activities Evidence for depression   I have reviewed and discussed with patient certain preventive protocols, quality metrics, and best practice recommendations.      Varney Biles, LPN   [id number]/1191   Agree  TMS

## 2019-11-25 NOTE — Patient Instructions (Addendum)
  Vickie Kemp , Thank you for taking time to come for your Medicare Wellness Visit. I appreciate your ongoing commitment to your health goals. Please review the following plan we discussed and let me know if I can assist you in the future.   These are the goals we discussed: Goals      Patient Stated   . I would like to take online classes (pt-stated)     Brain enhancement activity       This is a list of the screening recommended for you and due dates:  Health Maintenance  Topic Date Due  . DEXA scan (bone density measurement)  11/24/2020*  . Tetanus Vaccine  11/24/2020*  . Pneumonia vaccines (2 of 2 - PCV13) 11/24/2020*  . Flu Shot  03/14/2020  . Hemoglobin A1C  04/16/2020  . Complete foot exam   08/27/2020  . Eye exam for diabetics  08/28/2020  . Urine Protein Check  10/14/2020  *Topic was postponed. The date shown is not the original due date.

## 2019-12-15 ENCOUNTER — Telehealth: Payer: Self-pay | Admitting: Internal Medicine

## 2019-12-15 MED ORDER — PEN NEEDLES 31G X 8 MM MISC: 1.0000 "pen " | Freq: Two times a day (BID) | 3 refills | Status: DC | PRN

## 2019-12-15 NOTE — Telephone Encounter (Signed)
Pt needs a refill on pen needles for levemir sent to [facility]

## 2019-12-15 NOTE — Addendum Note (Signed)
Addended by: Elpidio Galea T on: 12/15/2019 04:20 PM   Modules accepted: Orders

## 2019-12-16 DIAGNOSIS — H401112 Primary open-angle glaucoma, right eye, moderate stage: Secondary | ICD-10-CM | POA: Diagnosis not present

## 2019-12-18 ENCOUNTER — Ambulatory Visit: Payer: Medicare Other | Admitting: Podiatry

## 2019-12-24 ENCOUNTER — Other Ambulatory Visit: Payer: Self-pay

## 2019-12-26 ENCOUNTER — Other Ambulatory Visit: Payer: Self-pay

## 2019-12-26 ENCOUNTER — Ambulatory Visit: Payer: Medicare Other | Admitting: Internal Medicine

## 2019-12-26 ENCOUNTER — Ambulatory Visit (INDEPENDENT_AMBULATORY_CARE_PROVIDER_SITE_OTHER): Payer: Medicare Other | Admitting: Internal Medicine

## 2019-12-26 ENCOUNTER — Encounter: Payer: Self-pay | Admitting: Internal Medicine

## 2019-12-26 VITALS — BP 152/80 | HR 76 | Temp 97.5°F | Ht 65.0 in | Wt 190.0 lb

## 2019-12-26 DIAGNOSIS — D649 Anemia, unspecified: Secondary | ICD-10-CM

## 2019-12-26 DIAGNOSIS — N184 Chronic kidney disease, stage 4 (severe): Secondary | ICD-10-CM | POA: Diagnosis not present

## 2019-12-26 DIAGNOSIS — I10 Essential (primary) hypertension: Secondary | ICD-10-CM | POA: Diagnosis not present

## 2019-12-26 DIAGNOSIS — N1832 Chronic kidney disease, stage 3b: Secondary | ICD-10-CM

## 2019-12-26 DIAGNOSIS — E1159 Type 2 diabetes mellitus with other circulatory complications: Secondary | ICD-10-CM

## 2019-12-26 DIAGNOSIS — M5416 Radiculopathy, lumbar region: Secondary | ICD-10-CM

## 2019-12-26 DIAGNOSIS — N3000 Acute cystitis without hematuria: Secondary | ICD-10-CM

## 2019-12-26 DIAGNOSIS — R413 Other amnesia: Secondary | ICD-10-CM

## 2019-12-26 DIAGNOSIS — R251 Tremor, unspecified: Secondary | ICD-10-CM

## 2019-12-26 DIAGNOSIS — I152 Hypertension secondary to endocrine disorders: Secondary | ICD-10-CM

## 2019-12-26 MED ORDER — AMLODIPINE BESYLATE 10 MG PO TABS: 10.0000 mg | ORAL_TABLET | Freq: Every day | ORAL | 3 refills | Status: DC

## 2019-12-26 NOTE — Progress Notes (Signed)
Patient states she ran out of her Mealtime Humalog insulin and has been taking the Levemir insulin in it's place along with the prescribed daily dose.

## 2019-12-26 NOTE — Progress Notes (Signed)
Chief Complaint  Patient presents with  . Altered Mental Status    Pt states that she is feeling foggy headed. States she was sitting on the side of her bed one morning, had not stood up yet, and next thing she was on the floor.   . Back Pain    rated a 7/10. States she has tried everything over the counter and nothing is helping. She would like to discuss the MRI results done on her back.   . Knee Pain  . Tremors    Recently starting when patient holds thing. Bilateral hand tremors   F/u  1. C/o memory loss being more forgetfull and brain fog and tremor b/l hands worse x 1 month with holding objection tremor declines neurology and MRI brain for now  2. 10/15/19 9.1 DM 2 with HTN uncontrolled on insulin needs refill of humalog SSI 15 units in the am and 30 units qhs will return for fasting labs  3. Chronic pain in knees and low back review of MRI pain 7/10 low back pain radiates to left leg and has cramping disc PM&R. Sitting ok but walking and cleaning worse at times left leg drops   Abnormal MRI 11/19/19  FINDINGS: Segmentation: For the purposes of this dictation, the most caudal lumbar type vertebral body is designated L5.  Alignment: Grade 1 anterolisthesis at L4-L5. Lordosis is preserved.  Vertebrae: Chronic loss of height at the superior endplate of L2. No marrow edema or suspicious osseous lesion.  Conus medullaris and cauda equina: Conus extends to the L2-L3 level, lower limit of normal. Conus and cauda equina appear normal.  Paraspinal and other soft tissues: Unremarkable.  Disc levels:  L1-L2: Minimal disc bulge. Minor facet arthropathy. No significant canal or foraminal stenosis.  L2-L3: Minimal disc bulge with superimposed small left foraminal protrusion. Minor facet arthropathy. No significant canal or foraminal stenosis.  L3-L4: Mild disc bulge. Mild facet arthropathy. No significant canal or foraminal stenosis.  L4-L5: Anterolisthesis with uncovering of  disc bulge and superimposed left subarticular protrusion, small endplate osteophytes, and marked facet arthropathy with ligamentum flavum infolding. No significant canal stenosis. Narrowing of the left lateral recess with mass effect on the traversing left L5 nerve root. Mild to moderate foraminal stenosis, left greater than right.  L5-S1: Minimal disc bulge eccentric to the left. Marked facet arthropathy. No significant canal or foraminal stenosis.  IMPRESSION: Multilevel degenerative changes as detailed above, greatest at L4-L5. There may be left L5 nerve root compression at this level.   4. HTN BP elevated this on on lasix 40, labetatol 600 mg bid and norvasc 10 she has been out of norvasc and needs refill   Review of Systems  Constitutional: Negative for weight loss.  HENT: Negative for hearing loss.   Respiratory: Negative for shortness of breath.   Cardiovascular: Negative for chest pain.  Gastrointestinal: Positive for constipation.  Musculoskeletal: Positive for back pain and joint pain.  Skin: Negative for rash.  Neurological: Positive for tremors.  Psychiatric/Behavioral: Positive for memory loss. Negative for depression.   Past Medical History:  Diagnosis Date  . Chronic kidney disease   . Coronary artery disease    MI, pci to mid LAD 2014. LHC 80%OM, 60%RCA, 90% LAD  . Diabetes mellitus without complication ([place])   . Hypertension   . Sleep apnea    Past Surgical History:  Procedure Laterality Date  . ACNE CYST REMOVAL  2008   Groin area  . CARDIAC CATHETERIZATION    .  CORONARY ANGIOPLASTY     Family History  Problem Relation Age of Onset  . Diabetes Mother   . Heart disease Father    Social History   Socioeconomic History  . Marital status: Single    Spouse name: Not on file  . Number of children: Not on file  . Years of education: Not on file  . Highest education level: Not on file  Occupational History  . Not on file  Tobacco Use  .  Smoking status: Never Smoker  . Smokeless tobacco: Never Used  Substance and Sexual Activity  . Alcohol use: Never  . Drug use: Never  . Sexual activity: Not on file  Other Topics Concern  . Not on file  Social History Narrative   Used to live in [place] Determinants of Health   Financial Resource Strain:   . Difficulty of Paying Living Expenses:   Food Insecurity:   . Worried About Charity fundraiser in the Last Year:   . Arboriculturist in the Last Year:   Transportation Needs:   . Film/video editor (Medical):   [name] Lack of Transportation (Non-Medical):   Physical Activity:   . Days of Exercise per Week:   . Minutes of Exercise per Session:   Stress:   . Feeling of Stress :   Social Connections:   . Frequency of Communication with Friends and Family:   . Frequency of Social Gatherings with Friends and Family:   . Attends Religious Services:   . Active Member of Clubs or Organizations:   . Attends Archivist Meetings:   [name] Marital Status:   Intimate Partner Violence:   . Fear of Current or Ex-Partner:   . Emotionally Abused:   [name] Physically Abused:   . Sexually Abused:    Current Meds  Medication Sig  . acetaminophen (TYLENOL) 650 MG CR tablet Take 650 mg by mouth 3 (three) times daily as needed.  [name] aspirin EC 81 MG tablet Take 81 mg by mouth daily.  [name] atorvastatin (LIPITOR) 40 MG tablet Take 1 tablet (40 mg total) by mouth daily.  . furosemide (LASIX) 40 MG tablet Take 40 mg by mouth.  . Glucose Blood (BLOOD GLUCOSE TEST STRIPS) STRP E11.9 check sugar tid for designated machine  . insulin detemir (LEVEMIR FLEXTOUCH) 100 UNIT/ML FlexPen 15  units in am and 30 units qhs with food  . Insulin Pen Needle (PEN NEEDLES) 31G X 8 MM MISC 1 pen by Does not apply route 2 (two) times daily as needed.  . labetalol (NORMODYNE) 300 MG tablet Take 1 tablet (300 mg total) by mouth 2 (two) times daily.  [name] latanoprost (XALATAN) 0.005 % ophthalmic solution Place 1 drop  into the left eye at bedtime.  . Loratadine (CLARITIN PO) Take by mouth.  [name] MAGNESIUM SULFATE PO Take 500 mg by mouth.  . sodium chloride (OCEAN) 0.65 % SOLN nasal spray Place 2 sprays into both nostrils daily as needed for congestion.  . Turmeric (QC TUMERIC COMPLEX PO) Take by mouth.  . Vitamin D, Ergocalciferol, (DRISDOL) 1.25 MG (50000 UT) CAPS capsule Take 50,000 Units by mouth every 7 (seven) days.   Allergies  Allergen Reactions  . Amoxicillin Itching   Recent Results (from the past 2160 hour(s))  Urine Culture     Status: None   Collection Time: 10/29/19  2:14 PM   Specimen: Urine  Result Value Ref Range   MICRO NUMBER: [id number]  SPECIMEN QUALITY: Adequate    Sample Source NOT GIVEN    STATUS: FINAL    ISOLATE 1:      Greater than 100,000 CFU/mL of Non-uropathogenic Gram positive organism May represent colonizers from external and internal genitalia. No further testing (including susceptibility) will be performed.  CBC w/Diff     Status: Abnormal   Collection Time: 01/15/20 10:23 AM  Result Value Ref Range   WBC 8.0 4.0 - 10.5 K/uL   RBC 3.71 (L) 3.87 - 5.11 Mil/uL   Hemoglobin 10.9 (L) 12.0 - 15.0 g/dL   HCT 32.8 (L) 36.0 - 46.0 %   MCV 88.3 78.0 - 100.0 fl   MCHC 33.1 30.0 - 36.0 g/dL   RDW 14.4 11.5 - 15.5 %   Platelets 305.0 150.0 - 400.0 K/uL   Neutrophils Relative % 58.8 43.0 - 77.0 %   Lymphocytes Relative 30.4 12.0 - 46.0 %   Monocytes Relative 8.2 3.0 - 12.0 %   Eosinophils Relative 2.0 0.0 - 5.0 %   Basophils Relative 0.6 0.0 - 3.0 %   Neutro Abs 4.7 1.4 - 7.7 K/uL   Lymphs Abs 2.4 0.7 - 4.0 K/uL   Monocytes Absolute 0.7 0.1 - 1.0 K/uL   Eosinophils Absolute 0.2 0.0 - 0.7 K/uL   Basophils Absolute 0.0 0.0 - 0.1 K/uL  HgB A1c     Status: Abnormal   Collection Time: 01/15/20 10:23 AM  Result Value Ref Range   Hgb A1c MFr Bld 9.9 (H) 4.6 - 6.5 %    Comment: Glycemic Control Guidelines for People with Diabetes:Non Diabetic:  <6%Goal of Therapy:  <7%Additional Action Suggested:  >8%   Comprehensive metabolic panel     Status: Abnormal   Collection Time: 01/15/20 10:23 AM  Result Value Ref Range   Sodium 140 135 - 145 mEq/L   Potassium 3.9 3.5 - 5.1 mEq/L   Chloride 103 96 - 112 mEq/L   CO2 30 19 - 32 mEq/L   Glucose, Bld 150 (H) 70 - 99 mg/dL   BUN 23 6 - 23 mg/dL   Creatinine, Ser 1.53 (H) 0.40 - 1.20 mg/dL   Total Bilirubin 0.3 0.2 - 1.2 mg/dL   Alkaline Phosphatase 86 39 - 117 U/L   AST 13 0 - 37 U/L   ALT 9 0 - 35 U/L   Total Protein 6.8 6.0 - 8.3 g/dL   Albumin 3.9 3.5 - 5.2 g/dL   GFR 32.81 (L) >60.00 mL/min   Calcium 9.6 8.4 - 10.5 mg/dL  Urine Culture     Status: None   Collection Time: 01/15/20 10:24 AM   Specimen: Urine  Result Value Ref Range   MICRO NUMBER: [id number]    SPECIMEN QUALITY: Adequate    Sample Source NOT GIVEN    STATUS: FINAL    ISOLATE 1:      Greater than 100,000 CFU/mL of Non-uropathogenic Gram positive organism May represent colonizers from external and internal genitalia. No further testing (including susceptibility) will be performed.   Objective  Body mass index is 31.62 kg/m. Wt Readings from Last 3 Encounters:  12/26/19 190 lb (86.2 kg)  11/25/19 184 lb (83.5 kg)  11/21/19 184 lb 3.2 oz (83.6 kg)   Temp Readings from Last 3 Encounters:  12/26/19 (!) 97.5 F (36.4 C) (Temporal)  11/21/19 98.2 F (36.8 C) (Temporal)  09/30/19 97.7 F (36.5 C) (Oral)   BP Readings from Last 3 Encounters:  01/15/20 117/63  12/26/19 (!) 152/80  11/25/19 132/64  Pulse Readings from Last 3 Encounters:  01/15/20 69  12/26/19 76  11/25/19 72    Physical Exam Vitals and nursing note reviewed.  Constitutional:      Appearance: Normal appearance. She is well-developed and well-groomed. She is obese.  HENT:     Head: Normocephalic and atraumatic.  Eyes:     Conjunctiva/sclera: Conjunctivae normal.     Pupils: Pupils are equal, round, and reactive to light.  Cardiovascular:     Rate and  Rhythm: Normal rate and regular rhythm.     Heart sounds: Normal heart sounds. No murmur.  Pulmonary:     Effort: Pulmonary effort is normal.     Breath sounds: Normal breath sounds.  Musculoskeletal:     Right lower leg: 1+ Edema present.     Left lower leg: 1+ Edema present.  Skin:    General: Skin is warm and dry.  Neurological:     General: No focal deficit present.     Mental Status: She is alert and oriented to person, place, and time. Mental status is at baseline.     Gait: Gait normal.  Psychiatric:        Attention and Perception: Attention and perception normal.        Mood and Affect: Mood and affect normal.        Speech: Speech normal.        Behavior: Behavior normal. Behavior is cooperative.        Thought Content: Thought content normal.        Cognition and Memory: Cognition and memory normal.        Judgment: Judgment normal.     Assessment  Plan  Essential hypertension - Plan: amLODipine (NORVASC) 10 MG tablet Labetalol 600 mg bid  Lasix  Encouraged compliance Cont other meds   Acute cystitis without hematuria - Plan: UA/culture  Type 2 diabetes mellitus with vascular disease/HTN associated comorbidities - Plan:  Cont meds  Check fasting labs asap  rec better control A1C for CKD 3a/b at times 4   Stage 3ab chronic kidney disease associated with anemia and HTN F/u with renal  Control DM 2   Memory loss/tremor Declines neurology/MRI brain for now  Left lumbar radiculopathy Reviewed MRI  Disc PM&R for injections declines for now   HM Flu utd  pna 23 utd  Consider Prevnar, tdap, shingrix vaccine in the future  covid 19 vx utd 2/2   Hep C neg 06/10/19 Carotid artery plaque b/l  DEXA 11/20/16 osteopenia  Out of pap window Colonoscopy none on file consider cologuard in the future Mammogram 10/26/16 negative, consider in future disc at f/u    07/08/16 lumbar mild L4/5 mild disc narrowing Grade I anterolisthesis L4 and L5 with associated disc  space narrowing without evidence of fracture. Arthritis 08/12/14 degenerative changes  CT scan 07/24/16 negative  ABI right 0.64, left 0.62 moderate 05/30/17 moderate PVD 05/25/17 left maxillary sinus disease  Provider: Dr. Olivia Mackie McLean-Scocuzza-Internal Medicine

## 2019-12-26 NOTE — Patient Instructions (Addendum)
Consider Physical therapy [facility] [phone] or [facility] [phone] Goal blood pressure <130/80 I will take 135 on top  Debrox ear wax drops Too low is <90/<60  Low Back Sprain or Strain Rehab Ask your health care provider which exercises are safe for you. Do exercises exactly as told by your health care provider and adjust them as directed. It is normal to feel mild stretching, pulling, tightness, or discomfort as you do these exercises. Stop right away if you feel sudden pain or your pain gets worse. Do not begin these exercises until told by your health care provider. Stretching and range-of-motion exercises These exercises warm up your muscles and joints and improve the movement and flexibility of your back. These exercises also help to relieve pain, numbness, and tingling. Lumbar rotation  1. Lie on your back on a firm surface and bend your knees. 2. Straighten your arms out to your sides so each arm forms a 90-degree angle (right angle) with a side of your body. 3. Slowly move (rotate) both of your knees to one side of your body until you feel a stretch in your lower back (lumbar). Try not to let your shoulders lift off the floor. 4. Hold this position for __________ seconds. 5. Tense your abdominal muscles and slowly move your knees back to the starting position. 6. Repeat this exercise on the other side of your body. Repeat __________ times. Complete this exercise __________ times a day. Single knee to chest  1. Lie on your back on a firm surface with both legs straight. 2. Bend one of your knees. Use your hands to move your knee up toward your chest until you feel a gentle stretch in your lower back and buttock. ? Hold your leg in this position by holding on to the front of your knee. ? Keep your other leg as straight as possible. 3. Hold this position for __________ seconds. 4. Slowly return to the starting position. 5. Repeat with your other leg. Repeat __________  times. Complete this exercise __________ times a day. Prone extension on elbows  1. Lie on your abdomen on a firm surface (prone position). 2. Prop yourself up on your elbows. 3. Use your arms to help lift your chest up until you feel a gentle stretch in your abdomen and your lower back. ? This will place some of your body weight on your elbows. If this is uncomfortable, try stacking pillows under your chest. ? Your hips should stay down, against the surface that you are lying on. Keep your hip and back muscles relaxed. 4. Hold this position for __________ seconds. 5. Slowly relax your upper body and return to the starting position. Repeat __________ times. Complete this exercise __________ times a day. Strengthening exercises These exercises build strength and endurance in your back. Endurance is the ability to use your muscles for a long time, even after they get tired. Pelvic tilt This exercise strengthens the muscles that lie deep in the abdomen. 1. Lie on your back on a firm surface. Bend your knees and keep your feet flat on the floor. 2. Tense your abdominal muscles. Tip your pelvis up toward the ceiling and flatten your lower back into the floor. ? To help with this exercise, you may place a small towel under your lower back and try to push your back into the towel. 3. Hold this position for __________ seconds. 4. Let your muscles relax completely before you repeat this exercise. Repeat __________ times.  Complete this exercise __________ times a day. Alternating arm and leg raises  1. Get on your hands and knees on a firm surface. If you are on a hard floor, you may want to use padding, such as an exercise mat, to cushion your knees. 2. Line up your arms and legs. Your hands should be directly below your shoulders, and your knees should be directly below your hips. 3. Lift your left leg behind you. At the same time, raise your right arm and straighten it in front of you. ? Do not  lift your leg higher than your hip. ? Do not lift your arm higher than your shoulder. ? Keep your abdominal and back muscles tight. ? Keep your hips facing the ground. ? Do not arch your back. ? Keep your balance carefully, and do not hold your breath. 4. Hold this position for __________ seconds. 5. Slowly return to the starting position. 6. Repeat with your right leg and your left arm. Repeat __________ times. Complete this exercise __________ times a day. Abdominal set with straight leg raise  1. Lie on your back on a firm surface. 2. Bend one of your knees and keep your other leg straight. 3. Tense your abdominal muscles and lift your straight leg up, 4-6 inches (10-15 cm) off the ground. 4. Keep your abdominal muscles tight and hold this position for __________ seconds. ? Do not hold your breath. ? Do not arch your back. Keep it flat against the ground. 5. Keep your abdominal muscles tense as you slowly lower your leg back to the starting position. 6. Repeat with your other leg. Repeat __________ times. Complete this exercise __________ times a day. Single leg lower with bent knees 1. Lie on your back on a firm surface. 2. Tense your abdominal muscles and lift your feet off the floor, one foot at a time, so your knees and hips are bent in 90-degree angles (right angles). ? Your knees should be over your hips and your lower legs should be parallel to the floor. 3. Keeping your abdominal muscles tense and your knee bent, slowly lower one of your legs so your toe touches the ground. 4. Lift your leg back up to return to the starting position. ? Do not hold your breath. ? Do not let your back arch. Keep your back flat against the ground. 5. Repeat with your other leg. Repeat __________ times. Complete this exercise __________ times a day. Posture and body mechanics Good posture and healthy body mechanics can help to relieve stress in your body's tissues and joints. Body mechanics  refers to the movements and positions of your body while you do your daily activities. Posture is part of body mechanics. Good posture means:  Your spine is in its natural S-curve position (neutral).  Your shoulders are pulled back slightly.  Your head is not tipped forward. Follow these guidelines to improve your posture and body mechanics in your everyday activities. Standing   When standing, keep your spine neutral and your feet about hip width apart. Keep a slight bend in your knees. Your ears, shoulders, and hips should line up.  When you do a task in which you stand in one place for a long time, place one foot up on a stable object that is 2-4 inches (5-10 cm) high, such as a footstool. This helps keep your spine neutral. Sitting   When sitting, keep your spine neutral and keep your feet flat on the floor. Use a footrest,  if necessary, and keep your thighs parallel to the floor. Avoid rounding your shoulders, and avoid tilting your head forward.  When working at a desk or a computer, keep your desk at a height where your hands are slightly lower than your elbows. Slide your chair under your desk so you are close enough to maintain good posture.  When working at a computer, place your monitor at a height where you are looking straight ahead and you do not have to tilt your head forward or downward to look at the screen. Resting  When lying down and resting, avoid positions that are most painful for you.  If you have pain with activities such as sitting, bending, stooping, or squatting, lie in a position in which your body does not bend very much. For example, avoid curling up on your side with your arms and knees near your chest (fetal position).  If you have pain with activities such as standing for a long time or reaching with your arms, lie with your spine in a neutral position and bend your knees slightly. Try the following positions: ? Lying on your side with a pillow between  your knees. ? Lying on your back with a pillow under your knees. Lifting   When lifting objects, keep your feet at least shoulder width apart and tighten your abdominal muscles.  Bend your knees and hips and keep your spine neutral. It is important to lift using the strength of your legs, not your back. Do not lock your knees straight out.  Always ask for help to lift heavy or awkward objects. This information is not intended to replace advice given to you by your health care provider. Make sure you discuss any questions you have with your health care provider. Document Revised: 11/22/2018 Document Reviewed: 08/22/2018 Elsevier Patient Education  [place].  Back Exercises The following exercises strengthen the muscles that help to support the trunk and back. They also help to keep the lower back flexible. Doing these exercises can help to prevent back pain or lessen existing pain.  If you have back pain or discomfort, try doing these exercises 2-3 times each day or as told by your health care provider.  As your pain improves, do them once each day, but increase the number of times that you repeat the steps for each exercise (do more repetitions).  To prevent the recurrence of back pain, continue to do these exercises once each day or as told by your health care provider. Do exercises exactly as told by your health care provider and adjust them as directed. It is normal to feel mild stretching, pulling, tightness, or discomfort as you do these exercises, but you should stop right away if you feel sudden pain or your pain gets worse. Exercises Single knee to chest Repeat these steps 3-5 times for each leg: 1. Lie on your back on a firm bed or the floor with your legs extended. 2. Bring one knee to your chest. Your other leg should stay extended and in contact with the floor. 3. Hold your knee in place by grabbing your knee or thigh with both hands and hold. 4. Pull on your knee  until you feel a gentle stretch in your lower back or buttocks. 5. Hold the stretch for 10-30 seconds. 6. Slowly release and straighten your leg. Pelvic tilt Repeat these steps 5-10 times: 1. Lie on your back on a firm bed or the floor with your legs extended. 2. [place]  your knees so they are pointing toward the ceiling and your feet are flat on the floor. 3. Tighten your lower abdominal muscles to press your lower back against the floor. This motion will tilt your pelvis so your tailbone points up toward the ceiling instead of pointing to your feet or the floor. 4. With gentle tension and even breathing, hold this position for 5-10 seconds. Cat-cow Repeat these steps until your lower back becomes more flexible: 1. Get into a hands-and-knees position on a firm surface. Keep your hands under your shoulders, and keep your knees under your hips. You may place padding under your knees for comfort. 2. Let your head hang down toward your chest. Contract your abdominal muscles and point your tailbone toward the floor so your lower back becomes rounded like the back of a cat. 3. Hold this position for 5 seconds. 4. Slowly lift your head, let your abdominal muscles relax and point your tailbone up toward the ceiling so your back forms a sagging arch like the back of a cow. 5. Hold this position for 5 seconds.  Press-ups Repeat these steps 5-10 times: 1. Lie on your abdomen (face-down) on the floor. 2. Place your palms near your head, about shoulder-width apart. 3. Keeping your back as relaxed as possible and keeping your hips on the floor, slowly straighten your arms to raise the top half of your body and lift your shoulders. Do not use your back muscles to raise your upper torso. You may adjust the placement of your hands to make yourself more comfortable. 4. Hold this position for 5 seconds while you keep your back relaxed. 5. Slowly return to lying flat on the floor.  Bridges Repeat these steps 10  times: 1. Lie on your back on a firm surface. 2. Bend your knees so they are pointing toward the ceiling and your feet are flat on the floor. Your arms should be flat at your sides, next to your body. 3. Tighten your buttocks muscles and lift your buttocks off the floor until your waist is at almost the same height as your knees. You should feel the muscles working in your buttocks and the back of your thighs. If you do not feel these muscles, slide your feet 1-2 inches farther away from your buttocks. 4. Hold this position for 3-5 seconds. 5. Slowly lower your hips to the starting position, and allow your buttocks muscles to relax completely. If this exercise is too easy, try doing it with your arms crossed over your chest. Abdominal crunches Repeat these steps 5-10 times: 1. Lie on your back on a firm bed or the floor with your legs extended. 2. Bend your knees so they are pointing toward the ceiling and your feet are flat on the floor. 3. Cross your arms over your chest. 4. Tip your chin slightly toward your chest without bending your neck. 5. Tighten your abdominal muscles and slowly raise your trunk (torso) high enough to lift your shoulder blades a tiny bit off the floor. Avoid raising your torso higher than that because it can put too much stress on your low back and does not help to strengthen your abdominal muscles. 6. Slowly return to your starting position. Back lifts Repeat these steps 5-10 times: 1. Lie on your abdomen (face-down) with your arms at your sides, and rest your forehead on the floor. 2. Tighten the muscles in your legs and your buttocks. 3. Slowly lift your chest off the floor while you keep  your hips pressed to the floor. Keep the back of your head in line with the curve in your back. Your eyes should be looking at the floor. 4. Hold this position for 3-5 seconds. 5. Slowly return to your starting position. Contact a health care provider if:  Your back pain or  discomfort gets much worse when you do an exercise.  Your worsening back pain or discomfort does not lessen within 2 hours after you exercise. If you have any of these problems, stop doing these exercises right away. Do not do them again unless your health care provider says that you can. Get help right away if:  You develop sudden, severe back pain. If this happens, stop doing the exercises right away. Do not do them again unless your health care provider says that you can. This information is not intended to replace advice given to you by your health care provider. Make sure you discuss any questions you have with your health care provider. Document Revised: 12/05/2018 Document Reviewed: 05/02/2018 Elsevier Patient Education  [place].    Essential Tremor A tremor is trembling or shaking that a person cannot control. Most tremors affect the hands or arms. Tremors can also affect the head, vocal cords, legs, and other parts of the body. Essential tremor is a tremor without a known cause. Usually, it occurs while a person is trying to perform an action. It tends to get worse gradually as a person ages. What are the causes? The cause of this condition is not known. What increases the risk? You are more likely to develop this condition if:  You have a family member with essential tremor.  You are age 3 or older.  You take certain medicines. What are the signs or symptoms? The main sign of a tremor is a rhythmic shaking of certain parts of your body that is uncontrolled and unintentional. You may:  Have difficulty eating with a spoon or fork.  Have difficulty writing.  Nod your head up and down or side to side.  Have a quivering voice. The shaking may:  Get worse over time.  Come and go.  Be more noticeable on one side of your body.  Get worse due to stress, fatigue, caffeine, and extreme heat or cold. How is this diagnosed? This condition may be diagnosed based  on:  Your symptoms and medical history.  A physical exam. There is no single test to diagnose an essential tremor. However, your health care provider may order tests to rule out other causes of your condition. These may include:  Blood and urine tests.  Imaging studies of your brain, such as CT scan and MRI.  A test that measures involuntary muscle movement (electromyogram). How is this treated? Treatment for essential tremor depends on the severity of the condition.  Some tremors may go away without treatment.  Mild tremors may not need treatment if they do not affect your day-to-day life.  Severe tremors may need to be treated using one or more of the following options: ? Medicines. ? Lifestyle changes. ? Occupational or physical therapy. Follow these instructions at home: Lifestyle   Do not use any products that contain nicotine or tobacco, such as cigarettes and e-cigarettes. If you need help quitting, ask your health care provider.  Limit your caffeine intake as told by your health care provider.  Try to get 8 hours of sleep each night.  Find ways to manage your stress that fits your lifestyle and personality.  Consider trying meditation or yoga.  Try to anticipate stressful situations and allow extra time to manage them.  If you are struggling emotionally with the effects of your tremor, consider working with a mental health provider. General instructions  Take over-the-counter and prescription medicines only as told by your health care provider.  Avoid extreme heat and extreme cold.  Keep all follow-up visits as told by your health care provider. This is important. Visits may include physical therapy visits. Contact a health care provider if:  You experience any changes in the location or intensity of your tremors.  You start having a tremor after starting a new medicine.  You have tremor with other symptoms, such  as: ? Numbness. ? Tingling. ? Pain. ? Weakness.  Your tremor gets worse.  Your tremor interferes with your daily life.  You feel down, blue, or sad for at least 2 weeks in a row.  Worrying about your tremor and what other people think about you interferes with your everyday life functions, including relationships, work, or school. Summary  Essential tremor is a tremor without a known cause. Usually, it occurs when you are trying to perform an action.  The cause of this condition is not known.  The main sign of a tremor is a rhythmic shaking of certain parts of your body that is uncontrolled and unintentional.  Treatment for essential tremor depends on the severity of the condition. This information is not intended to replace advice given to you by your health care provider. Make sure you discuss any questions you have with your health care provider. Document Revised: 08/10/2017 Document Reviewed: 08/10/2017 Elsevier Patient Education  2020 [employer].

## 2019-12-31 ENCOUNTER — Telehealth: Payer: Self-pay | Admitting: Internal Medicine

## 2019-12-31 DIAGNOSIS — Z794 Long term (current) use of insulin: Secondary | ICD-10-CM

## 2019-12-31 MED ORDER — INSULIN LISPRO (1 UNIT DIAL) 100 UNIT/ML (KWIKPEN): PEN_INJECTOR | SUBCUTANEOUS | 11 refills | Status: DC

## 2019-12-31 NOTE — Telephone Encounter (Signed)
Called patient pharmacy and she states they did not get the script sent in 09/30/2019. Will resend the medication.   Patient informed and verbalized understanding.

## 2019-12-31 NOTE — Telephone Encounter (Signed)
Patient states that she was informed her insulin lispro (HUMALOG KWIKPEN) 100 UNIT/ML KwikPen Still had refills. Patient states that when calling the pharmacy they stated this was discontinued.   Will contact the pharmacy to clarify.

## 2020-01-01 ENCOUNTER — Telehealth: Payer: Self-pay | Admitting: Internal Medicine

## 2020-01-01 NOTE — Telephone Encounter (Signed)
   [facility] 01/01/2020   Name: Vickie Kemp   MRN: [id number]   DOB: Nov 13, 1940   AGE: 79 y.o.   GENDER: female   PCP McLean-Scocuzza, Nino Glow, MD.   Called pt regarding Community Resource Referral for transportation. [name] stated that she is still in need of transportation services. Care Guide informed patient that there are some resources that may be available to her. Patient asked if information can be mailed to her. Care Guide informed patient that a letter will be sent with the a list of transportation services. Patient stated understanding and has no additional needs at this time. Care Guide will follow up with patient one day next week to verify that information has been received.    [place], Care Management Phone: [phone] Email: [email or web address]

## 2020-01-05 ENCOUNTER — Encounter: Payer: Self-pay | Admitting: Internal Medicine

## 2020-01-05 NOTE — Telephone Encounter (Signed)
Sent e-mail to [facility] Denisa to print and mail letter and flyer to Mrs. Eliot.   From: Audelia [email or web address]> To: O'Brien-Blaney, Denisa [email or web address]> Subject: Secure: Letter for Mrs. Goodgame   Hi Denisa,    When you get a chance can you print and mail the attached letter and flyer for Mrs. Scannell.   Thanks,   [place], Care Management Phone: [phone] Email: [email or web address]

## 2020-01-08 NOTE — Telephone Encounter (Signed)
   [facility] 01/08/2020   Name: Vickie Kemp   MRN: [id number]   DOB: 1941-06-17   AGE: 79 y.o.   GENDER: female   PCP McLean-Scocuzza, Nino Glow, MD.   Called pt regarding Community Resource Referral for transportation assistance. Patient stated that she has not received the letter in the mail with the list of transportation resources. Care Guide will follow up with patient next week to see if she has received the information.    [place], Care Management Phone: [phone] Email: [email or web address]

## 2020-01-14 NOTE — Telephone Encounter (Signed)
   [facility] 01/14/2020   Name: Vickie Kemp   MRN: [id number]   DOB: 1940-12-05   AGE: 79 y.o.   GENDER: female   PCP McLean-Scocuzza, Nino Glow, MD.   Called pt regarding Community Resource Referral for transportation. Patient stated she has not received letter with resources in the mail. Ms. Solinger asked if she can receive a copy of the letter and the flyer for [place] when she comes into the office on Thursday, January 15, 2020. Sent e-mail to Guy Sandifer, NHA in the office to print out information for patient and have it ready for her at the front office.    [place], Care Management Phone: [phone] Email: [email or web address]

## 2020-01-15 ENCOUNTER — Other Ambulatory Visit: Payer: Self-pay

## 2020-01-15 ENCOUNTER — Ambulatory Visit (INDEPENDENT_AMBULATORY_CARE_PROVIDER_SITE_OTHER): Payer: Medicare Other

## 2020-01-15 VITALS — BP 117/63 | HR 69

## 2020-01-15 DIAGNOSIS — N1832 Chronic kidney disease, stage 3b: Secondary | ICD-10-CM

## 2020-01-15 DIAGNOSIS — N3000 Acute cystitis without hematuria: Secondary | ICD-10-CM

## 2020-01-15 DIAGNOSIS — I10 Essential (primary) hypertension: Secondary | ICD-10-CM | POA: Diagnosis not present

## 2020-01-15 DIAGNOSIS — D649 Anemia, unspecified: Secondary | ICD-10-CM | POA: Diagnosis not present

## 2020-01-15 DIAGNOSIS — N184 Chronic kidney disease, stage 4 (severe): Secondary | ICD-10-CM

## 2020-01-15 DIAGNOSIS — E1159 Type 2 diabetes mellitus with other circulatory complications: Secondary | ICD-10-CM

## 2020-01-15 LAB — CBC WITH DIFFERENTIAL/PLATELET
Basophils Absolute: 0 10*3/uL (ref 0.0–0.1)
Basophils Relative: 0.6 % (ref 0.0–3.0)
Eosinophils Absolute: 0.2 10*3/uL (ref 0.0–0.7)
Eosinophils Relative: 2 % (ref 0.0–5.0)
HCT: 32.8 % — ABNORMAL LOW (ref 36.0–46.0)
Hemoglobin: 10.9 g/dL — ABNORMAL LOW (ref 12.0–15.0)
Lymphocytes Relative: 30.4 % (ref 12.0–46.0)
Lymphs Abs: 2.4 10*3/uL (ref 0.7–4.0)
MCHC: 33.1 g/dL (ref 30.0–36.0)
MCV: 88.3 fl (ref 78.0–100.0)
Monocytes Absolute: 0.7 10*3/uL (ref 0.1–1.0)
Monocytes Relative: 8.2 % (ref 3.0–12.0)
Neutro Abs: 4.7 10*3/uL (ref 1.4–7.7)
Neutrophils Relative %: 58.8 % (ref 43.0–77.0)
Platelets: 305 10*3/uL (ref 150.0–400.0)
RBC: 3.71 Mil/uL — ABNORMAL LOW (ref 3.87–5.11)
RDW: 14.4 % (ref 11.5–15.5)
WBC: 8 10*3/uL (ref 4.0–10.5)

## 2020-01-15 LAB — COMPREHENSIVE METABOLIC PANEL
ALT: 9 U/L (ref 0–35)
AST: 13 U/L (ref 0–37)
Albumin: 3.9 g/dL (ref 3.5–5.2)
Alkaline Phosphatase: 86 U/L (ref 39–117)
BUN: 23 mg/dL (ref 6–23)
CO2: 30 mEq/L (ref 19–32)
Calcium: 9.6 mg/dL (ref 8.4–10.5)
Chloride: 103 mEq/L (ref 96–112)
Creatinine, Ser: 1.53 mg/dL — ABNORMAL HIGH (ref 0.40–1.20)
GFR: 32.81 mL/min — ABNORMAL LOW (ref >60.00)
Glucose, Bld: 150 mg/dL — ABNORMAL HIGH (ref 70–99)
Potassium: 3.9 mEq/L (ref 3.5–5.1)
Sodium: 140 mEq/L (ref 135–145)
Total Bilirubin: 0.3 mg/dL (ref 0.2–1.2)
Total Protein: 6.8 g/dL (ref 6.0–8.3)

## 2020-01-15 LAB — HEMOGLOBIN A1C: Hgb A1c MFr Bld: 9.9 % — ABNORMAL HIGH (ref 4.6–6.5)

## 2020-01-15 NOTE — Addendum Note (Signed)
Addended by: Tor Netters I on: 01/15/2020 10:23 AM   Modules accepted: Orders

## 2020-01-15 NOTE — Telephone Encounter (Signed)
Sent message to [employer], [employer] Rep to print out documents for Vickie Kemp. Sent copy of letter in [employer] and [employer]. Patient will receive documents when she comes into the office today at 10 am for nurse visit. Patient has no additional needs at this time.   Closing referral pending any other needs of patient.

## 2020-01-15 NOTE — Progress Notes (Signed)
Patient is here for a BP check due to bp being high at last visit, as per patient.  Currently patients BP is 117/63 and BPM is 69.  Patient has no complaints of headaches, blurry vision, chest pain, arm pain, light headedness, dizziness, and nor jaw pain. Please see previous note for order.

## 2020-01-16 LAB — URINE CULTURE
MICRO NUMBER:: 10549084
SPECIMEN QUALITY:: ADEQUATE

## 2020-01-19 ENCOUNTER — Telehealth: Payer: Self-pay | Admitting: Pulmonary Disease

## 2020-01-19 NOTE — Telephone Encounter (Signed)
Called and spoke with patient and HST has been scheduled for Friday 01/23/2020 at 11:00 am.  Pt is aware to go to the [facility] [facility] and ask for an escort to [facility] [place] and ask for [place].  Nothing else needed at this time. Rhonda J Cobb

## 2020-01-20 ENCOUNTER — Telehealth: Payer: Self-pay | Admitting: Internal Medicine

## 2020-01-20 ENCOUNTER — Other Ambulatory Visit: Payer: Self-pay

## 2020-01-20 ENCOUNTER — Other Ambulatory Visit: Payer: Self-pay | Admitting: Internal Medicine

## 2020-01-20 DIAGNOSIS — I10 Essential (primary) hypertension: Secondary | ICD-10-CM | POA: Insufficient documentation

## 2020-01-20 DIAGNOSIS — Z794 Long term (current) use of insulin: Secondary | ICD-10-CM

## 2020-01-20 DIAGNOSIS — E1159 Type 2 diabetes mellitus with other circulatory complications: Secondary | ICD-10-CM | POA: Insufficient documentation

## 2020-01-20 DIAGNOSIS — R251 Tremor, unspecified: Secondary | ICD-10-CM | POA: Insufficient documentation

## 2020-01-20 DIAGNOSIS — R413 Other amnesia: Secondary | ICD-10-CM | POA: Insufficient documentation

## 2020-01-20 DIAGNOSIS — I152 Hypertension secondary to endocrine disorders: Secondary | ICD-10-CM | POA: Insufficient documentation

## 2020-01-20 MED ORDER — LEVEMIR FLEXTOUCH 100 UNIT/ML ~~LOC~~ SOPN: PEN_INJECTOR | SUBCUTANEOUS | 11 refills | Status: DC

## 2020-01-20 MED ORDER — LABETALOL HCL 300 MG PO TABS: 600.0000 mg | ORAL_TABLET | Freq: Two times a day (BID) | ORAL | 3 refills | Status: DC

## 2020-01-20 NOTE — Telephone Encounter (Signed)
Pt called wanted to know about her medication dosage labetalol (NORMODYNE) 300 MG tablet

## 2020-01-20 NOTE — Telephone Encounter (Signed)
Spoken to patient and fixed medication rx.

## 2020-01-22 ENCOUNTER — Other Ambulatory Visit: Payer: Self-pay

## 2020-01-22 MED ORDER — ATORVASTATIN CALCIUM 40 MG PO TABS: 40.0000 mg | ORAL_TABLET | Freq: Every day | ORAL | 3 refills | Status: DC

## 2020-01-23 ENCOUNTER — Ambulatory Visit: Payer: Medicare Other

## 2020-01-23 DIAGNOSIS — Z9189 Other specified personal risk factors, not elsewhere classified: Secondary | ICD-10-CM

## 2020-01-28 ENCOUNTER — Other Ambulatory Visit: Payer: Self-pay | Admitting: *Deleted

## 2020-01-28 MED ORDER — ATORVASTATIN CALCIUM 40 MG PO TABS: 40.0000 mg | ORAL_TABLET | Freq: Every day | ORAL | 0 refills | Status: DC

## 2020-01-29 ENCOUNTER — Telehealth: Payer: Self-pay | Admitting: Pulmonary Disease

## 2020-01-29 DIAGNOSIS — G4733 Obstructive sleep apnea (adult) (pediatric): Secondary | ICD-10-CM | POA: Diagnosis not present

## 2020-01-29 NOTE — Telephone Encounter (Signed)
Called and left message for patient to return call for home sleep study results.

## 2020-01-29 NOTE — Telephone Encounter (Signed)
Call patient  Sleep study result  Date of study: 01/23/2020  Impression: Severe obstructive sleep apnea Moderate oxygen desaturations  Recommendation: Recommend CPAP therapy for severe obstructive sleep apnea Auto titrating CPAP with pressure settings of 5-20 will be appropriate

## 2020-01-30 DIAGNOSIS — G4733 Obstructive sleep apnea (adult) (pediatric): Secondary | ICD-10-CM | POA: Diagnosis not present

## 2020-01-30 NOTE — Telephone Encounter (Signed)
Pt returning a phone call. Pt can be reached at [phone].

## 2020-01-30 NOTE — Telephone Encounter (Signed)
Called patient but the call went straight to VM. Left message for patient to call back for results.

## 2020-02-02 NOTE — Telephone Encounter (Signed)
Patient contacted with results of home sleep study. Patient agrees to start CPAP therapy. Order to DME placed. Follow up recall to see Dr. Ander Slade in. Patient verbalized understanding of results and ongoing plan of care.

## 2020-02-02 NOTE — Telephone Encounter (Signed)
Pt returning a phone call about her hst results. Pt can be reached at [phone]

## 2020-02-09 ENCOUNTER — Encounter: Payer: Self-pay | Admitting: Internal Medicine

## 2020-02-10 DIAGNOSIS — G4733 Obstructive sleep apnea (adult) (pediatric): Secondary | ICD-10-CM | POA: Diagnosis not present

## 2020-02-12 ENCOUNTER — Ambulatory Visit (INDEPENDENT_AMBULATORY_CARE_PROVIDER_SITE_OTHER): Payer: Medicare Other | Admitting: Internal Medicine

## 2020-02-12 ENCOUNTER — Encounter: Payer: Self-pay | Admitting: Internal Medicine

## 2020-02-12 ENCOUNTER — Other Ambulatory Visit: Payer: Self-pay

## 2020-02-12 VITALS — BP 142/70 | HR 73 | Temp 98.2°F | Ht 65.0 in | Wt 193.6 lb

## 2020-02-12 DIAGNOSIS — R600 Localized edema: Secondary | ICD-10-CM

## 2020-02-12 DIAGNOSIS — I10 Essential (primary) hypertension: Secondary | ICD-10-CM | POA: Diagnosis not present

## 2020-02-12 DIAGNOSIS — E1159 Type 2 diabetes mellitus with other circulatory complications: Secondary | ICD-10-CM

## 2020-02-12 DIAGNOSIS — R197 Diarrhea, unspecified: Secondary | ICD-10-CM

## 2020-02-12 DIAGNOSIS — I739 Peripheral vascular disease, unspecified: Secondary | ICD-10-CM

## 2020-02-12 DIAGNOSIS — I152 Hypertension secondary to endocrine disorders: Secondary | ICD-10-CM

## 2020-02-12 MED ORDER — INSULIN PEN NEEDLE 32G X 4 MM MISC: 1.0000 | Freq: Three times a day (TID) | 3 refills | Status: DC

## 2020-02-12 NOTE — Progress Notes (Signed)
Patient presenting with ongoing diarrhea. States that 30-45 minutes after eating she has to go. Her stools are loose, Patient states she is color blind so could not tell me what color her stools have been.

## 2020-02-12 NOTE — Progress Notes (Signed)
Chief Complaint  Patient presents with  . Diarrhea   F/u  1. Diarrhea x 2 weeks 3x per day brown, green, gray after eating tried immodium and helped stools be more liquid she had been constipated 07/2019 to 10/2019 and having stools 1x per week  She was on Abx recently for UTI 2. DM 2 doing levemir 30 u bid and SSI and monitoring cbgs and has glucose tablets for lower values closer to 70 but she at times is diet noncompiant and sugar is higher  3. HTN on norvasc 10 mg qd, labetatlol 600 mg bid, lasix 40 mg qd    Review of Systems  Constitutional: Negative for weight loss.  HENT: Negative for hearing loss.   Eyes: Negative for blurred vision.  Cardiovascular: Negative for chest pain.  Gastrointestinal: Positive for diarrhea. Negative for abdominal pain, blood in stool, constipation and nausea.  Skin: Negative for rash.   Past Medical History:  Diagnosis Date  . Chronic kidney disease   . Coronary artery disease    MI, pci to mid LAD 2014. LHC 80%OM, 60%RCA, 90% LAD  . Diabetes mellitus without complication ([place])   . Hypertension   . Sleep apnea    Past Surgical History:  Procedure Laterality Date  . ACNE CYST REMOVAL  2008   Groin area  . CARDIAC CATHETERIZATION    . CORONARY ANGIOPLASTY     Family History  Problem Relation Age of Onset  . Diabetes Mother   . Heart disease Father    Social History   Socioeconomic History  . Marital status: Single    Spouse name: Not on file  . Number of children: Not on file  . Years of education: Not on file  . Highest education level: Not on file  Occupational History  . Not on file  Tobacco Use  . Smoking status: Never Smoker  . Smokeless tobacco: Never Used  Vaping Use  . Vaping Use: Never used  Substance and Sexual Activity  . Alcohol use: Never  . Drug use: Never  . Sexual activity: Not on file  Other Topics Concern  . Not on file  Social History Narrative   Used to live in [place]  Determinants of Health   Financial Resource Strain:   . Difficulty of Paying Living Expenses:   Food Insecurity:   . Worried About Charity fundraiser in the Last Year:   . Arboriculturist in the Last Year:   Transportation Needs:   . Film/video editor (Medical):   [name] Lack of Transportation (Non-Medical):   Physical Activity:   . Days of Exercise per Week:   . Minutes of Exercise per Session:   Stress:   . Feeling of Stress :   Social Connections:   . Frequency of Communication with Friends and Family:   . Frequency of Social Gatherings with Friends and Family:   . Attends Religious Services:   . Active Member of Clubs or Organizations:   . Attends Archivist Meetings:   [name] Marital Status:   Intimate Partner Violence:   . Fear of Current or Ex-Partner:   . Emotionally Abused:   [name] Physically Abused:   . Sexually Abused:    Current Meds  Medication Sig  . acetaminophen (TYLENOL) 650 MG CR tablet Take 650 mg by mouth 3 (three) times daily as needed.  [name] amLODipine (NORVASC) 10 MG tablet Take 1 tablet (10  mg total) by mouth daily.  [name] aspirin EC 81 MG tablet Take 81 mg by mouth daily.  [name] atorvastatin (LIPITOR) 40 MG tablet Take 1 tablet (40 mg total) by mouth daily.  . furosemide (LASIX) 40 MG tablet Take 40 mg by mouth.  . Glucose Blood (BLOOD GLUCOSE TEST STRIPS) STRP E11.9 check sugar tid for designated machine  . insulin detemir (LEVEMIR FLEXTOUCH) 100 UNIT/ML FlexPen 30 units in am and 30 units qhs with food  . insulin lispro (HUMALOG KWIKPEN) 100 UNIT/ML KwikPen Prn based on sugar If sugar 70-130 0 units If sugar 131-180 4 units If sugar 181-240 8 units If sugar 241-300 10 units If sugar 301-350 12 units If sugar 351-400 16 units If >400 20 units and call doctor  . Insulin Pen Needle (PEN NEEDLES) 31G X 8 MM MISC 1 pen by Does not apply route 2 (two) times daily as needed.  . Insulin Pen Needle 32G X 4 MM MISC 1 Device by Does not apply route 4 (four) times daily -  after meals and at bedtime.  [name] labetalol (NORMODYNE) 300 MG tablet Take 2 tablets (600 mg total) by mouth 2 (two) times daily.  [name] latanoprost (XALATAN) 0.005 % ophthalmic solution Place 1 drop into the left eye at bedtime.  . Loperamide HCl (IMODIUM PO) Take by mouth.  [name] MAGNESIUM SULFATE PO Take 500 mg by mouth.  . sodium chloride (OCEAN) 0.65 % SOLN nasal spray Place 2 sprays into both nostrils daily as needed for congestion.  . Vitamin D, Ergocalciferol, (DRISDOL) 1.25 MG (50000 UT) CAPS capsule Take 50,000 Units by mouth every 7 (seven) days.   Allergies  Allergen Reactions  . Amoxicillin Itching   Recent Results (from the past 2160 hour(s))  CBC w/Diff     Status: Abnormal   Collection Time: 01/15/20 10:23 AM  Result Value Ref Range   WBC 8.0 4.0 - 10.5 K/uL   RBC 3.71 (L) 3.87 - 5.11 Mil/uL   Hemoglobin 10.9 (L) 12.0 - 15.0 g/dL   HCT 32.8 (L) 36 - 46 %   MCV 88.3 78.0 - 100.0 fl   MCHC 33.1 30.0 - 36.0 g/dL   RDW 14.4 11.5 - 15.5 %   Platelets 305.0 150 - 400 K/uL   Neutrophils Relative % 58.8 43 - 77 %   Lymphocytes Relative 30.4 12 - 46 %   Monocytes Relative 8.2 3 - 12 %   Eosinophils Relative 2.0 0 - 5 %   Basophils Relative 0.6 0 - 3 %   Neutro Abs 4.7 1.4 - 7.7 K/uL   Lymphs Abs 2.4 0.7 - 4.0 K/uL   Monocytes Absolute 0.7 0 - 1 K/uL   Eosinophils Absolute 0.2 0 - 0 K/uL   Basophils Absolute 0.0 0 - 0 K/uL  HgB A1c     Status: Abnormal   Collection Time: 01/15/20 10:23 AM  Result Value Ref Range   Hgb A1c MFr Bld 9.9 (H) 4.6 - 6.5 %    Comment: Glycemic Control Guidelines for People with Diabetes:Non Diabetic:  <6%Goal of Therapy: <7%Additional Action Suggested:  >8%   Comprehensive metabolic panel     Status: Abnormal   Collection Time: 01/15/20 10:23 AM  Result Value Ref Range   Sodium 140 135 - 145 mEq/L   Potassium 3.9 3.5 - 5.1 mEq/L   Chloride 103 96 - 112 mEq/L   CO2 30 19 - 32 mEq/L   Glucose, Bld 150 (H) 70 - 99 mg/dL  BUN 23 6 - 23 mg/dL    Creatinine, Ser 1.53 (H) 0.40 - 1.20 mg/dL   Total Bilirubin 0.3 0.2 - 1.2 mg/dL   Alkaline Phosphatase 86 39 - 117 U/L   AST 13 0 - 37 U/L   ALT 9 0 - 35 U/L   Total Protein 6.8 6.0 - 8.3 g/dL   Albumin 3.9 3.5 - 5.2 g/dL   GFR 32.81 (L) >60.00 mL/min   Calcium 9.6 8.4 - 10.5 mg/dL  Urine Culture     Status: None   Collection Time: 01/15/20 10:24 AM   Specimen: Urine  Result Value Ref Range   MICRO NUMBER: [id number]    SPECIMEN QUALITY: Adequate    Sample Source NOT GIVEN    STATUS: FINAL    ISOLATE 1:      Greater than 100,000 CFU/mL of Non-uropathogenic Gram positive organism May represent colonizers from external and internal genitalia. No further testing (including susceptibility) will be performed.   Objective  Body mass index is 32.22 kg/m. Wt Readings from Last 3 Encounters:  02/12/20 193 lb 9.6 oz (87.8 kg)  12/26/19 190 lb (86.2 kg)  11/25/19 184 lb (83.5 kg)   Temp Readings from Last 3 Encounters:  02/12/20 98.2 F (36.8 C) (Oral)  12/26/19 (!) 97.5 F (36.4 C) (Temporal)  11/21/19 98.2 F (36.8 C) (Temporal)   BP Readings from Last 3 Encounters:  02/12/20 (!) 142/70  01/15/20 117/63  12/26/19 (!) 152/80   Pulse Readings from Last 3 Encounters:  02/12/20 73  01/15/20 69  12/26/19 76    Physical Exam Vitals and nursing note reviewed.  Constitutional:      Appearance: Normal appearance. She is well-developed and well-groomed. She is obese.  HENT:     Head: Normocephalic and atraumatic.  Eyes:     Conjunctiva/sclera: Conjunctivae normal.     Pupils: Pupils are equal, round, and reactive to light.  Cardiovascular:     Rate and Rhythm: Normal rate and regular rhythm.     Heart sounds: Normal heart sounds. No murmur heard.   Pulmonary:     Effort: Pulmonary effort is normal.     Breath sounds: Normal breath sounds.  Abdominal:     Tenderness: There is no abdominal tenderness.  Skin:    General: Skin is warm and dry.  Neurological:     General:  No focal deficit present.     Mental Status: She is alert and oriented to person, place, and time. Mental status is at baseline.     Gait: Gait normal.  Psychiatric:        Attention and Perception: Attention and perception normal.        Mood and Affect: Mood and affect normal.        Speech: Speech normal.        Behavior: Behavior normal. Behavior is cooperative.        Thought Content: Thought content normal.        Cognition and Memory: Cognition and memory normal.        Judgment: Judgment normal.     Assessment  Plan  Diarrhea, unspecified type - Plan: GI pathogen panel by PCR, stool, Clostridium Difficile by PCR([facility]), Fecal leukocytes Brat diet  Prn immodium  Consider GI and CT ab/pelvis in future   Hypertension associated with diabetes ([place]) - Plan: Insulin Pen Needle 32G X 4 MM MISC  Cont meds for now on levemir 30 am and 28-30 pm  With SSI  Cont BP  meds as listed   HM Flu utd  pna 23 utd  Consider Prevnar, tdap, shingrix vaccine in the future  covid 19 vxutd 2/2   Hep C neg 06/10/19 Carotid artery plaque b/l  DEXA 11/20/16 osteopenia  Out of pap window Colonoscopynone on file consider cologuard in the future Mammogram 10/26/16 negative, declines mammogram for now as of 02/12/20  07/08/16 lumbar mild L4/5 mild disc narrowingGrade I anterolisthesis L4 and L5 with associated disc space narrowing without evidence of fracture. Arthritis 08/12/14 degenerative changes  CT scan 07/24/16 negative  ABI right 0.64, left 0.62 moderate 05/30/17 moderate PVD 05/25/17 left maxillary sinus disease Provider: Dr. Olivia Mackie McLean-Scocuzza-[facility]

## 2020-02-12 NOTE — Patient Instructions (Addendum)
Consider CT abdomen/pelvis if stool studies negative  Consider GI specialist as well   Diarrhea, Adult Diarrhea is frequent loose and watery bowel movements. Diarrhea can make you feel weak and cause you to become dehydrated. Dehydration can make you tired and thirsty, cause you to have a dry mouth, and decrease how often you urinate. Diarrhea typically lasts 2-3 days. However, it can last longer if it is a sign of something more serious. It is important to treat your diarrhea as told by your health care provider. Follow these instructions at home: Eating and drinking     Follow these recommendations as told by your health care provider:  Take an oral rehydration solution (ORS). This is an over-the-counter medicine that helps return your body to its normal balance of nutrients and water. It is found at pharmacies and retail stores.  Drink plenty of fluids, such as water, ice chips, diluted fruit juice, and low-calorie sports drinks. You can drink milk also, if desired.  Avoid drinking fluids that contain a lot of sugar or caffeine, such as energy drinks, sports drinks, and soda.  Eat bland, easy-to-digest foods in small amounts as you are able. These foods include bananas, applesauce, rice, lean meats, toast, and crackers.  Avoid alcohol.  Avoid spicy or fatty foods.  Medicines  Take over-the-counter and prescription medicines only as told by your health care provider.  If you were prescribed an antibiotic medicine, take it as told by your health care provider. Do not stop using the antibiotic even if you start to feel better. General instructions   Wash your hands often using soap and water. If soap and water are not available, use a hand sanitizer. Others in the household should wash their hands as well. Hands should be washed: ? After using the toilet or changing a diaper. ? Before preparing, cooking, or serving food. ? While caring for a sick person or while visiting someone  in a hospital.  Drink enough fluid to keep your urine pale yellow.  Rest at home while you recover.  Watch your condition for any changes.  Take a warm bath to relieve any burning or pain from frequent diarrhea episodes.  Keep all follow-up visits as told by your health care provider. This is important. Contact a health care provider if:  You have a fever.  Your diarrhea gets worse.  You have new symptoms.  You cannot keep fluids down.  You feel light-headed or dizzy.  You have a headache.  You have muscle cramps. Get help right away if:  You have chest pain.  You feel extremely weak or you faint.  You have bloody or black stools or stools that look like tar.  You have severe pain, cramping, or bloating in your abdomen.  You have trouble breathing or you are breathing very quickly.  Your heart is beating very quickly.  Your skin feels cold and clammy.  You feel confused.  You have signs of dehydration, such as: ? Dark urine, very little urine, or no urine. ? Cracked lips. ? Dry mouth. ? Sunken eyes. ? Sleepiness. ? Weakness. Summary  Diarrhea is frequent loose and watery bowel movements. Diarrhea can make you feel weak and cause you to become dehydrated.  Drink enough fluids to keep your urine pale yellow.  Make sure that you wash your hands after using the toilet. If soap and water are not available, use hand sanitizer.  Contact a health care provider if your diarrhea gets worse or you  have new symptoms.  Get help right away if you have signs of dehydration. This information is not intended to replace advice given to you by your health care provider. Make sure you discuss any questions you have with your health care provider. Document Revised: 12/17/2018 Document Reviewed: 01/04/2018 Elsevier Patient Education  [place].  Hypoglycemia Hypoglycemia occurs when the level of sugar (glucose) in the blood is too low. Hypoglycemia can happen in  people who do or do not have diabetes. It can develop quickly, and it can be a medical emergency. For most people with diabetes, a blood glucose level below 70 mg/dL (3.9 mmol/L) is considered hypoglycemia. Glucose is a type of sugar that provides the body's main source of energy. Certain hormones (insulin and glucagon) control the level of glucose in the blood. Insulin lowers blood glucose, and glucagon raises blood glucose. Hypoglycemia can result from having too much insulin in the bloodstream, or from not eating enough food that contains glucose. You may also have reactive hypoglycemia, which happens within 4 hours after eating a meal. What are the causes? Hypoglycemia occurs most often in people who have diabetes and may be caused by:  Diabetes medicine.  Not eating enough, or not eating often enough.  Increased physical activity.  Drinking alcohol on an empty stomach. If you do not have diabetes, hypoglycemia may be caused by:  A tumor in the pancreas.  Not eating enough, or not eating for long periods at a time (fasting).  A severe infection or illness.  Certain medicines. What increases the risk? Hypoglycemia is more likely to develop in:  People who have diabetes and take medicines to lower blood glucose.  People who abuse alcohol.  People who have a severe illness. What are the signs or symptoms? Mild symptoms Mild hypoglycemia may not cause any symptoms. If you do have symptoms, they may include:  Hunger.  Anxiety.  Sweating and feeling clammy.  Dizziness or feeling light-headed.  Sleepiness.  Nausea.  Increased heart rate.  Headache.  Blurry vision.  Irritability.  Tingling or numbness around the mouth, lips, or tongue.  A change in coordination.  Restless sleep. Moderate symptoms Moderate hypoglycemia can cause:  Mental confusion and poor judgment.  Behavior changes.  Weakness.  Irregular heartbeat. Severe symptoms Severe hypoglycemia  is a medical emergency. It can cause:  Fainting.  Seizures.  Loss of consciousness (coma).  Death. How is this diagnosed? Hypoglycemia is diagnosed with a blood test to measure your blood glucose level. This blood test is done while you are having symptoms. Your health care provider may also do a physical exam and review your medical history. How is this treated? This condition can often be treated by immediately eating or drinking something that contains sugar, such as:  Fruit juice, 4-6 oz (120-150 mL).  Regular soda (not diet soda), 4-6 oz (120-150 mL).  Low-fat milk, 4 oz (120 mL).  Several pieces of hard candy.  Sugar or honey, 1 Tbsp (15 mL). Treating hypoglycemia if you have diabetes If you are alert and able to swallow safely, follow the 15:15 rule:  Take 15 grams of a rapid-acting carbohydrate. Talk with your health care provider about how much you should take.  Rapid-acting options include: ? Glucose pills (take 15 grams). ? 6-8 pieces of hard candy. ? 4-6 oz (120-150 mL) of fruit juice. ? 4-6 oz (120-150 mL) of regular (not diet) soda. ? 1 Tbsp (15 mL) honey or sugar.  Check your blood glucose  15 minutes after you take the carbohydrate.  If the repeat blood glucose level is still at or below 70 mg/dL (3.9 mmol/L), take 15 grams of a carbohydrate again.  If your blood glucose level does not increase above 70 mg/dL (3.9 mmol/L) after 3 tries, seek emergency medical care.  After your blood glucose level returns to normal, eat a meal or a snack within 1 hour.  Treating severe hypoglycemia Severe hypoglycemia is when your blood glucose level is at or below 54 mg/dL (3 mmol/L). Severe hypoglycemia is a medical emergency. Get medical help right away. If you have severe hypoglycemia and you cannot eat or drink, you may need an injection of glucagon. A family member or close friend should learn how to check your blood glucose and how to give you a glucagon injection.  Ask your health care provider if you need to have an emergency glucagon injection kit available. Severe hypoglycemia may need to be treated in a hospital. The treatment may include getting glucose through an IV. You may also need treatment for the cause of your hypoglycemia. Follow these instructions at home:  General instructions  Take over-the-counter and prescription medicines only as told by your health care provider.  Monitor your blood glucose as told by your health care provider.  Limit alcohol intake to no more than 1 drink a day for nonpregnant women and 2 drinks a day for men. One drink equals 12 oz of beer (355 mL), 5 oz of wine (148 mL), or 1 oz of hard liquor (44 mL).  Keep all follow-up visits as told by your health care provider. This is important. If you have diabetes:  Always have a rapid-acting carbohydrate snack with you to treat low blood glucose.  Follow your diabetes management plan as directed. Make sure you: ? Know the symptoms of hypoglycemia. It is important to treat it right away to prevent it from becoming severe. ? Take your medicines as directed. ? Follow your exercise plan. ? Follow your meal plan. Eat on time, and do not skip meals. ? Check your blood glucose as often as directed. Always check before and after exercise. ? Follow your sick day plan whenever you cannot eat or drink normally. Make this plan in advance with your health care provider.  Share your diabetes management plan with people in your workplace, school, and household.  Check your urine for ketones when you are ill and as told by your health care provider.  Carry a medical alert card or wear medical alert jewelry. Contact a health care provider if:  You have problems keeping your blood glucose in your target range.  You have frequent episodes of hypoglycemia. Get help right away if:  You continue to have hypoglycemia symptoms after eating or drinking something containing  glucose.  Your blood glucose is at or below 54 mg/dL (3 mmol/L).  You have a seizure.  You faint. These symptoms may represent a serious problem that is an emergency. Do not wait to see if the symptoms will go away. Get medical help right away. Call your local emergency services (911 in the [place] Summary  Hypoglycemia occurs when the level of sugar (glucose) in the blood is too low.  Hypoglycemia can happen in people who do or do not have diabetes. It can develop quickly, and it can be a medical emergency.  Make sure you know the symptoms of hypoglycemia and how to treat it.  Always have a rapid-acting carbohydrate snack with you to  treat low blood sugar. This information is not intended to replace advice given to you by your health care provider. Make sure you discuss any questions you have with your health care provider. Document Revised: 01/21/2018 Document Reviewed: 09/03/2015 Elsevier Patient Education  2020 [place] Choices to Help Relieve Diarrhea, Adult When you have diarrhea, the foods you eat and your eating habits are very important. Choosing the right foods and drinks can help:  Relieve diarrhea.  Replace lost fluids and nutrients.  Prevent dehydration. What general guidelines should I follow?  Relieving diarrhea  Choose foods with less than 2 g or .07 oz. of fiber per serving.  Limit fats to less than 8 tsp (38 g or 1.34 oz.) a day.  Avoid the following: ? Foods and beverages sweetened with high-fructose corn syrup, honey, or sugar alcohols such as xylitol, sorbitol, and mannitol. ? Foods that contain a lot of fat or sugar. ? Fried, greasy, or spicy foods. ? High-fiber grains, breads, and cereals. ? Raw fruits and vegetables.  Eat foods that are rich in probiotics. These foods include dairy products such as yogurt and fermented milk products. They help increase healthy bacteria in the stomach and intestines (gastrointestinal tract, or GI  tract).  If you have lactose intolerance, avoid dairy products. These may make your diarrhea worse.  Take medicine to help stop diarrhea (antidiarrheal medicine) only as told by your health care provider. Replacing nutrients  Eat small meals or snacks every 3-4 hours.  Eat bland foods, such as white rice, toast, or baked potato, until your diarrhea starts to get better. Gradually reintroduce nutrient-rich foods as tolerated or as told by your health care provider. This includes: ? Well-cooked protein foods. ? Peeled, seeded, and soft-cooked fruits and vegetables. ? Low-fat dairy products.  Take vitamin and mineral supplements as told by your health care provider. Preventing dehydration  Start by sipping water or a special solution to prevent dehydration (oral rehydration solution, ORS). Urine that is clear or pale yellow means that you are getting enough fluid.  Try to drink at least 8-10 cups of fluid each day to help replace lost fluids.  You may add other liquids in addition to water, such as clear juice or decaffeinated sports drinks, as tolerated or as told by your health care provider.  Avoid drinks with caffeine, such as coffee, tea, or soft drinks.  Avoid alcohol. What foods are recommended?     The items listed may not be a complete list. Talk with your health care provider about what dietary choices are best for you. Grains White rice. White, [place], or pita breads (fresh or toasted), including plain rolls, buns, or bagels. White pasta. Saltine, soda, or graham crackers. Pretzels. Low-fiber cereal. Cooked cereals made with water (such as cornmeal, farina, or cream cereals). Plain muffins. Matzo. Melba toast. Zwieback. Vegetables Potatoes (without the skin). Most well-cooked and canned vegetables without skins or seeds. Tender lettuce. Fruits Apple sauce. Fruits canned in juice. Cooked apricots, cherries, grapefruit, peaches, pears, or plums. Fresh bananas and  cantaloupe. Meats and other protein foods Baked or boiled chicken. Eggs. Tofu. Fish. Seafood. Smooth nut butters. Ground or well-cooked tender beef, ham, veal, lamb, pork, or poultry. Dairy Plain yogurt, kefir, and unsweetened liquid yogurt. Lactose-free milk, buttermilk, skim milk, or soy milk. Low-fat or nonfat hard cheese. Beverages Water. Low-calorie sports drinks. Fruit juices without pulp. Strained tomato and vegetable juices. Decaffeinated teas. Sugar-free beverages not sweetened with sugar alcohols. Oral rehydration  solutions, if approved by your health care provider. Seasoning and other foods Bouillon, broth, or soups made from recommended foods. What foods are not recommended? The items listed may not be a complete list. Talk with your health care provider about what dietary choices are best for you. Grains Whole grain, whole wheat, bran, or rye breads, rolls, pastas, and crackers. Wild or brown rice. Whole grain or bran cereals. Barley. Oats and oatmeal. Corn tortillas or taco shells. Granola. Popcorn. Vegetables Raw vegetables. Fried vegetables. Cabbage, broccoli, Brussels sprouts, artichokes, baked beans, beet greens, corn, kale, legumes, peas, sweet potatoes, and yams. Potato skins. Cooked spinach and cabbage. Fruits Dried fruit, including raisins and dates. Raw fruits. Stewed or dried prunes. Canned fruits with syrup. Meat and other protein foods Fried or fatty meats. Deli meats. Chunky nut butters. Nuts and seeds. Beans and lentils. Berniece Salines. Hot dogs. Sausage. Dairy High-fat cheeses. Whole milk, chocolate milk, and beverages made with milk, such as milk shakes. Half-and-half. Cream. sour cream. Ice cream. Beverages Caffeinated beverages (such as coffee, tea, soda, or energy drinks). Alcoholic beverages. Fruit juices with pulp. Prune juice. Soft drinks sweetened with high-fructose corn syrup or sugar alcohols. High-calorie sports drinks. Fats and oils Butter. Cream sauces.  Margarine. Salad oils. Plain salad dressings. Olives. Avocados. Mayonnaise. Sweets and desserts Sweet rolls, doughnuts, and sweet breads. Sugar-free desserts sweetened with sugar alcohols such as xylitol and sorbitol. Seasoning and other foods Honey. Hot sauce. Chili powder. Gravy. Cream-based or milk-based soups. Pancakes and waffles. Summary  When you have diarrhea, the foods you eat and your eating habits are very important.  Make sure you get at least 8-10 cups of fluid each day, or enough to keep your urine clear or pale yellow.  Eat bland foods and gradually reintroduce healthy, nutrient-rich foods as tolerated, or as told by your health care provider.  Avoid high-fiber, fried, greasy, or spicy foods. This information is not intended to replace advice given to you by your health care provider. Make sure you discuss any questions you have with your health care provider. Document Revised: 11/21/2018 Document Reviewed: 07/28/2016 Elsevier Patient Education  [place].

## 2020-02-13 ENCOUNTER — Other Ambulatory Visit
Admission: RE | Admit: 2020-02-13 | Discharge: 2020-02-13 | Disposition: A | Discharge status: Home / Self Care | Payer: Medicare Other | Source: Ambulatory Visit | Attending: Internal Medicine | Admitting: Internal Medicine

## 2020-02-13 DIAGNOSIS — R197 Diarrhea, unspecified: Secondary | ICD-10-CM | POA: Insufficient documentation

## 2020-02-13 LAB — C DIFFICILE QUICK SCREEN W PCR REFLEX
C Diff antigen: NEGATIVE
C Diff interpretation: NOT DETECTED
C Diff toxin: NEGATIVE

## 2020-02-15 LAB — GI PATHOGEN PANEL BY PCR, STOOL

## 2020-02-17 NOTE — Telephone Encounter (Signed)
-----   Message from Delorise Jackson, MD sent at 02/16/2020  7:37 PM EDT ----- No infectious reason for diarrhea  Does she want to see GI doctor for further work up?

## 2020-02-29 ENCOUNTER — Encounter: Payer: Self-pay | Admitting: Internal Medicine

## 2020-02-29 DIAGNOSIS — G4733 Obstructive sleep apnea (adult) (pediatric): Secondary | ICD-10-CM | POA: Insufficient documentation

## 2020-03-05 ENCOUNTER — Encounter: Payer: Self-pay | Admitting: Pulmonary Disease

## 2020-03-05 ENCOUNTER — Ambulatory Visit: Payer: Medicare Other | Admitting: Pulmonary Disease

## 2020-03-05 ENCOUNTER — Other Ambulatory Visit: Payer: Self-pay

## 2020-03-05 VITALS — BP 146/78 | HR 65 | Temp 97.8°F | Ht 65.0 in | Wt 192.8 lb

## 2020-03-05 DIAGNOSIS — Z9989 Dependence on other enabling machines and devices: Secondary | ICD-10-CM | POA: Diagnosis not present

## 2020-03-05 DIAGNOSIS — G4733 Obstructive sleep apnea (adult) (pediatric): Secondary | ICD-10-CM | POA: Diagnosis not present

## 2020-03-05 NOTE — Patient Instructions (Addendum)
You were seen today by Lauraine Rinne, NP  for:   1. OSA on CPAP  We will send a message to your DME company to refill your supplies We will change her pressure settings to 5-15 Work on using your CPAP every night and using it for over 4 hours  We recommend that you continue using your CPAP daily >>>Keep up the hard work using your device >>> Goal should be wearing this for the entire night that you are sleeping, at least 4 to 6 hours  Remember:  . Do not drive or operate heavy machinery if tired or drowsy.  . Please notify the supply company and office if you are unable to use your device regularly due to missing supplies or machine being broken.  . Work on maintaining a healthy weight and following your recommended nutrition plan  . Maintain proper daily exercise and movement  . Maintaining proper use of your device can also help improve management of other chronic illnesses such as: Blood pressure, blood sugars, and weight management.   BiPAP/ CPAP Cleaning:  >>>Clean weekly, with Dawn soap, and bottle brush.  Set up to air dry. >>> Wipe mask out daily with wet wipe or towelette    Follow Up:    Return in about 6 months (around 09/05/2020), or if symptoms worsen or fail to improve, for [facility].   Please do your part to reduce the spread of COVID-19:      Reduce your risk of any infection  and COVID19 by using the similar precautions used for avoiding the common cold or flu:  [name] Wash your hands often with soap and warm water for at least 20 seconds.  If soap and water are not readily available, use an alcohol-based hand sanitizer with at least 60% alcohol.  . If coughing or sneezing, cover your mouth and nose by coughing or sneezing into the elbow areas of your shirt or coat, into a tissue or into your sleeve (not your hands). Langley Gauss A MASK when in public  . Avoid shaking hands with others and consider head nods or verbal greetings only. . Avoid touching  your eyes, nose, or mouth with unwashed hands.  . Avoid close contact with people who are sick. . Avoid places or events with large numbers of people in one location, like concerts or sporting events. . If you have some symptoms but not all symptoms, continue to monitor at home and seek medical attention if your symptoms worsen. . If you are having a medical emergency, call 911.   [place] / e-Visit: [email or web address]         [facility]: [facility]: [phone]                   [facility]: [phone]     It is flu season:   >>> Best ways to protect herself from the flu: Receive the yearly flu vaccine, practice good hand hygiene washing with soap and also using hand sanitizer when available, eat a nutritious meals, get adequate rest, hydrate appropriately   Please contact the office if your symptoms worsen or you have concerns that you are not improving.   Thank you for choosing [facility] for your healthcare, and for allowing [place] to partner with you on your healthcare journey. I am thankful to be able to provide care to you today.  Wyn Quaker FNP-C

## 2020-03-05 NOTE — Assessment & Plan Note (Signed)
Reviewed home sleep study with patient showed severe obstructive sleep apnea   Reviewed pathophysiology of obstructive sleep apnea  Reviewed treatment of CPAP Emphasized need for CPAP compliance Reviewed CPAP compliance with patient  Plan: Change CPAP settings to APAP 5-15 Offered referral back to DME company for mask fitting, patient declined Encourage patient to increase humidified air Continue CPAP nightly, goal should be using it every night for greater than 4 hours We will tentatively plan on seeing patient back in 6 months sooner if she is having any issues

## 2020-03-05 NOTE — Progress Notes (Signed)
@Patient  ID: Vickie Kemp, female    DOB: October 05, 1940, 79 y.o.   MRN: [id number]  Chief Complaint  Patient presents with  . Follow-up    shortness of breath with exertion    Referring provider: McLean-Scocuzza, Olivia Mackie *  HPI:  79 year old female never smoker referred to our office for obstructive sleep apnea management on 11/21/2019  PMH: Allergic rhinitis Smoker/ Smoking History: Never Smoker  Maintenance: None    Pt of: Dr. Halford Chessman   03/05/2020  - Visit   79 year old female never smoker followed in our office for obstructive sleep apnea.  At last office visit patient was seen in April/2021 which was her sleep consult home sleep study was performed on 01/23/2020 and showed severe obstructive sleep apnea with an AHI of 40.8, SaO2 low 77%.  Patient was started on CPAP therapy.  Patient CPAP compliance report is listed below:  02/04/2020-03/04/2020-CPAP compliance report-20 had a last 30 days used, 15 of those days greater than 4 hours, average usage 4 hours and 25 minutes, APAP setting 5-20, 95th percentile 9.4, AHI 6  Patient reports adequate CPAP use.  She is having some mouth dryness.  She is using humidified air.  She reports increased anxiety with using CPAP but she feels that when she gets more used to it than this will be more comfortable for her.  She would like to discuss her sleep study results.  Patient admits that sometimes she struggles with remembering to put the CPAP on.  Questionaires / Pulmonary Flowsheets:   ACT:  No flowsheet data found.  MMRC: No flowsheet data found.  Epworth:  Results of the Epworth flowsheet 11/21/2019  Sitting and reading 2  Watching TV 3  Sitting, inactive in a public place (e.g. a theatre or a meeting) 0  As a passenger in a car for an hour without a break 0  Lying down to rest in the afternoon when circumstances permit 3  Sitting and talking to someone 0  Sitting quietly after a lunch without alcohol 0  In a car, while stopped for a few  minutes in traffic 0  Total score 8    Tests:   FENO:  No results found for: NITRICOXIDE  PFT: No flowsheet data found.  WALK:  No flowsheet data found.  Imaging: No results found.  Lab Results:  CBC    Component Value Date/Time   WBC 8.0 01/15/2020 1023   RBC 3.71 (L) 01/15/2020 1023   HGB 10.9 (L) 01/15/2020 1023   HCT 32.8 (L) 01/15/2020 1023   PLT 305.0 01/15/2020 1023   MCV 88.3 01/15/2020 1023   MCHC 33.1 01/15/2020 1023   RDW 14.4 01/15/2020 1023   LYMPHSABS 2.4 01/15/2020 1023   MONOABS 0.7 01/15/2020 1023   EOSABS 0.2 01/15/2020 1023   BASOSABS 0.0 01/15/2020 1023    BMET    Component Value Date/Time   NA 140 01/15/2020 1023   K 3.9 01/15/2020 1023   CL 103 01/15/2020 1023   CO2 30 01/15/2020 1023   GLUCOSE 150 (H) 01/15/2020 1023   BUN 23 01/15/2020 1023   CREATININE 1.53 (H) 01/15/2020 1023   CALCIUM 9.6 01/15/2020 1023    BNP No results found for: BNP  ProBNP No results found for: PROBNP  Specialty Problems      Pulmonary Problems   Chronic maxillary sinusitis   Allergic rhinitis   OSA on CPAP    Managed leb pulm Wyn Quaker np/Dr. sood  Allergies  Allergen Reactions  . Amoxicillin Itching    Immunization History  Administered Date(s) Administered  . Fluad Quad(high Dose 65+) 04/28/2019  . Influenza, High Dose Seasonal PF 06/04/2017, 06/07/2018  . PFIZER SARS-COV-2 Vaccination 10/08/2019, 10/29/2019  . Pneumococcal Polysaccharide-23 05/05/2013    Past Medical History:  Diagnosis Date  . Chronic kidney disease   . Coronary artery disease    MI, pci to mid LAD 2014. LHC 80%OM, 60%RCA, 90% LAD  . Diabetes mellitus without complication ([place])   . Hypertension   . Sleep apnea     Tobacco History: Social History   Tobacco Use  Smoking Status Never Smoker  Smokeless Tobacco Never Used   Counseling given: Not Answered  Continue to not smoke  Outpatient Encounter Medications as of 03/05/2020  Medication  Sig  . acetaminophen (TYLENOL) 650 MG CR tablet Take 650 mg by mouth 3 (three) times daily as needed.  [name] amLODipine (NORVASC) 10 MG tablet Take 1 tablet (10 mg total) by mouth daily.  [name] aspirin EC 81 MG tablet Take 81 mg by mouth daily.  [name] atorvastatin (LIPITOR) 40 MG tablet Take 1 tablet (40 mg total) by mouth daily.  . furosemide (LASIX) 40 MG tablet Take 40 mg by mouth.  . Glucose Blood (BLOOD GLUCOSE TEST STRIPS) STRP E11.9 check sugar tid for designated machine  . insulin detemir (LEVEMIR FLEXTOUCH) 100 UNIT/ML FlexPen 30 units in am and 30 units qhs with food  . insulin lispro (HUMALOG KWIKPEN) 100 UNIT/ML KwikPen Prn based on sugar If sugar 70-130 0 units If sugar 131-180 4 units If sugar 181-240 8 units If sugar 241-300 10 units If sugar 301-350 12 units If sugar 351-400 16 units If >400 20 units and call doctor  . Insulin Pen Needle (PEN NEEDLES) 31G X 8 MM MISC 1 pen by Does not apply route 2 (two) times daily as needed.  . Insulin Pen Needle 32G X 4 MM MISC 1 Device by Does not apply route 4 (four) times daily - after meals and at bedtime.  [name] labetalol (NORMODYNE) 300 MG tablet Take 2 tablets (600 mg total) by mouth 2 (two) times daily.  [name] latanoprost (XALATAN) 0.005 % ophthalmic solution Place 1 drop into the left eye at bedtime.  . Loperamide HCl (IMODIUM PO) Take by mouth.  . sodium chloride (OCEAN) 0.65 % SOLN nasal spray Place 2 sprays into Kemp nostrils daily as needed for congestion.  . Turmeric (QC TUMERIC COMPLEX PO) Take by mouth.   [name] MAGNESIUM SULFATE PO Take 500 mg by mouth. (Patient not taking: Reported on 03/05/2020)   No facility-administered encounter medications on file as of 03/05/2020.     Review of Systems  Review of Systems  Constitutional: Negative for activity change, fatigue and fever.  HENT: Negative for sinus pressure, sinus pain and sore throat.   Respiratory: Positive for cough and shortness of breath. Negative for wheezing.   Cardiovascular: Negative  for chest pain and palpitations.  Gastrointestinal: Negative for diarrhea, nausea and vomiting.  Musculoskeletal: Negative for arthralgias.  Neurological: Negative for dizziness.  Psychiatric/Behavioral: Negative for sleep disturbance. The patient is not nervous/anxious.      Physical Exam  BP (!) 146/78 (BP Location: Left Arm, Cuff Size: Normal)   Pulse 65   Temp 97.8 F (36.6 C) (Oral)   Ht 5\' 5"  (1.651 m)   Wt 192 lb 12.8 oz (87.5 kg)   SpO2 99% Comment: Room air  BMI 32.08 kg/m   Wt Readings  from Last 5 Encounters:  03/05/20 192 lb 12.8 oz (87.5 kg)  02/12/20 193 lb 9.6 oz (87.8 kg)  12/26/19 190 lb (86.2 kg)  11/25/19 184 lb (83.5 kg)  11/21/19 184 lb 3.2 oz (83.6 kg)    BMI Readings from Last 5 Encounters:  03/05/20 32.08 kg/m  02/12/20 32.22 kg/m  12/26/19 31.62 kg/m  11/25/19 30.62 kg/m  11/21/19 30.65 kg/m     Physical Exam Vitals and nursing note reviewed.  Constitutional:      General: She is not in acute distress.    Appearance: Normal appearance.  HENT:     Head: Normocephalic and atraumatic.     Right Ear: Tympanic membrane and external ear normal.     Left Ear: External ear normal.     Nose:     Comments: Deferred due to masking requirement    Mouth/Throat:     Comments: Deferred due to masking requirement Eyes:     Pupils: Pupils are equal, round, and reactive to light.  Cardiovascular:     Rate and Rhythm: Normal rate and regular rhythm.     Pulses: Normal pulses.     Heart sounds: Normal heart sounds. No murmur heard.   Pulmonary:     Breath sounds: Normal breath sounds. No decreased air movement. No decreased breath sounds, wheezing or rales.  Musculoskeletal:     Cervical back: Normal range of motion.  Skin:    General: Skin is warm and dry.     Capillary Refill: Capillary refill takes less than 2 seconds.  Neurological:     General: No focal deficit present.     Mental Status: She is alert and oriented to person, place, and  time. Mental status is at baseline.     Gait: Gait normal.  Psychiatric:        Mood and Affect: Mood normal.        Behavior: Behavior normal.        Thought Content: Thought content normal.        Judgment: Judgment normal.       Assessment & Plan:   OSA on CPAP Reviewed home sleep study with patient showed severe obstructive sleep apnea   Reviewed pathophysiology of obstructive sleep apnea  Reviewed treatment of CPAP Emphasized need for CPAP compliance Reviewed CPAP compliance with patient  Plan: Change CPAP settings to APAP 5-15 Offered referral back to DME company for mask fitting, patient declined Encourage patient to increase humidified air Continue CPAP nightly, goal should be using it every night for greater than 4 hours We will tentatively plan on seeing patient back in 6 months sooner if she is having any issues    Return in about 6 months (around 09/05/2020), or if symptoms worsen or fail to improve, for [facility].   Lauraine Rinne, NP 03/05/2020   This appointment required 22 minutes of patient care (this includes precharting, chart review, review of results, face-to-face care, etc.).

## 2020-03-08 ENCOUNTER — Encounter: Payer: Self-pay | Admitting: Internal Medicine

## 2020-03-08 NOTE — Progress Notes (Signed)
Reviewed and agree with assessment/plan.   Chesley Mires, MD [facility] Pulmonary/Critical Care 03/08/2020, 7:23 AM Pager:  [phone]

## 2020-03-09 ENCOUNTER — Telehealth: Payer: Self-pay | Admitting: Internal Medicine

## 2020-03-09 NOTE — Telephone Encounter (Signed)
I would rec consider vascular surgery consult ? Pt agree?

## 2020-03-10 NOTE — Telephone Encounter (Signed)
McLean-Scocuzza, Nino Glow, MD Yesterday (8:41 AM)  TM   I would rec consider vascular surgery consult ? Pt agree?

## 2020-03-10 NOTE — Telephone Encounter (Signed)
Added to previous Patient message encounter  °

## 2020-03-11 DIAGNOSIS — G4733 Obstructive sleep apnea (adult) (pediatric): Secondary | ICD-10-CM | POA: Diagnosis not present

## 2020-03-18 ENCOUNTER — Ambulatory Visit (INDEPENDENT_AMBULATORY_CARE_PROVIDER_SITE_OTHER): Payer: Medicare Other | Admitting: Cardiology

## 2020-03-18 ENCOUNTER — Encounter: Payer: Self-pay | Admitting: Cardiology

## 2020-03-18 ENCOUNTER — Other Ambulatory Visit: Payer: Self-pay

## 2020-03-18 VITALS — BP 120/60 | Ht 64.0 in | Wt 196.0 lb

## 2020-03-18 DIAGNOSIS — R600 Localized edema: Secondary | ICD-10-CM

## 2020-03-18 DIAGNOSIS — I10 Essential (primary) hypertension: Secondary | ICD-10-CM | POA: Diagnosis not present

## 2020-03-18 DIAGNOSIS — I2510 Atherosclerotic heart disease of native coronary artery without angina pectoris: Secondary | ICD-10-CM | POA: Diagnosis not present

## 2020-03-18 DIAGNOSIS — I340 Nonrheumatic mitral (valve) insufficiency: Secondary | ICD-10-CM | POA: Diagnosis not present

## 2020-03-18 MED ORDER — AMLODIPINE BESYLATE 5 MG PO TABS: 5.0000 mg | ORAL_TABLET | Freq: Every day | ORAL | 3 refills | Status: DC

## 2020-03-18 MED ORDER — FUROSEMIDE 40 MG PO TABS: 40.0000 mg | ORAL_TABLET | Freq: Two times a day (BID) | ORAL | 3 refills | Status: DC

## 2020-03-18 MED ORDER — CARVEDILOL 25 MG PO TABS: 25.0000 mg | ORAL_TABLET | Freq: Two times a day (BID) | ORAL | 3 refills | Status: DC

## 2020-03-18 NOTE — Patient Instructions (Signed)
Medication Instructions:   Your physician has recommended you make the following change in your medication:   1.  STOP taking your . 2.  START taking carvedilol (COREG) 25 MG tablet: Take 1 tablet (25 mg total) by mouth 2 (two) times daily. 3.  DECREASE your amLODipine (NORVASC)  to 5 MG tablet: Take 1 tablet (5 mg total) by mouth daily. 4.  INCREASE your furosemide (LASIX) to 40 MG tablet: Take 1 tablet (40 mg total) by mouth in the morning and at bedtime.  *If you need a refill on your cardiac medications before your next appointment, please call your pharmacy*   Lab Work:  Your physician recommends that you return for lab work in: 5-6 days. - Please go to the [facility]. You will check in at the front desk to the right as you walk into the atrium. Valet Parking is offered if needed. - No appointment needed. You may go any day between 7 am and 6 pm. If you have labs (blood work) drawn today and your tests are completely normal, you will receive your results only by: [name] MyChart Message (if you have MyChart) OR . A paper copy in the mail If you have any lab test that is abnormal or we need to change your treatment, we will call you to review the results.   Testing/Procedures:  Your physician has requested that you have an echocardiogram. Echocardiography is a painless test that uses sound waves to create images of your heart. It provides your doctor with information about the size and shape of your heart and how well your heart's chambers and valves are working. This procedure takes approximately one hour. There are no restrictions for this procedure.    Follow-Up: At [facility], you and your health needs are our priority.  As part of our continuing mission to provide you with exceptional heart care, we have created designated Provider Care Teams.  These Care Teams include your primary Cardiologist (physician) and Advanced Practice Providers (APPs -  Physician Assistants and  Nurse Practitioners) who all work together to provide you with the care you need, when you need it.  We recommend signing up for the patient portal called "MyChart".  Sign up information is provided on this After Visit Summary.  MyChart is used to connect with patients for Virtual Visits (Telemedicine).  Patients are able to view lab/test results, encounter notes, upcoming appointments, etc.  Non-urgent messages can be sent to your provider as well.   To learn more about what you can do with MyChart, go to [email or web address].    Your next appointment:   7-10 day(s)  The format for your next appointment:   In Person  Provider:   Kate Sable, MD   Other Instructions

## 2020-03-18 NOTE — Progress Notes (Signed)
Cardiology Office Note:    Date:  03/18/2020   ID:  Vickie Kemp, DOB 1941/06/17, MRN [id number]  PCP:  McLean-Scocuzza, Nino Glow, MD  Cardiologist:  Kate Sable, MD  Electrophysiologist:  None   Referring MD: McLean-Scocuzza, Olivia Mackie *   Chief Complaint  Patient presents with  . Other    Patient c/o swelling in ankles and legs. Meds reviewed verbally with patient.     History of Present Illness:    Vickie Kemp is a 79 y.o. female with a hx of hypertension, CKD, OSA, obesity, CAD/MI (status post DES to mLAD 04/2013 at [facility] [place]).  She presents today for worsening lower extremity edema.  She states her legs have been swelling up over the past 2 weeks.  Initially, symptoms will be normal in the morning and progressively gets worse during the day.  Over the past several days, she has persistently swelling in her legs causing difficulty bending her knees.  She has chronic shortness of breath which has been about the same.  Follows up with pulmonary medicine for OSA.  Denies chest pain.  Prior notes Echocardiogram on 06/2019 showed normal systolic function with EF 60 to 27%, grade 1 diastolic dysfunction, mild to moderate MR. Per records, angiogram in 2014 showed 90% mid LAD lesion, 80% OM, 60% RCA s/p DES to mid LAD 2014.    She still states feeling fatigued during the day, short of breath with walking.  Walking causes her to be short of breath, needing to take frequent breaks in her walks.  She denied symptoms of chest pain.  She was advised to be compliant with CPAP as this may worsen symptoms of shortness of breath. patient is still not compliant with CPAP machine.  States her CPAP machine is old, she has not cleaned it and also bulky/cumbersome.    Past Medical History:  Diagnosis Date  . Chronic kidney disease   . Coronary artery disease    MI, pci to mid LAD 2014. LHC 80%OM, 60%RCA, 90% LAD  . Diabetes mellitus without complication  ([place])   . Hypertension   . Sleep apnea     Past Surgical History:  Procedure Laterality Date  . ACNE CYST REMOVAL  2008   Groin area  . CARDIAC CATHETERIZATION    . CORONARY ANGIOPLASTY      Current Medications: Current Meds  Medication Sig  . acetaminophen (TYLENOL) 650 MG CR tablet Take 650 mg by mouth 3 (three) times daily as needed.  [name] amLODipine (NORVASC) 5 MG tablet Take 1 tablet (5 mg total) by mouth daily.  [name] aspirin EC 81 MG tablet Take 81 mg by mouth daily.  [name] atorvastatin (LIPITOR) 40 MG tablet Take 1 tablet (40 mg total) by mouth daily.  . furosemide (LASIX) 40 MG tablet Take 1 tablet (40 mg total) by mouth in the morning and at bedtime.  . Glucose Blood (BLOOD GLUCOSE TEST STRIPS) STRP E11.9 check sugar tid for designated machine  . insulin detemir (LEVEMIR FLEXTOUCH) 100 UNIT/ML FlexPen 30 units in am and 30 units qhs with food  . insulin lispro (HUMALOG KWIKPEN) 100 UNIT/ML KwikPen Prn based on sugar If sugar 70-130 0 units If sugar 131-180 4 units If sugar 181-240 8 units If sugar 241-300 10 units If sugar 301-350 12 units If sugar 351-400 16 units If >400 20 units and call doctor  . Insulin Pen Needle (PEN NEEDLES) 31G X 8 MM MISC 1 pen by Does not apply route  2 (two) times daily as needed.  . Insulin Pen Needle 32G X 4 MM MISC 1 Device by Does not apply route 4 (four) times daily - after meals and at bedtime.  [name] latanoprost (XALATAN) 0.005 % ophthalmic solution Place 1 drop into the left eye at bedtime.  . Loperamide HCl (IMODIUM PO) Take by mouth.  [name] MAGNESIUM SULFATE PO Take 500 mg by mouth.   . sodium chloride (OCEAN) 0.65 % SOLN nasal spray Place 2 sprays into both nostrils daily as needed for congestion.  . Turmeric (QC TUMERIC COMPLEX PO) Take by mouth.   . [DISCONTINUED] amLODipine (NORVASC) 10 MG tablet Take 1 tablet (10 mg total) by mouth daily.  . [DISCONTINUED] furosemide (LASIX) 40 MG tablet Take 40 mg by mouth.  . [DISCONTINUED] labetalol (NORMODYNE)  300 MG tablet Take 2 tablets (600 mg total) by mouth 2 (two) times daily.     Allergies:   Amoxicillin   Social History   Socioeconomic History  . Marital status: Single    Spouse name: Not on file  . Number of children: Not on file  . Years of education: Not on file  . Highest education level: Not on file  Occupational History  . Not on file  Tobacco Use  . Smoking status: Never Smoker  . Smokeless tobacco: Never Used  Vaping Use  . Vaping Use: Never used  Substance and Sexual Activity  . Alcohol use: Never  . Drug use: Never  . Sexual activity: Not on file  Other Topics Concern  . Not on file  Social History Narrative   Used to live in [place] Determinants of Health   Financial Resource Strain:   . Difficulty of Paying Living Expenses:   Food Insecurity:   . Worried About Charity fundraiser in the Last Year:   . Arboriculturist in the Last Year:   Transportation Needs:   . Film/video editor (Medical):   [name] Lack of Transportation (Non-Medical):   Physical Activity:   . Days of Exercise per Week:   . Minutes of Exercise per Session:   Stress:   . Feeling of Stress :   Social Connections:   . Frequency of Communication with Friends and Family:   . Frequency of Social Gatherings with Friends and Family:   . Attends Religious Services:   . Active Member of Clubs or Organizations:   . Attends Archivist Meetings:   [name] Marital Status:      Family History: The patient's family history includes Diabetes in her mother; Heart disease in her father.  ROS:   Please see the history of present illness.     All other systems reviewed and are negative.  EKGs/Labs/Other Studies Reviewed:    The following studies were reviewed today: TTE 07-02-19 1. Left ventricular ejection fraction, by visual estimation, is 60 to 65%. The left ventricle has normal function. There is no left ventricular hypertrophy.  2. Left ventricular  diastolic parameters are consistent with Grade I diastolic dysfunction (impaired relaxation).  3. Global right ventricle has normal systolic function.The right ventricular size is normal. No increase in right ventricular wall thickness.  4. Left atrial size was mildly dilated.  5. Mild to moderate mitral valve regurgitation.  6. Tricuspid valve regurgitation mild-moderate.  7. The pulmonic valve was normal in structure. Pulmonic valve regurgitation is not visualized.  8. Moderately elevated pulmonary artery systolic pressure.  9. The tricuspid regurgitant velocity is 3.28 m/s, and with an assumed right atrial pressure of 10 mmHg, the estimated right ventricular systolic pressure is moderately elevated at 53.0 mmHg.  EKG:  EKG not  ordered today..   Recent Labs: 04/28/2019: TSH 1.15 01/15/2020: ALT 9; BUN 23; Creatinine, Ser 1.53; Hemoglobin 10.9; Platelets 305.0; Potassium 3.9; Sodium 140  Recent Lipid Panel    Component Value Date/Time   CHOL 83 10/15/2019 0913   TRIG 72.0 10/15/2019 0913   HDL 25.50 (L) 10/15/2019 0913   CHOLHDL 3 10/15/2019 0913   VLDL 14.4 10/15/2019 0913   LDLCALC 43 10/15/2019 0913    Physical Exam:    VS:  BP 120/60 (BP Location: Right Arm, Patient Position: Sitting, Cuff Size: Normal)   Ht 5\' 4"  (1.626 m)   Wt 196 lb (88.9 kg)   BMI 33.64 kg/m     Wt Readings from Last 3 Encounters:  03/18/20 196 lb (88.9 kg)  03/05/20 192 lb 12.8 oz (87.5 kg)  02/12/20 193 lb 9.6 oz (87.8 kg)     GEN:  Well nourished, well developed in no acute distress HEENT: Normal NECK: No JVD; No carotid bruits LYMPHATICS: No lymphadenopathy CARDIAC: RRR, no murmurs, rubs, gallops RESPIRATORY:  Clear to auscultation without rales, wheezing or rhonchi  ABDOMEN: Soft, non-tender, non-distended MUSCULOSKELETAL: 2+ pitting edema; No deformity  SKIN: Warm and dry NEUROLOGIC:  Alert and oriented x 3 PSYCHIATRIC:  Normal affect   ASSESSMENT:    1. Edema leg   2. Coronary  artery disease involving native coronary artery of native heart without angina pectoris   3. Hypertension, unspecified type   4. Mitral valve insufficiency, unspecified etiology   5. Essential hypertension    PLAN:    1. Patient with worsening lower extremity edema over the past 2 weeks.  Pitting edema noted on my exam.  Repeat echocardiogram to evaluate any LV or valvular dysfunction.  Increase Lasix to 40 mg twice daily.  Check BMP in 5days.  Decrease Norvasc to 5 mg daily in case this is contributing. 2. History of CAD status post PCI to mid LAD.  aspirin 81 mg, Lipitor 40 mg. 3. hx of hypertension, blood pressure controlled.  Stop labetalol, start Coreg 25 mg twice daily.  Decrease Norvasc to 5 mg daily as above.  Continue Lasix. 4. Mild to moderate MR noted on last echocardiogram on 06/2019.  Repeat echo possible due to edema.   Follow-up in 7-10days  Total encounter time 40 minutes  Greater than 50% was spent in counseling and coordination of care with the patient   Medication Adjustments/Labs and Tests Ordered: Current medicines are reviewed at length with the patient today.  Concerns regarding medicines are outlined above.  Orders Placed This Encounter  Procedures  . Basic Metabolic Panel (BMET)  . ECHOCARDIOGRAM COMPLETE   Meds ordered this encounter  Medications  . amLODipine (NORVASC) 5 MG tablet    Sig: Take 1 tablet (5 mg total) by mouth daily.    Dispense:  30 tablet    Refill:  3  . furosemide (LASIX) 40 MG tablet    Sig: Take 1 tablet (40 mg total) by mouth in the morning and at bedtime.    Dispense:  60 tablet    Refill:  3  . carvedilol (COREG) 25 MG tablet    Sig: Take 1 tablet (25 mg total) by mouth 2 (two) times daily.    Dispense:  60 tablet    Refill:  3  Patient Instructions  Medication Instructions:   Your physician has recommended you make the following change in your medication:   1.  STOP taking your . 2.  START taking carvedilol (COREG)  25 MG tablet: Take 1 tablet (25 mg total) by mouth 2 (two) times daily. 3.  DECREASE your amLODipine (NORVASC)  to 5 MG tablet: Take 1 tablet (5 mg total) by mouth daily. 4.  INCREASE your furosemide (LASIX) to 40 MG tablet: Take 1 tablet (40 mg total) by mouth in the morning and at bedtime.  *If you need a refill on your cardiac medications before your next appointment, please call your pharmacy*   Lab Work:  Your physician recommends that you return for lab work in: 5-6 days. - Please go to the [facility]. You will check in at the front desk to the right as you walk into the atrium. Valet Parking is offered if needed. - No appointment needed. You may go any day between 7 am and 6 pm. If you have labs (blood work) drawn today and your tests are completely normal, you will receive your results only by: [name] MyChart Message (if you have MyChart) OR . A paper copy in the mail If you have any lab test that is abnormal or we need to change your treatment, we will call you to review the results.   Testing/Procedures:  Your physician has requested that you have an echocardiogram. Echocardiography is a painless test that uses sound waves to create images of your heart. It provides your doctor with information about the size and shape of your heart and how well your heart's chambers and valves are working. This procedure takes approximately one hour. There are no restrictions for this procedure.    Follow-Up: At [facility], you and your health needs are our priority.  As part of our continuing mission to provide you with exceptional heart care, we have created designated Provider Care Teams.  These Care Teams include your primary Cardiologist (physician) and Advanced Practice Providers (APPs -  Physician Assistants and Nurse Practitioners) who all work together to provide you with the care you need, when you need it.  We recommend signing up for the patient portal called "MyChart".  Sign  up information is provided on this After Visit Summary.  MyChart is used to connect with patients for Virtual Visits (Telemedicine).  Patients are able to view lab/test results, encounter notes, upcoming appointments, etc.  Non-urgent messages can be sent to your provider as well.   To learn more about what you can do with MyChart, go to [email or web address].    Your next appointment:   7-10 day(s)  The format for your next appointment:   In Person  Provider:   Kate Sable, MD   Other Instructions      Signed, Kate Sable, MD  03/18/2020 12:09 PM    [place]

## 2020-03-19 ENCOUNTER — Ambulatory Visit (INDEPENDENT_AMBULATORY_CARE_PROVIDER_SITE_OTHER): Payer: Medicare Other

## 2020-03-19 ENCOUNTER — Other Ambulatory Visit
Admission: RE | Admit: 2020-03-19 | Discharge: 2020-03-19 | Disposition: A | Discharge status: Home / Self Care | Payer: Medicare Other | Attending: Cardiology | Admitting: Cardiology

## 2020-03-19 DIAGNOSIS — I340 Nonrheumatic mitral (valve) insufficiency: Secondary | ICD-10-CM | POA: Insufficient documentation

## 2020-03-19 DIAGNOSIS — I10 Essential (primary) hypertension: Secondary | ICD-10-CM

## 2020-03-19 DIAGNOSIS — I2510 Atherosclerotic heart disease of native coronary artery without angina pectoris: Secondary | ICD-10-CM

## 2020-03-19 DIAGNOSIS — R600 Localized edema: Secondary | ICD-10-CM | POA: Diagnosis not present

## 2020-03-19 LAB — BASIC METABOLIC PANEL
Anion gap: 13 (ref 5–15)
BUN: 20 mg/dL (ref 8–23)
CO2: 28 mmol/L (ref 22–32)
Calcium: 9.3 mg/dL (ref 8.9–10.3)
Chloride: 101 mmol/L (ref 98–111)
Creatinine, Ser: 1.49 mg/dL — ABNORMAL HIGH (ref 0.44–1.00)
GFR calc Af Amer: 39 mL/min — ABNORMAL LOW (ref >60)
GFR calc non Af Amer: 33 mL/min — ABNORMAL LOW (ref >60)
Glucose, Bld: 109 mg/dL — ABNORMAL HIGH (ref 70–99)
Potassium: 3.8 mmol/L (ref 3.5–5.1)
Sodium: 142 mmol/L (ref 135–145)

## 2020-03-19 LAB — ECHOCARDIOGRAM COMPLETE
AR max vel: 1.89 cm2
AV Area VTI: 1.82 cm2
AV Area mean vel: 1.8 cm2
AV Mean grad: 4 mmHg
AV Peak grad: 9.6 mmHg
Ao pk vel: 1.55 m/s
Area-P 1/2: 3.63 cm2
S' Lateral: 2.7 cm
Single Plane A4C EF: 52.1 %

## 2020-03-22 ENCOUNTER — Other Ambulatory Visit: Payer: Self-pay

## 2020-03-22 DIAGNOSIS — I10 Essential (primary) hypertension: Secondary | ICD-10-CM

## 2020-03-23 ENCOUNTER — Encounter: Payer: Self-pay | Admitting: Cardiology

## 2020-03-23 ENCOUNTER — Other Ambulatory Visit: Payer: Self-pay

## 2020-03-23 ENCOUNTER — Ambulatory Visit: Payer: Medicare Other | Admitting: Cardiology

## 2020-03-23 VITALS — BP 142/70 | HR 66 | Ht 64.0 in | Wt 194.0 lb

## 2020-03-23 DIAGNOSIS — I2510 Atherosclerotic heart disease of native coronary artery without angina pectoris: Secondary | ICD-10-CM | POA: Diagnosis not present

## 2020-03-23 DIAGNOSIS — I272 Pulmonary hypertension, unspecified: Secondary | ICD-10-CM

## 2020-03-23 DIAGNOSIS — I5189 Other ill-defined heart diseases: Secondary | ICD-10-CM

## 2020-03-23 DIAGNOSIS — I10 Essential (primary) hypertension: Secondary | ICD-10-CM

## 2020-03-23 NOTE — Telephone Encounter (Signed)
Aaron Edelman, please advise.  Any additional recommendations?

## 2020-03-23 NOTE — Patient Instructions (Signed)
Medication Instructions:   Your physician recommends that you continue on your current medications as directed. Please refer to the Current Medication list given to you today.  *If you need a refill on your cardiac medications before your next appointment, please call your pharmacy*   Lab Work: None Ordered If you have labs (blood work) drawn today and your tests are completely normal, you will receive your results only by: [name] MyChart Message (if you have MyChart) OR . A paper copy in the mail If you have any lab test that is abnormal or we need to change your treatment, we will call you to review the results.   Testing/Procedures: None Ordered   Follow-Up: At [facility], you and your health needs are our priority.  As part of our continuing mission to provide you with exceptional heart care, we have created designated Provider Care Teams.  These Care Teams include your primary Cardiologist (physician) and Advanced Practice Providers (APPs -  Physician Assistants and Nurse Practitioners) who all work together to provide you with the care you need, when you need it.  We recommend signing up for the patient portal called "MyChart".  Sign up information is provided on this After Visit Summary.  MyChart is used to connect with patients for Virtual Visits (Telemedicine).  Patients are able to view lab/test results, encounter notes, upcoming appointments, etc.  Non-urgent messages can be sent to your provider as well.   To learn more about what you can do with MyChart, go to [email or web address].    Your next appointment:   1 month(s)  The format for your next appointment:   In Person  Provider:   Kate Sable, MD   Other Instructions

## 2020-03-23 NOTE — Progress Notes (Signed)
Cardiology Office Note:    Date:  03/23/2020   ID:  [name], DOB [birth year], MRN [id number]  PCP:  McLean-Scocuzza, Nino Glow, MD  Cardiologist:  Kate Sable, MD  Electrophysiologist:  None   Referring MD: McLean-Scocuzza, Olivia Mackie *   Chief Complaint  Patient presents with  . OTher    7-10 day follow up. patient c.o SOB with walking. Meds reviewed verbally with patient.     History of Present Illness:    Vickie Kemp is a 79 y.o. female with a hx of hypertension, CKD, OSA, obesity, CAD/MI (status post DES to mLAD 04/2013 at [facility] [place] [facility]).  Who presents for follow-up.  Last seen for worsening lower extremity edema.  Lasix was increased to 40 mg twice daily.  Echocardiogram was also ordered. Patient states edema has improved. She has some burning in her legs which she associates with neuropathy and follows up with primary care physician for neuropathy. She sees pulmonary medicine for OSA and is compliant with cpap since last month. Has a follow up appointment with pulmonary medicine next month.  Prior notes Echocardiogram on 06/2019 showed normal systolic function with EF 60 to 50%, grade 1 diastolic dysfunction, mild to moderate MR. Per records, angiogram in 2014 showed 90% mid LAD lesion, 80% OM, 60% RCA s/p DES to mid LAD 2014.     Past Medical History:  Diagnosis Date  . Chronic kidney disease   . Coronary artery disease    MI, pci to mid LAD 2014. LHC 80%OM, 60%RCA, 90% LAD  . Diabetes mellitus without complication ([place])   . Hypertension   . Sleep apnea     Past Surgical History:  Procedure Laterality Date  . ACNE CYST REMOVAL  2008   Groin area  . CARDIAC CATHETERIZATION    . CORONARY ANGIOPLASTY      Current Medications: Current Meds  Medication Sig  . acetaminophen (TYLENOL) 650 MG CR tablet Take 650 mg by mouth 3 (three) times daily as needed.  [name] amLODipine (NORVASC) 5 MG tablet Take 1 tablet (5 mg total) by mouth  daily.  [name] aspirin EC 81 MG tablet Take 81 mg by mouth daily.  [name] atorvastatin (LIPITOR) 40 MG tablet Take 1 tablet (40 mg total) by mouth daily.  . carvedilol (COREG) 25 MG tablet Take 1 tablet (25 mg total) by mouth 2 (two) times daily.  . furosemide (LASIX) 40 MG tablet Take 1 tablet (40 mg total) by mouth in the morning and at bedtime.  . Glucose Blood (BLOOD GLUCOSE TEST STRIPS) STRP E11.9 check sugar tid for designated machine  . insulin detemir (LEVEMIR FLEXTOUCH) 100 UNIT/ML FlexPen 30 units in am and 30 units qhs with food  . insulin lispro (HUMALOG KWIKPEN) 100 UNIT/ML KwikPen Prn based on sugar If sugar 70-130 0 units If sugar 131-180 4 units If sugar 181-240 8 units If sugar 241-300 10 units If sugar 301-350 12 units If sugar 351-400 16 units If >400 20 units and call doctor  . Insulin Pen Needle (PEN NEEDLES) 31G X 8 MM MISC 1 pen by Does not apply route 2 (two) times daily as needed.  . Insulin Pen Needle 32G X 4 MM MISC 1 Device by Does not apply route 4 (four) times daily - after meals and at bedtime.  [name] latanoprost (XALATAN) 0.005 % ophthalmic solution Place 1 drop into the left eye at bedtime.  . Loperamide HCl (IMODIUM PO) Take by mouth.  [name] MAGNESIUM SULFATE PO  Take 500 mg by mouth.   . sodium chloride (OCEAN) 0.65 % SOLN nasal spray Place 2 sprays into both nostrils daily as needed for congestion.  . Turmeric (QC TUMERIC COMPLEX PO) Take by mouth.      Allergies:   Amoxicillin   Social History   Socioeconomic History  . Marital status: Single    Spouse name: Not on file  . Number of children: Not on file  . Years of education: Not on file  . Highest education level: Not on file  Occupational History  . Not on file  Tobacco Use  . Smoking status: Never Smoker  . Smokeless tobacco: Never Used  Vaping Use  . Vaping Use: Never used  Substance and Sexual Activity  . Alcohol use: Never  . Drug use: Never  . Sexual activity: Not on file  Other Topics Concern  . Not  on file  Social History Narrative   Used to live in [place] Determinants of Health   Financial Resource Strain:   . Difficulty of Paying Living Expenses:   Food Insecurity:   . Worried About Charity fundraiser in the Last Year:   . Arboriculturist in the Last Year:   Transportation Needs:   . Film/video editor (Medical):   [name] Lack of Transportation (Non-Medical):   Physical Activity:   . Days of Exercise per Week:   . Minutes of Exercise per Session:   Stress:   . Feeling of Stress :   Social Connections:   . Frequency of Communication with Friends and Family:   . Frequency of Social Gatherings with Friends and Family:   . Attends Religious Services:   . Active Member of Clubs or Organizations:   . Attends Archivist Meetings:   [name] Marital Status:      Family History: The patient's family history includes Diabetes in her mother; Heart disease in her father.  ROS:   Please see the history of present illness.     All other systems reviewed and are negative.  EKGs/Labs/Other Studies Reviewed:    The following studies were reviewed today: TTE 2019-06-26 1. Left ventricular ejection fraction, by visual estimation, is 60 to 65%. The left ventricle has normal function. There is no left ventricular hypertrophy.  2. Left ventricular diastolic parameters are consistent with Grade I diastolic dysfunction (impaired relaxation).  3. Global right ventricle has normal systolic function.The right ventricular size is normal. No increase in right ventricular wall thickness.  4. Left atrial size was mildly dilated.  5. Mild to moderate mitral valve regurgitation.  6. Tricuspid valve regurgitation mild-moderate.  7. The pulmonic valve was normal in structure. Pulmonic valve regurgitation is not visualized.  8. Moderately elevated pulmonary artery systolic pressure.  9. The tricuspid regurgitant velocity is 3.28 m/s, and with an assumed right atrial  pressure of 10 mmHg, the estimated right ventricular systolic pressure is moderately elevated at 53.0 mmHg.  EKG:  EKG not  ordered today..   Recent Labs: 04/28/2019: TSH 1.15 01/15/2020: ALT 9; Hemoglobin 10.9; Platelets 305.0 03/19/2020: BUN 20; Creatinine, Ser 1.49; Potassium 3.8; Sodium 142  Recent Lipid Panel    Component Value Date/Time   CHOL 83 10/15/2019 0913   TRIG 72.0 10/15/2019 0913   HDL 25.50 (L) 10/15/2019 0913   CHOLHDL 3 10/15/2019 0913   VLDL 14.4 10/15/2019 0913   LDLCALC 43 10/15/2019 0913    Physical Exam:  VS:  BP (!) 142/70 (BP Location: Left Arm, Patient Position: Sitting, Cuff Size: Normal)   Pulse 66   Ht 5\' 4"  (1.626 m)   Wt 194 lb (88 kg)   SpO2 94%   BMI 33.30 kg/m     Wt Readings from Last 3 Encounters:  03/23/20 194 lb (88 kg)  03/18/20 196 lb (88.9 kg)  03/05/20 192 lb 12.8 oz (87.5 kg)     GEN:  Well nourished, well developed in no acute distress HEENT: Normal NECK: No JVD; No carotid bruits LYMPHATICS: No lymphadenopathy CARDIAC: RRR, no murmurs, rubs, gallops RESPIRATORY:  Clear to auscultation without rales, wheezing or rhonchi  ABDOMEN: Soft, non-tender, non-distended MUSCULOSKELETAL: 1-2+ pitting edema; No deformity  SKIN: Warm and dry NEUROLOGIC:  Alert and oriented x 3 PSYCHIATRIC:  Normal affect   ASSESSMENT:    1. Diastolic dysfunction   2. Pulmonary HTN ([place])   3. Coronary artery disease involving native coronary artery of native heart without angina pectoris   4. Hypertension, unspecified type    PLAN:    1. Patient with improved lower extremity edema, 2lbs weight loss over the past week. Repeat echocardiogram showed normal systolic function, grade 2 diastolic dysfunction.  cont Lasix to 40 mg twice daily. 2. Echocardiogram showed severe pulmonary hypertension with PASP of 40mmHg.  Etiology of pulmonary hypertension likely multifactorial in light of OSA via hypoxia pathway or diastolic dysfunction (LVDD-PH). Cont cpap  for osa tx, lasix as above. Patient not currently interested in [place] to evaluate possiblilty of [place]. Sees pulm next month for OSA f/u. Recommends she seeks input regarding PH. 3. History of CAD status post PCI to mid LAD.  aspirin 81 mg, Lipitor 40 mg.  LDL is at goal 4. hx of hypertension. Cont coreg, norvasc, lasix as above.  Follow-up in 1 month  Total encounter time 40 minutes  Greater than 50% was spent in counseling and coordination of care with the patient   Medication Adjustments/Labs and Tests Ordered: Current medicines are reviewed at length with the patient today.  Concerns regarding medicines are outlined above.  No orders of the defined types were placed in this encounter.  No orders of the defined types were placed in this encounter.   Patient Instructions  Medication Instructions:   Your physician recommends that you continue on your current medications as directed. Please refer to the Current Medication list given to you today.  *If you need a refill on your cardiac medications before your next appointment, please call your pharmacy*   Lab Work: None Ordered If you have labs (blood work) drawn today and your tests are completely normal, you will receive your results only by: [name] MyChart Message (if you have MyChart) OR . A paper copy in the mail If you have any lab test that is abnormal or we need to change your treatment, we will call you to review the results.   Testing/Procedures: None Ordered   Follow-Up: At [facility], you and your health needs are our priority.  As part of our continuing mission to provide you with exceptional heart care, we have created designated Provider Care Teams.  These Care Teams include your primary Cardiologist (physician) and Advanced Practice Providers (APPs -  Physician Assistants and Nurse Practitioners) who all work together to provide you with the care you need, when you need it.  We recommend signing up for the patient  portal called "MyChart".  Sign up information is provided on this After Visit Summary.  MyChart is used to connect with patients for Virtual Visits (Telemedicine).  Patients are able to view lab/test results, encounter notes, upcoming appointments, etc.  Non-urgent messages can be sent to your provider as well.   To learn more about what you can do with MyChart, go to [email or web address].    Your next appointment:   1 month(s)  The format for your next appointment:   In Person  Provider:   Kate Sable, MD   Other Instructions        Signed, Kate Sable, MD  03/23/2020 10:40 AM    [place]

## 2020-03-24 NOTE — Telephone Encounter (Signed)
03/24/2020  It looks like patient had the echocardiogram completed by cardiology.  I would recommend that she follow-up with cardiology for additional recommendations.  Most likely patient's elevated pulmonary pressures are directly related to group 2 and group 3 pulmonary hypertension which are related to pulmonary/chronic lung disease/obstructive sleep apnea and cardiovascular disease.  From our perspective she needs to make sure she is taking her diuretics, weighing herself regularly, and using her CPAP regularly.  She needs also keep follow-up with cardiology.  Wyn Quaker, FNP

## 2020-03-29 ENCOUNTER — Ambulatory Visit: Payer: Medicare Other | Admitting: Physician Assistant

## 2020-03-31 DIAGNOSIS — N2581 Secondary hyperparathyroidism of renal origin: Secondary | ICD-10-CM | POA: Diagnosis not present

## 2020-03-31 DIAGNOSIS — I129 Hypertensive chronic kidney disease with stage 1 through stage 4 chronic kidney disease, or unspecified chronic kidney disease: Secondary | ICD-10-CM | POA: Diagnosis not present

## 2020-03-31 DIAGNOSIS — N1832 Chronic kidney disease, stage 3b: Secondary | ICD-10-CM | POA: Diagnosis not present

## 2020-03-31 DIAGNOSIS — E1122 Type 2 diabetes mellitus with diabetic chronic kidney disease: Secondary | ICD-10-CM | POA: Diagnosis not present

## 2020-04-05 ENCOUNTER — Encounter: Payer: Self-pay | Admitting: Podiatry

## 2020-04-05 ENCOUNTER — Encounter: Payer: Self-pay | Admitting: Internal Medicine

## 2020-04-11 DIAGNOSIS — G4733 Obstructive sleep apnea (adult) (pediatric): Secondary | ICD-10-CM | POA: Diagnosis not present

## 2020-04-20 DIAGNOSIS — E113293 Type 2 diabetes mellitus with mild nonproliferative diabetic retinopathy without macular edema, bilateral: Secondary | ICD-10-CM | POA: Diagnosis not present

## 2020-04-20 LAB — HM DIABETES EYE EXAM

## 2020-04-24 ENCOUNTER — Other Ambulatory Visit: Payer: Self-pay

## 2020-04-24 ENCOUNTER — Emergency Department: Payer: Medicare Other

## 2020-04-24 ENCOUNTER — Emergency Department
Admission: EM | Admit: 2020-04-24 | Discharge: 2020-04-24 | Disposition: A | Discharge status: Home / Self Care | Payer: Medicare Other | Attending: Emergency Medicine | Admitting: Emergency Medicine

## 2020-04-24 DIAGNOSIS — R0602 Shortness of breath: Secondary | ICD-10-CM

## 2020-04-24 DIAGNOSIS — I129 Hypertensive chronic kidney disease with stage 1 through stage 4 chronic kidney disease, or unspecified chronic kidney disease: Secondary | ICD-10-CM | POA: Diagnosis not present

## 2020-04-24 DIAGNOSIS — Z7982 Long term (current) use of aspirin: Secondary | ICD-10-CM | POA: Diagnosis not present

## 2020-04-24 DIAGNOSIS — Z9861 Coronary angioplasty status: Secondary | ICD-10-CM | POA: Diagnosis not present

## 2020-04-24 DIAGNOSIS — I10 Essential (primary) hypertension: Secondary | ICD-10-CM | POA: Diagnosis not present

## 2020-04-24 DIAGNOSIS — M79662 Pain in left lower leg: Secondary | ICD-10-CM | POA: Insufficient documentation

## 2020-04-24 DIAGNOSIS — I2510 Atherosclerotic heart disease of native coronary artery without angina pectoris: Secondary | ICD-10-CM | POA: Diagnosis not present

## 2020-04-24 DIAGNOSIS — N184 Chronic kidney disease, stage 4 (severe): Secondary | ICD-10-CM | POA: Insufficient documentation

## 2020-04-24 DIAGNOSIS — Z794 Long term (current) use of insulin: Secondary | ICD-10-CM | POA: Diagnosis not present

## 2020-04-24 DIAGNOSIS — E1122 Type 2 diabetes mellitus with diabetic chronic kidney disease: Secondary | ICD-10-CM | POA: Diagnosis not present

## 2020-04-24 DIAGNOSIS — J90 Pleural effusion, not elsewhere classified: Secondary | ICD-10-CM | POA: Diagnosis not present

## 2020-04-24 DIAGNOSIS — E1159 Type 2 diabetes mellitus with other circulatory complications: Secondary | ICD-10-CM | POA: Insufficient documentation

## 2020-04-24 DIAGNOSIS — E785 Hyperlipidemia, unspecified: Secondary | ICD-10-CM | POA: Insufficient documentation

## 2020-04-24 DIAGNOSIS — R609 Edema, unspecified: Secondary | ICD-10-CM | POA: Diagnosis not present

## 2020-04-24 DIAGNOSIS — M79604 Pain in right leg: Secondary | ICD-10-CM | POA: Diagnosis not present

## 2020-04-24 DIAGNOSIS — Z79899 Other long term (current) drug therapy: Secondary | ICD-10-CM | POA: Insufficient documentation

## 2020-04-24 DIAGNOSIS — M79661 Pain in right lower leg: Secondary | ICD-10-CM | POA: Diagnosis present

## 2020-04-24 DIAGNOSIS — R600 Localized edema: Secondary | ICD-10-CM | POA: Diagnosis not present

## 2020-04-24 LAB — BASIC METABOLIC PANEL
Anion gap: 11 (ref 5–15)
BUN: 22 mg/dL (ref 8–23)
CO2: 28 mmol/L (ref 22–32)
Calcium: 9.1 mg/dL (ref 8.9–10.3)
Chloride: 104 mmol/L (ref 98–111)
Creatinine, Ser: 1.55 mg/dL — ABNORMAL HIGH (ref 0.44–1.00)
GFR calc Af Amer: 37 mL/min — ABNORMAL LOW (ref >60)
GFR calc non Af Amer: 32 mL/min — ABNORMAL LOW (ref >60)
Glucose, Bld: 119 mg/dL — ABNORMAL HIGH (ref 70–99)
Potassium: 3.2 mmol/L — ABNORMAL LOW (ref 3.5–5.1)
Sodium: 143 mmol/L (ref 135–145)

## 2020-04-24 LAB — MAGNESIUM: Magnesium: 1.7 mg/dL (ref 1.7–2.4)

## 2020-04-24 LAB — CBC
HCT: 36.9 % (ref 36.0–46.0)
Hemoglobin: 12 g/dL (ref 12.0–15.0)
MCH: 28.6 pg (ref 26.0–34.0)
MCHC: 32.5 g/dL (ref 30.0–36.0)
MCV: 87.9 fL (ref 80.0–100.0)
Platelets: 200 10*3/uL (ref 150–400)
RBC: 4.2 MIL/uL (ref 3.87–5.11)
RDW: 13.2 % (ref 11.5–15.5)
WBC: 12.3 10*3/uL — ABNORMAL HIGH (ref 4.0–10.5)
nRBC: 0 % (ref 0.0–0.2)

## 2020-04-24 LAB — TROPONIN I (HIGH SENSITIVITY): Troponin I (High Sensitivity): 16 ng/L (ref <18)

## 2020-04-24 LAB — BRAIN NATRIURETIC PEPTIDE: B Natriuretic Peptide: 79 pg/mL (ref 0.0–100.0)

## 2020-04-24 IMAGING — CR DG CHEST 2V
1 series · 2 of 2 positions shown · non-contrast
Comparison: None.

CLINICAL DATA: Shortness of breath with exertion.

EXAM:
CHEST - 2 VIEW

[Series 1: dg chest 2 view · 0.14mm/px · 2 of 2 slices shown]
[im 1/2]
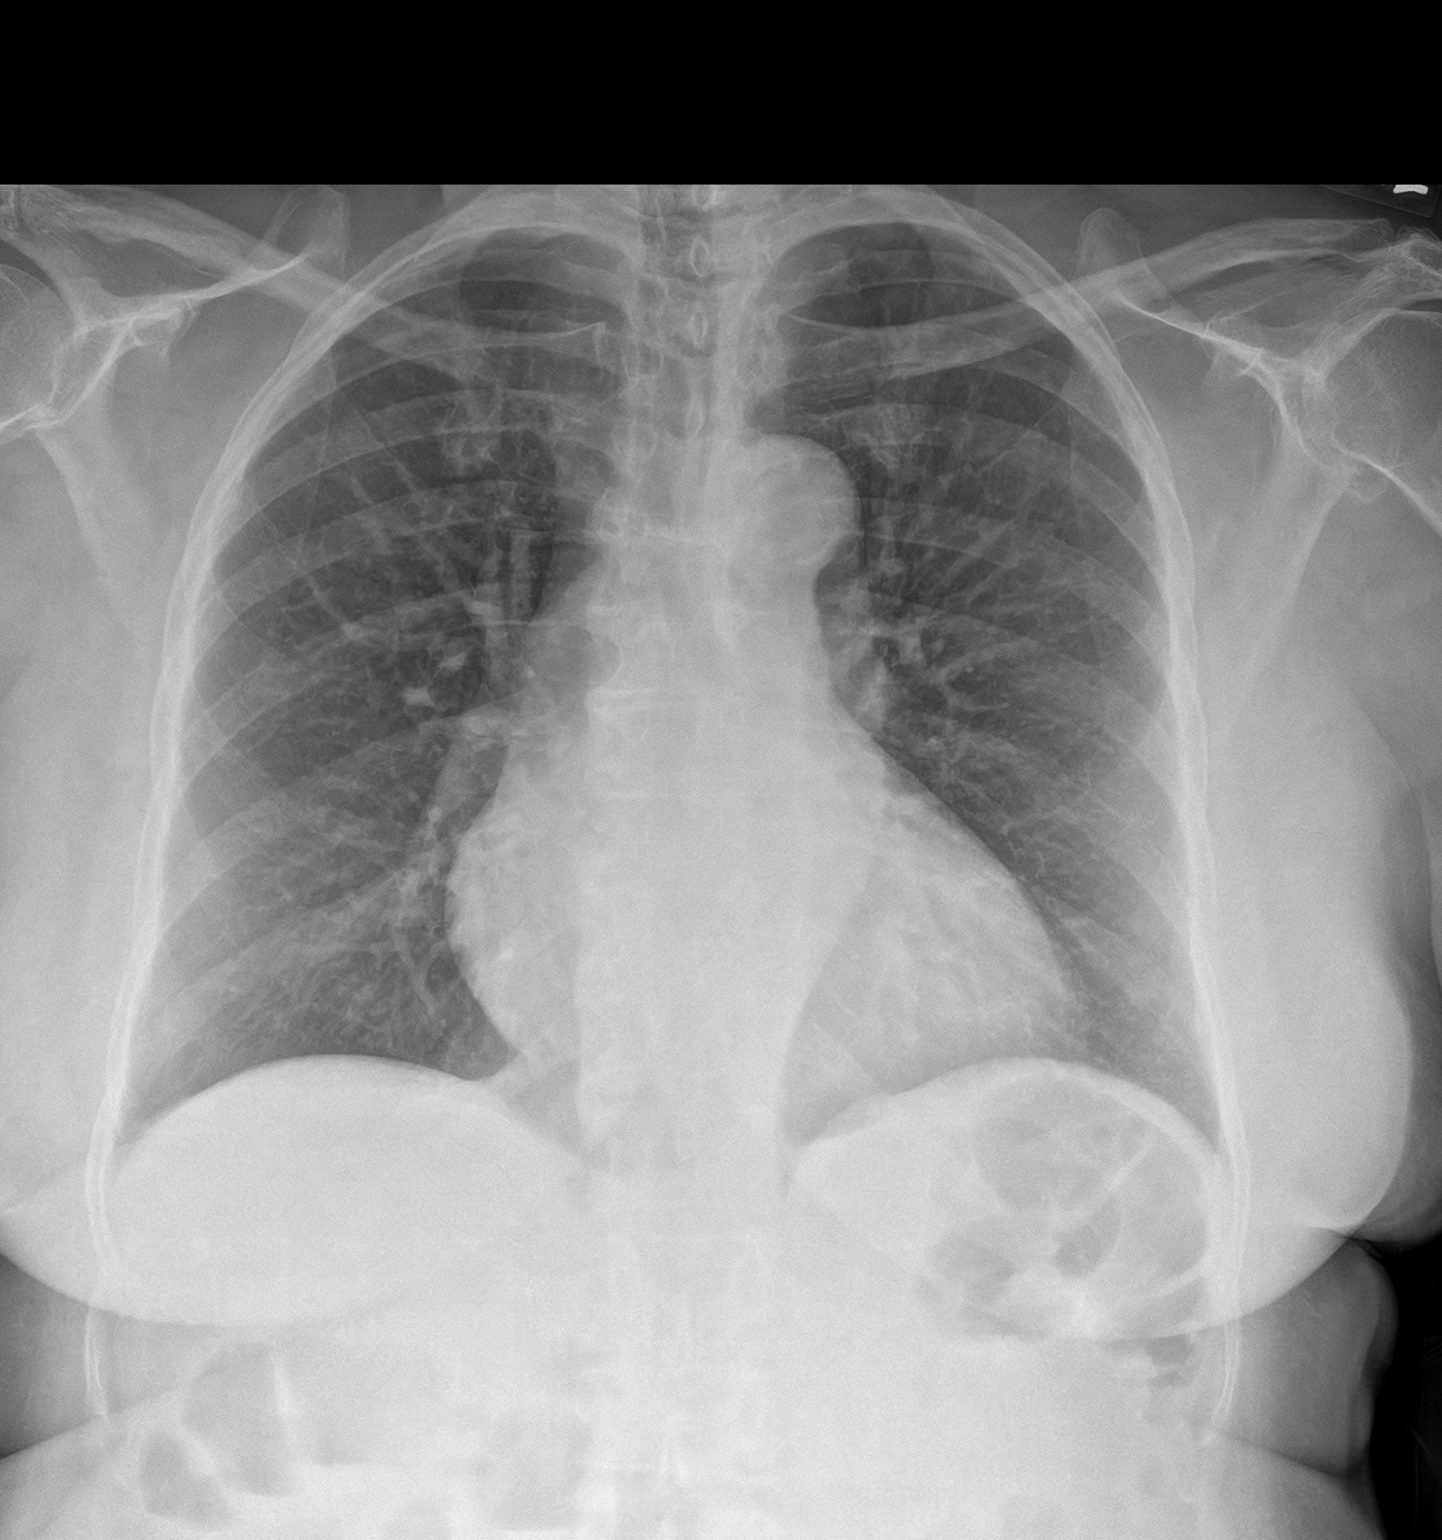
[im 2/2]
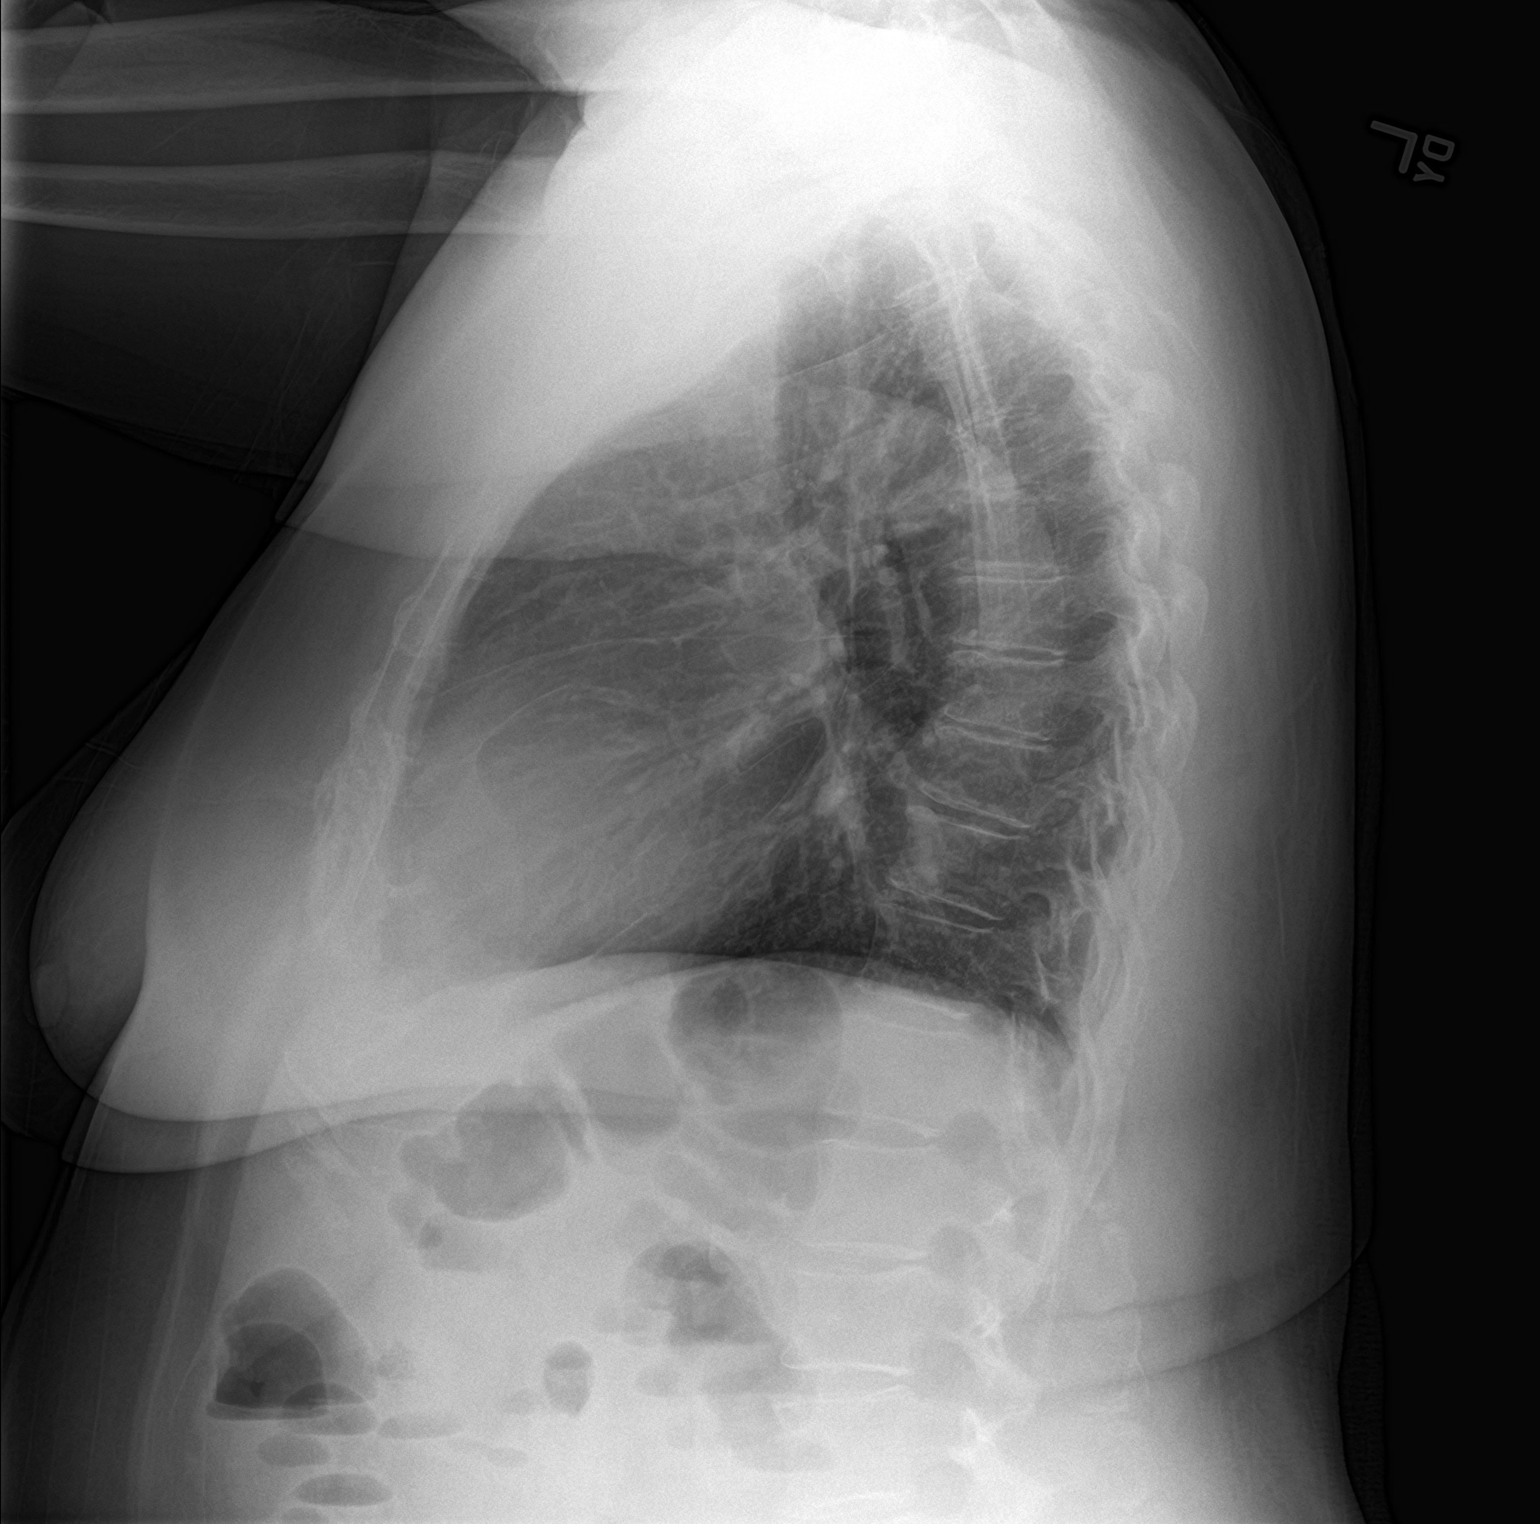

[2 of 2 positions shown; findings below may reference images not displayed]

FINDINGS: The cardiac silhouette, mediastinal and hilar contours are within
normal limits for age. There is mild tortuosity and calcification of
the thoracic aorta. The lungs are clear of an acute process. No
worrisome pulmonary lesions or pleural effusions. The bony thorax is
intact.
IMPRESSION: No acute cardiopulmonary findings.

## 2020-04-24 IMAGING — US US EXTREM LOW VENOUS
1 series · 13 of 24 positions shown · non-contrast
Comparison: None.

CLINICAL DATA: 79-year-old presenting with acute BILATERAL lower
extremity pain and edema.



[Series 1: us venous img lower bilat (dvt) · portal-venous · 13 of 62 slices shown]
[im 1/62]
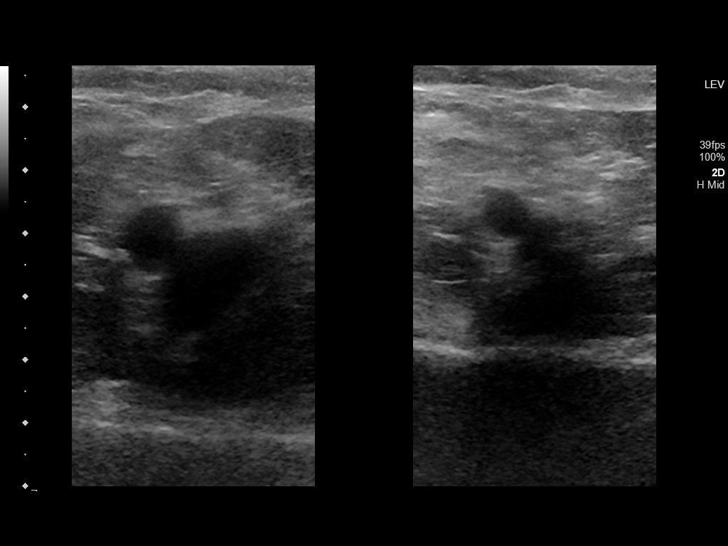
[im 6/62]
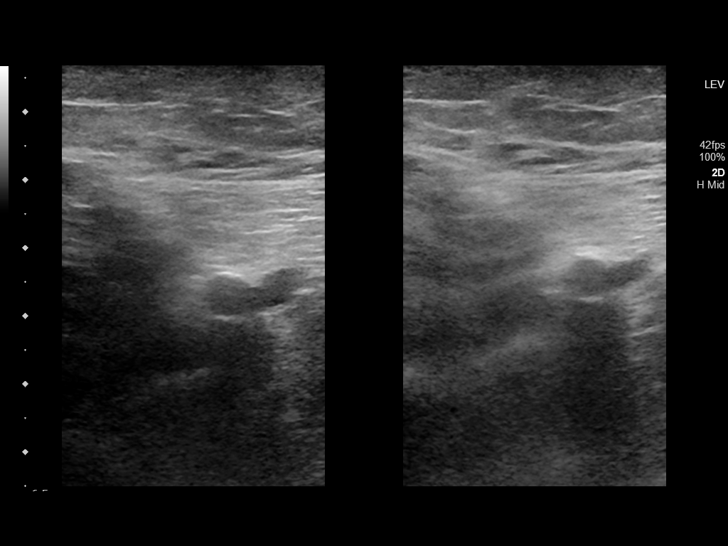
[im 11/62]
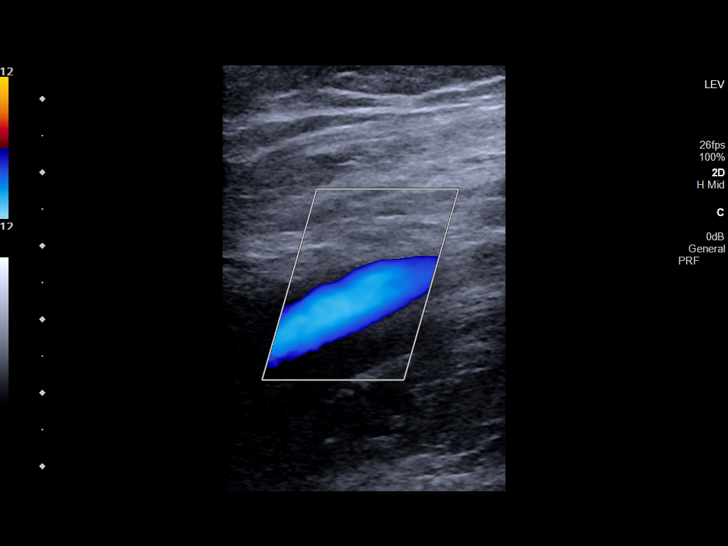
[im 16/62]
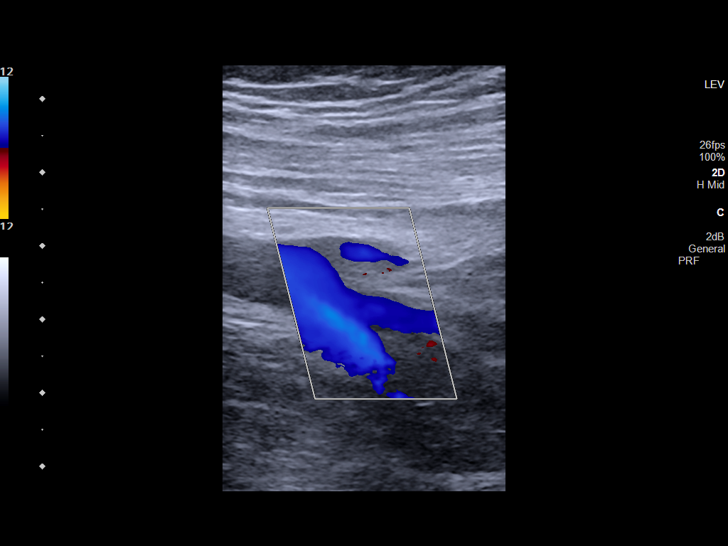
[im 22/62]
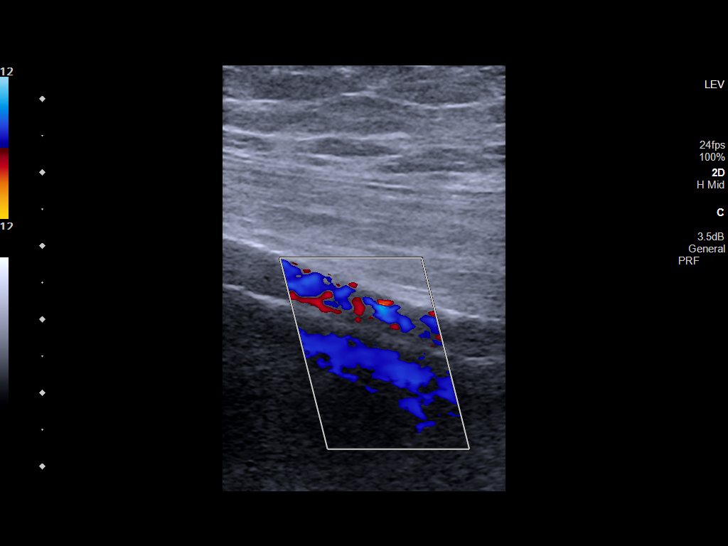
[im 27/62]
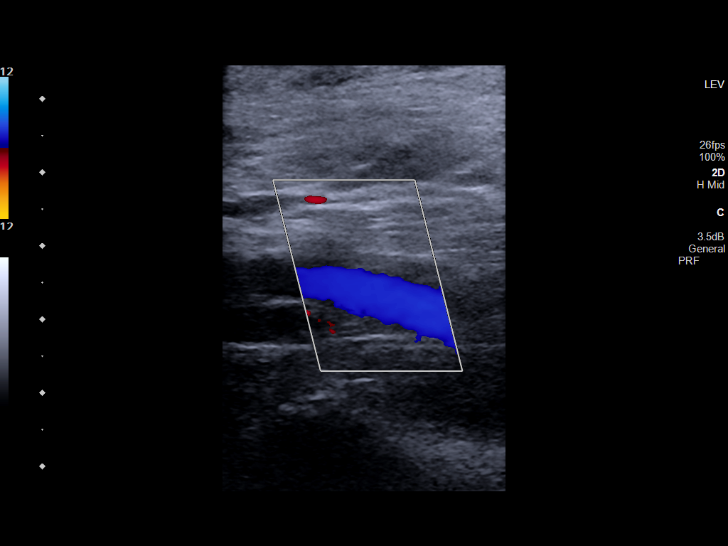
[im 32/62]
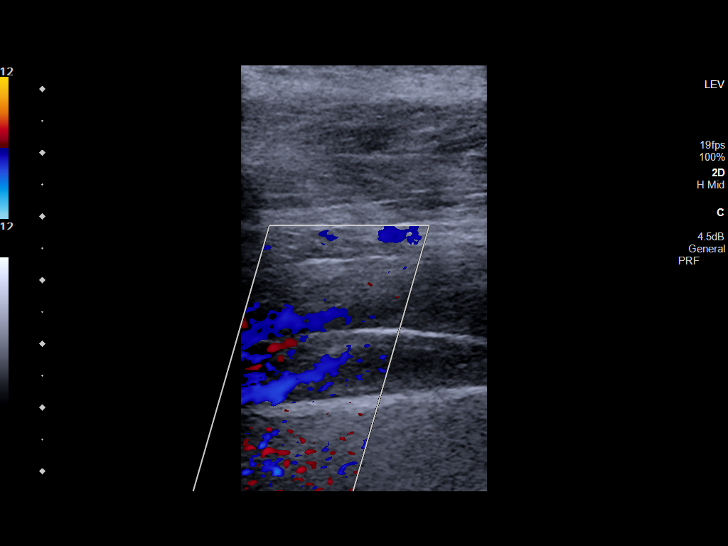
[im 35/62]
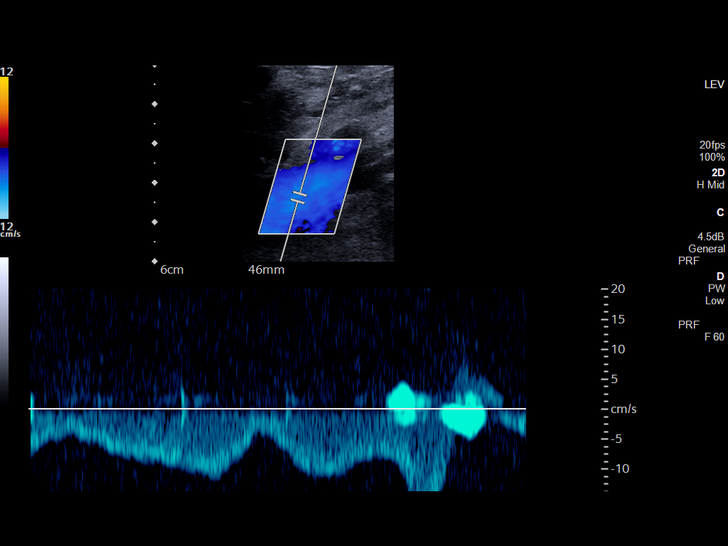
[im 40/62]
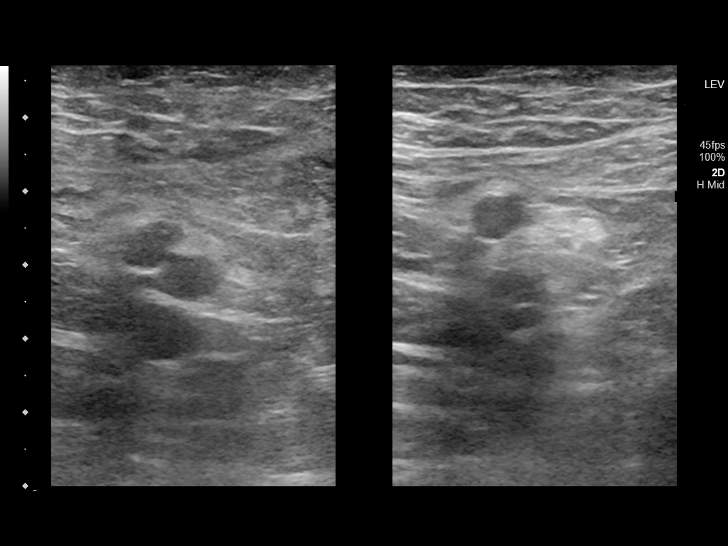
[im 46/62]
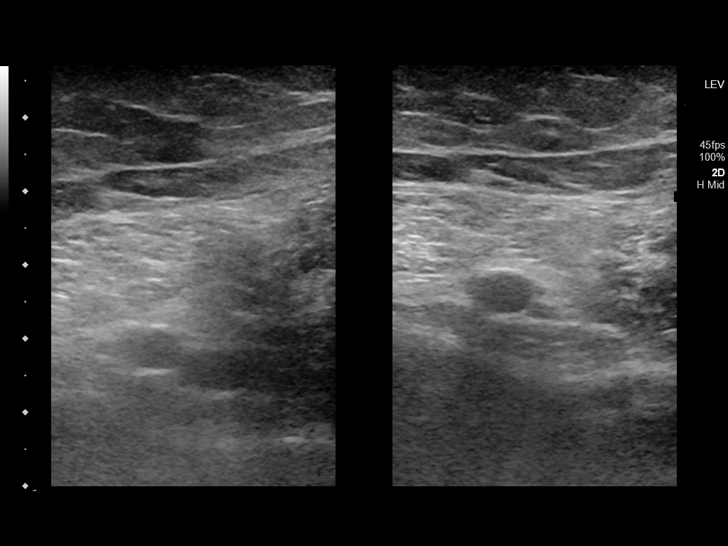
[im 51/62]
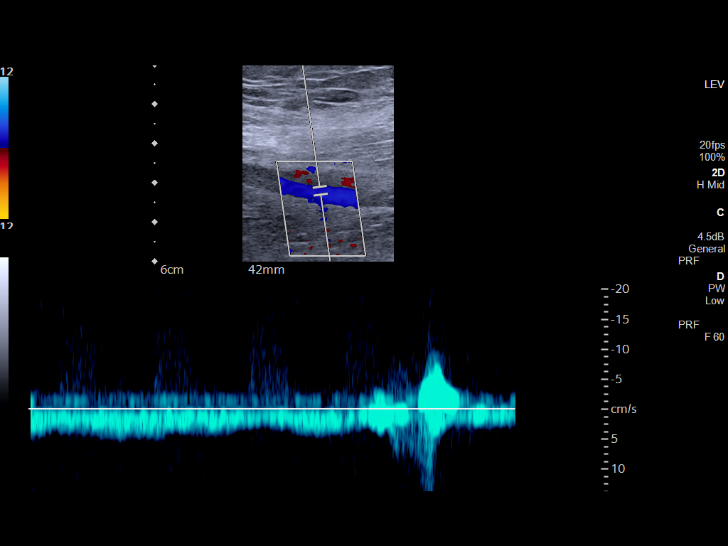
[im 56/62]
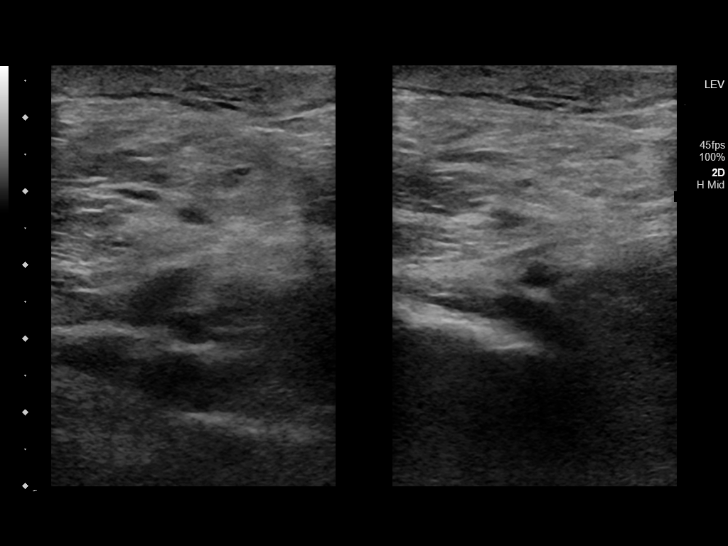
[im 62/62]
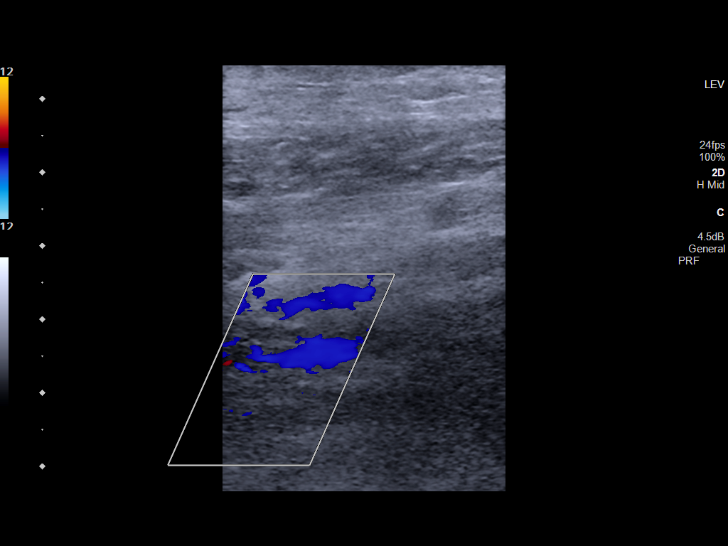

[13 of 24 positions shown; findings below may reference images not displayed]

FINDINGS: RIGHT LOWER EXTREMITY

Common Femoral Vein: No evidence of thrombus. Normal
compressibility, respiratory phasicity and response to augmentation.

Saphenofemoral Junction: No evidence of thrombus. Normal
compressibility and flow on color Doppler imaging.

Profunda Femoral Vein: No evidence of thrombus. Normal
compressibility and flow on color Doppler imaging.

Femoral Vein: No evidence of thrombus. Normal compressibility,
respiratory phasicity and response to augmentation.

Popliteal Vein: No evidence of thrombus. Normal compressibility,
respiratory phasicity and response to augmentation.

Calf Veins: No evidence of thrombus. Normal compressibility and flow
on color Doppler imaging.

Superficial Great Saphenous Vein: Not evaluated except at the
saphenofemoral junction.

Venous Reflux:  Not evaluated.

Other Findings:  Calf edema.

LEFT LOWER EXTREMITY

Common Femoral Vein: No evidence of thrombus. Normal
compressibility, respiratory phasicity and response to augmentation.

Saphenofemoral Junction: No evidence of thrombus. Normal
compressibility and flow on color Doppler imaging.

Profunda Femoral Vein: No evidence of thrombus. Normal
compressibility and flow on color Doppler imaging.

Femoral Vein: No evidence of thrombus. Normal compressibility,
respiratory phasicity and response to augmentation.

Popliteal Vein: No evidence of thrombus. Normal compressibility,
respiratory phasicity and response to augmentation.

Calf Veins: No evidence of thrombus. Normal compressibility and flow
on color Doppler imaging.

Superficial Great Saphenous Vein: Not evaluated except at the
saphenofemoral junction.

Venous Reflux:  Not evaluated.

Other Findings:  Calf edema.
IMPRESSION: No evidence of DVT involving either lower extremity.

## 2020-04-24 NOTE — ED Triage Notes (Signed)
Pt states that she is having bilat lower leg pain that started around 03/15/20, states that she has been having swelling in her legs and reports each were turning colors and painful, denies hx of chf

## 2020-04-24 NOTE — Discharge Instructions (Signed)
Your ultrasounds today do not show a blood clot.  Go to a medical supply store with the prescription for compression stockings. Wear them most all the time.  Follow up with the vascular specialist. Call Monday to request an appointment.  Follow up with your primary care provider as well.  Limit your sodium intake, but increase your potassium intake. Your magnesium level is ok today.  Return to the ER if you begin to feel short of breath or for other concerns if unable to see your primary care provider.

## 2020-04-24 NOTE — ED Provider Notes (Signed)
[facility] Emergency Department Provider Note ____________________________________________   First MD Initiated Contact with Patient 04/24/20 1234     (approximate)  I have reviewed the triage vital signs and the nursing notes.   HISTORY  Chief Complaint Leg Pain and Leg Swelling  HPI Vickie Kemp is a 79 y.o. female with history of CKD, CAD, DM, and venous stasis presents to the emergency department for treatment and evaluation of bilateral lower extremity pain and swelling. Symptoms have been progressively worsening for the past month. Legs seem more swollen and painful in the mornings. She has been using a walker to help her get around. No increase in shortness of breath or orthopnea. No chest pain.         Past Medical History:  Diagnosis Date   Chronic kidney disease    Coronary artery disease    MI, pci to mid LAD 2014. LHC 80%OM, 60%RCA, 90% LAD   Diabetes mellitus without complication ([place])    Hypertension    Sleep apnea     Patient Active Problem List   Diagnosis Date Noted   OSA on CPAP 02/29/2020   Hypertension associated with diabetes ([place]) 01/20/2020   Memory loss 01/20/2020   Tremor 01/20/2020   Essential hypertension 01/20/2020   Left lumbar radiculopathy 12/26/2019   At risk for obstructive sleep apnea 11/21/2019   Allergic rhinitis 11/21/2019   Cystitis 10/17/2019   PVD (peripheral vascular disease) ([place]) 10/02/2019   Stage 3b chronic kidney disease 09/30/2019   Lumbar radiculopathy 09/30/2019   Cervicalgia 09/30/2019   CKD (chronic kidney disease) stage 4, GFR 15-29 ml/min (HCC) 09/30/2019   Arthritis 09/30/2019   Chronic maxillary sinusitis 09/30/2019   Venous stasis 08/28/2019   Pain due to onychomycosis of toenails of both feet 05/01/2019   Type 2 diabetes mellitus with vascular disease ([place]) 05/01/2019   Chronic arthropathy 05/01/2019   Obesity 01/21/2018   CAD (coronary artery  disease) 06/04/2017   Hyperlipemia 06/04/2017   Insulin dependent diabetes mellitus 06/04/2017   S/P PTCA (percutaneous transluminal coronary angioplasty) 06/04/2017   Palpitations 06/04/2017   Essential (primary) hypertension 06/04/2015    Past Surgical History:  Procedure Laterality Date   ACNE CYST REMOVAL  2008   Groin area   CARDIAC CATHETERIZATION     CORONARY ANGIOPLASTY      Prior to Admission medications   Medication Sig Start Date End Date Taking? Authorizing Provider  acetaminophen (TYLENOL) 650 MG CR tablet Take 650 mg by mouth 3 (three) times daily as needed.    [provider]  amLODipine (NORVASC) 5 MG tablet Take 1 tablet (5 mg total) by mouth daily. 03/18/20   Kate Sable, MD  aspirin EC 81 MG tablet Take 81 mg by mouth daily.    [provider]  atorvastatin (LIPITOR) 40 MG tablet Take 1 tablet (40 mg total) by mouth daily. 01/28/20   Kate Sable, MD  carvedilol (COREG) 25 MG tablet Take 1 tablet (25 mg total) by mouth 2 (two) times daily. 03/18/20 06/16/20  Kate Sable, MD  furosemide (LASIX) 40 MG tablet Take 1 tablet (40 mg total) by mouth in the morning and at bedtime. 03/18/20   Kate Sable, MD  Glucose Blood (BLOOD GLUCOSE TEST STRIPS) STRP E11.9 check sugar tid for designated machine 11/05/19   McLean-Scocuzza, Nino Glow, MD  insulin detemir (LEVEMIR FLEXTOUCH) 100 UNIT/ML FlexPen 30 units in am and 30 units qhs with food 01/20/20   McLean-Scocuzza, Nino Glow, MD  insulin lispro (  HUMALOG KWIKPEN) 100 UNIT/ML KwikPen Prn based on sugar If sugar 70-130 0 units If sugar 131-180 4 units If sugar 181-240 8 units If sugar 241-300 10 units If sugar 301-350 12 units If sugar 351-400 16 units If >400 20 units and call doctor 12/31/19   McLean-Scocuzza, Nino Glow, MD  Insulin Pen Needle (PEN NEEDLES) 31G X 8 MM MISC 1 pen by Does not apply route 2 (two) times daily as needed. 12/15/19   McLean-Scocuzza, Nino Glow, MD  Insulin Pen Needle 32G X  4 MM MISC 1 Device by Does not apply route 4 (four) times daily - after meals and at bedtime. 02/12/20   McLean-Scocuzza, Nino Glow, MD  latanoprost (XALATAN) 0.005 % ophthalmic solution Place 1 drop into the left eye at bedtime.    [provider]  Loperamide HCl (IMODIUM PO) Take by mouth.    [provider]  MAGNESIUM SULFATE PO Take 500 mg by mouth.     [provider]  sodium chloride (OCEAN) 0.65 % SOLN nasal spray Place 2 sprays into both nostrils daily as needed for congestion. 09/30/19   McLean-Scocuzza, Nino Glow, MD  Turmeric (QC TUMERIC COMPLEX PO) Take by mouth.     [provider]    Allergies Amoxicillin  Family History  Problem Relation Age of Onset   Diabetes Mother    Heart disease Father     Social History Social History   Tobacco Use   Smoking status: Never Smoker   Smokeless tobacco: Never Used  Scientific laboratory technician Use: Never used  Substance Use Topics   Alcohol use: Never   Drug use: Never    Review of Systems  Constitutional: No fever/chills Eyes: No visual changes. ENT: No sore throat. Cardiovascular: Denies chest pain. Positive for bilateral lower extremity edema. Respiratory: Denies increase in shortness of breath. Gastrointestinal: No abdominal pain.  No nausea, no vomiting.  No diarrhea.  No constipation. Genitourinary: Negative for dysuria. Musculoskeletal: Negative for back pain. Skin: Negative for open wounds or lesions. Neurological: Negative for headaches, focal weakness or numbness. ___________________________________________   PHYSICAL EXAM:  VITAL SIGNS: ED Triage Vitals [04/24/20 1057]  Enc Vitals Group     BP (!) 172/56     Pulse Rate 71     Resp 16     Temp 98.2 F (36.8 C)     Temp Source Oral     SpO2 100 %     Weight 196 lb (88.9 kg)     Height 5\' 5"  (1.651 m)     Head Circumference      Peak Flow      Pain Score 8     Pain Loc      Pain Edu?      Excl. in [place]?      Constitutional: Alert and oriented. Well appearing and in no acute distress. Eyes: Conjunctivae are normal.  Head: Atraumatic. Nose: No congestion/rhinnorhea. Mouth/Throat: Mucous membranes are moist.  Oropharynx non-erythematous. Neck: No stridor. Cardiovascular: Bilateral nonpitting edema of the lower extremities from below knees to above ankles. Chronic vascular skin changes over both ankles.   Respiratory: Normal respiratory effort.  No retractions. Lungs CTAB. Gastrointestinal: Soft and nontender. No distention. No abdominal bruits. No CVA tenderness. Musculoskeletal: No lower extremity tenderness nor edema.  No joint effusions. Neurologic:  Normal speech and language. No gross focal neurologic deficits are appreciated. No gait instability. Skin:  Skin is warm, dry and intact. No rash noted. Psychiatric: Mood and  affect are normal. Speech and behavior are normal.  ____________________________________________   LABS (all labs ordered are listed, but only abnormal results are displayed)  Labs Reviewed  BASIC METABOLIC PANEL - Abnormal; Notable for the following components:      Result Value   Potassium 3.2 (*)    Glucose, Bld 119 (*)    Creatinine, Ser 1.55 (*)    GFR calc non Af Amer 32 (*)    GFR calc Af Amer 37 (*)    All other components within normal limits  CBC - Abnormal; Notable for the following components:   WBC 12.3 (*)    All other components within normal limits  MAGNESIUM  BRAIN NATRIURETIC PEPTIDE  TROPONIN I (HIGH SENSITIVITY)   ____________________________________________  EKG  ED ECG REPORT I, Jerrold Haskell, FNP-BC personally viewed and interpreted this ECG.   Date: 04/24/2020  EKG Time: 1109  Rate: 72  Rhythm: unchanged from previous tracings, sinus rhythm  Axis: normal  Intervals:none  ST&T Change: no ST elevation  ____________________________________________  RADIOLOGY  ED MD interpretation:    Chest x-ray is negative for acute  findings  I, Keoki Mchargue, personally viewed and evaluated these images (plain radiographs) as part of my medical decision making, as well as reviewing the written report by the radiologist.  [place] bilateral lower extremities is negative for DVT.  Official radiology report(s): DG Chest 2 View  Result Date: 04/24/2020 CLINICAL DATA:  Shortness of breath with exertion. EXAM: CHEST - 2 VIEW COMPARISON:  None. FINDINGS: The cardiac silhouette, mediastinal and hilar contours are within normal limits for age. There is mild tortuosity and calcification of the thoracic aorta. The lungs are clear of an acute process. No worrisome pulmonary lesions or pleural effusions. The bony thorax is intact. IMPRESSION: No acute cardiopulmonary findings. Electronically Signed   By: Marijo Sanes M.D.   On: 04/24/2020 12:28   US Venous Img Lower Bilateral  Result Date: 04/24/2020 CLINICAL DATA:  79 year old presenting with acute BILATERAL lower extremity pain and edema. EXAM: BILATERAL LOWER EXTREMITY VENOUS DOPPLER ULTRASOUND TECHNIQUE: Gray-scale sonography with graded compression, as well as color Doppler and duplex ultrasound were performed to evaluate the lower extremity deep venous systems from the level of the common femoral vein and including the common femoral, femoral, profunda femoral, popliteal and calf veins including the posterior tibial, peroneal and gastrocnemius veins when visible. The superficial great saphenous vein was also interrogated. Spectral Doppler was utilized to evaluate flow at rest and with distal augmentation maneuvers in the common femoral, femoral and popliteal veins. COMPARISON:  None. FINDINGS: RIGHT LOWER EXTREMITY Common Femoral Vein: No evidence of thrombus. Normal compressibility, respiratory phasicity and response to augmentation. Saphenofemoral Junction: No evidence of thrombus. Normal compressibility and flow on color Doppler imaging. Profunda Femoral Vein: No evidence of thrombus.  Normal compressibility and flow on color Doppler imaging. Femoral Vein: No evidence of thrombus. Normal compressibility, respiratory phasicity and response to augmentation. Popliteal Vein: No evidence of thrombus. Normal compressibility, respiratory phasicity and response to augmentation. Calf Veins: No evidence of thrombus. Normal compressibility and flow on color Doppler imaging. Superficial Great Saphenous Vein: Not evaluated except at the saphenofemoral junction. Venous Reflux:  Not evaluated. Other Findings:  Calf edema. LEFT LOWER EXTREMITY Common Femoral Vein: No evidence of thrombus. Normal compressibility, respiratory phasicity and response to augmentation. Saphenofemoral Junction: No evidence of thrombus. Normal compressibility and flow on color Doppler imaging. Profunda Femoral Vein: No evidence of thrombus. Normal compressibility and flow on color Doppler imaging.  Femoral Vein: No evidence of thrombus. Normal compressibility, respiratory phasicity and response to augmentation. Popliteal Vein: No evidence of thrombus. Normal compressibility, respiratory phasicity and response to augmentation. Calf Veins: No evidence of thrombus. Normal compressibility and flow on color Doppler imaging. Superficial Great Saphenous Vein: Not evaluated except at the saphenofemoral junction. Venous Reflux:  Not evaluated. Other Findings:  Calf edema. IMPRESSION: No evidence of DVT involving either lower extremity. Electronically Signed   By: Evangeline Dakin M.D.   On: 04/24/2020 13:46    ____________________________________________   PROCEDURES  Procedure(s) performed (including Critical Care):  Procedures  ____________________________________________   INITIAL IMPRESSION / ASSESSMENT AND PLAN     79 year old female presenting to the emergency department for evaluation of bilateral lower extremity pain and swelling.  See HPI for further details.  While awaiting ER room assignment, labs were drawn which show  a mild hypokalemia at 3.2, BUN of 22 with a creatinine of 1.55, mild leukocytosis at 12.3, and negative troponin.  BUN and creatinine are near patient's baseline.  BNp is normal as is her magnesium.  Plan will be to get an ultrasound of the bilateral lower extremities rule out DVT.  DIFFERENTIAL DIAGNOSIS  DVT, CHF, venous stasis  ED COURSE  Doppler studies of bilateral lower extremities are negative for DVT.  Patient will be encouraged to wear compression stockings and will be provided with a prescription for 20 to 30 degree compression.  She will be advised to see her primary care provider for follow-up and to call the vascular surgeon for a consult appointment.  Upon discharge, the patient states that she does have compression stockings and used to wear them most of the time that she is now 2-week in her hands to pull them up.  She was encouraged to discuss this with her primary care provider for further recommendations.    ___________________________________________   FINAL CLINICAL IMPRESSION(S) / ED DIAGNOSES  Final diagnoses:  Peripheral edema     ED Discharge Orders         Ordered    Compression stockings       Comments: Bilateral below knee; 20-30 degree of compression   04/24/20 1359           Vickie Kemp was evaluated in Emergency Department on 04/24/2020 for the symptoms described in the history of present illness. She was evaluated in the context of the global COVID-19 pandemic, which necessitated consideration that the patient might be at risk for infection with the SARS-CoV-2 virus that causes COVID-19. Institutional protocols and algorithms that pertain to the evaluation of patients at risk for COVID-19 are in a state of rapid change based on information released by regulatory bodies including the CDC and federal and state organizations. These policies and algorithms were followed during the patient's care in the ED.   Note:  This document was prepared using  Dragon voice recognition software and may include unintentional dictation errors.   Victorino Dike, FNP 04/24/20 1801    Arta Silence, MD 04/25/20 [phone]

## 2020-04-26 ENCOUNTER — Ambulatory Visit: Payer: Medicare Other | Admitting: Cardiology

## 2020-04-26 ENCOUNTER — Telehealth: Payer: Self-pay | Admitting: Internal Medicine

## 2020-04-26 ENCOUNTER — Encounter: Payer: Self-pay | Admitting: Internal Medicine

## 2020-04-26 NOTE — Telephone Encounter (Signed)
Pt called wanting to get a referral to GI for her on going diarrhea Pt would like somewhere here in [place]

## 2020-04-27 ENCOUNTER — Other Ambulatory Visit (INDEPENDENT_AMBULATORY_CARE_PROVIDER_SITE_OTHER): Payer: Self-pay | Admitting: Nurse Practitioner

## 2020-04-27 NOTE — Telephone Encounter (Signed)
Please advise, Patient seen for this 02/12/20

## 2020-04-29 ENCOUNTER — Other Ambulatory Visit (INDEPENDENT_AMBULATORY_CARE_PROVIDER_SITE_OTHER): Payer: Self-pay | Admitting: Nurse Practitioner

## 2020-04-29 ENCOUNTER — Ambulatory Visit: Payer: Medicare Other | Admitting: Cardiology

## 2020-04-29 DIAGNOSIS — M7989 Other specified soft tissue disorders: Secondary | ICD-10-CM

## 2020-04-29 DIAGNOSIS — M79606 Pain in leg, unspecified: Secondary | ICD-10-CM

## 2020-04-29 NOTE — Telephone Encounter (Signed)
patient called in about referral to GI

## 2020-04-29 NOTE — Telephone Encounter (Signed)
Please advise 

## 2020-04-30 ENCOUNTER — Other Ambulatory Visit: Payer: Self-pay | Admitting: Internal Medicine

## 2020-04-30 NOTE — Addendum Note (Signed)
Addended by: Orland Mustard on: 04/30/2020 12:56 PM   Modules accepted: Orders

## 2020-04-30 NOTE — Addendum Note (Signed)
Addended by: Orland Mustard on: 04/30/2020 01:01 PM   Modules accepted: Orders

## 2020-04-30 NOTE — Telephone Encounter (Signed)
Pt's sister called about referral to GI

## 2020-04-30 NOTE — Telephone Encounter (Signed)
Please advise 

## 2020-04-30 NOTE — Telephone Encounter (Signed)
Patient's sister Tonia Ghent informed and verbalized understanding.

## 2020-04-30 NOTE — Telephone Encounter (Signed)
Referrals in vascular/GI  Sorry ive been mostly out of the office x 3-4 weeks  Thanks for your patience

## 2020-05-03 ENCOUNTER — Encounter (INDEPENDENT_AMBULATORY_CARE_PROVIDER_SITE_OTHER): Payer: Self-pay | Admitting: Nurse Practitioner

## 2020-05-03 ENCOUNTER — Ambulatory Visit (INDEPENDENT_AMBULATORY_CARE_PROVIDER_SITE_OTHER): Payer: Medicare Other | Admitting: Nurse Practitioner

## 2020-05-03 ENCOUNTER — Other Ambulatory Visit: Payer: Self-pay

## 2020-05-03 ENCOUNTER — Ambulatory Visit (INDEPENDENT_AMBULATORY_CARE_PROVIDER_SITE_OTHER): Payer: Medicare Other

## 2020-05-03 VITALS — BP 119/65 | HR 71 | Ht 65.0 in | Wt 189.0 lb

## 2020-05-03 DIAGNOSIS — E782 Mixed hyperlipidemia: Secondary | ICD-10-CM

## 2020-05-03 DIAGNOSIS — I10 Essential (primary) hypertension: Secondary | ICD-10-CM

## 2020-05-03 DIAGNOSIS — M79606 Pain in leg, unspecified: Secondary | ICD-10-CM | POA: Diagnosis not present

## 2020-05-03 DIAGNOSIS — I8311 Varicose veins of right lower extremity with inflammation: Secondary | ICD-10-CM

## 2020-05-03 DIAGNOSIS — I8312 Varicose veins of left lower extremity with inflammation: Secondary | ICD-10-CM | POA: Diagnosis not present

## 2020-05-03 DIAGNOSIS — I129 Hypertensive chronic kidney disease with stage 1 through stage 4 chronic kidney disease, or unspecified chronic kidney disease: Secondary | ICD-10-CM | POA: Insufficient documentation

## 2020-05-03 DIAGNOSIS — N184 Chronic kidney disease, stage 4 (severe): Secondary | ICD-10-CM | POA: Insufficient documentation

## 2020-05-03 DIAGNOSIS — M7989 Other specified soft tissue disorders: Secondary | ICD-10-CM | POA: Diagnosis not present

## 2020-05-03 DIAGNOSIS — E119 Type 2 diabetes mellitus without complications: Secondary | ICD-10-CM | POA: Insufficient documentation

## 2020-05-10 ENCOUNTER — Encounter (INDEPENDENT_AMBULATORY_CARE_PROVIDER_SITE_OTHER): Payer: Self-pay | Admitting: Nurse Practitioner

## 2020-05-10 ENCOUNTER — Ambulatory Visit (INDEPENDENT_AMBULATORY_CARE_PROVIDER_SITE_OTHER): Payer: Medicare Other | Admitting: Gastroenterology

## 2020-05-10 ENCOUNTER — Encounter: Payer: Self-pay | Admitting: Gastroenterology

## 2020-05-10 VITALS — BP 185/77 | HR 75 | Temp 98.1°F | Ht 64.0 in | Wt 186.5 lb

## 2020-05-10 DIAGNOSIS — K529 Noninfective gastroenteritis and colitis, unspecified: Secondary | ICD-10-CM

## 2020-05-10 NOTE — Progress Notes (Signed)
Subjective:    Patient ID: Vickie Kemp, female    DOB: May 21, 1941, 79 y.o.   MRN: [id number] Chief Complaint  Patient presents with  . New Patient (Initial Visit)    [facility] referral LE pain swelling reflux    The patient is seen for evaluation of symptomatic varicose veins. The patient relates burning and stinging which worsened steadily throughout the course of the day, particularly with standing. The patient also notes an aching and throbbing pain over the varicosities, particularly with prolonged dependent positions. The symptoms are significantly improved with elevation.  The patient also notes that during hot weather the symptoms are greatly intensified. The patient states the pain from the varicose veins interferes with work, daily exercise, shopping and household maintenance. At this point, the symptoms are persistent and severe enough that they're having a negative impact on lifestyle and are interfering with daily activities.  There is no history of DVT, PE or superficial thrombophlebitis. There is no history of ulceration or hemorrhage. The patient denies a significant family history of varicose veins.   The patient has not worn graduated compression in the past. At the present time the patient has not been using over-the-counter analgesics. There is no history of prior surgical intervention or sclerotherapy.  No evidence of DVT or superficial venous thrombosis bilaterally.  There is evidence of deep venous insufficiency bilaterally in the common femoral veins.  The right lower extremity has reflux in the great saphenous vein at the saphenofemoral junction extending to the knee.  The left lower extremity has evidence of reflux in the great saphenous vein at the saphenofemoral junction extending to the proximal thigh.    Review of Systems  Cardiovascular: Positive for leg swelling.  Skin: Positive for color change (stasis dermatitis).  All other systems reviewed and are  negative.      Objective:   Physical Exam Vitals reviewed.  HENT:     Head: Normocephalic.  Cardiovascular:     Rate and Rhythm: Normal rate and regular rhythm.     Pulses: Normal pulses.  Pulmonary:     Effort: Pulmonary effort is normal.  Musculoskeletal:     Right lower leg: Edema present.     Left lower leg: Edema present.  Neurological:     Mental Status: She is alert and oriented to person, place, and time.  Psychiatric:        Mood and Affect: Mood normal.        Behavior: Behavior normal.        Thought Content: Thought content normal.        Judgment: Judgment normal.     BP 119/65   Pulse 71   Ht 5\' 5"  (1.651 m)   Wt 189 lb (85.7 kg)   BMI 31.45 kg/m   Past Medical History:  Diagnosis Date  . Chronic kidney disease   . Coronary artery disease    MI, pci to mid LAD 2014. LHC 80%OM, 60%RCA, 90% LAD  . Diabetes mellitus without complication ([place])   . Hypertension   . Sleep apnea     Social History   Socioeconomic History  . Marital status: Single    Spouse name: Not on file  . Number of children: Not on file  . Years of education: Not on file  . Highest education level: Not on file  Occupational History  . Not on file  Tobacco Use  . Smoking status: Never Smoker  . Smokeless tobacco: Never Used  Vaping Use  .  Vaping Use: Never used  Substance and Sexual Activity  . Alcohol use: Never  . Drug use: Never  . Sexual activity: Not on file  Other Topics Concern  . Not on file  Social History Narrative   Used to live in [place] Determinants of Health   Financial Resource Strain:   . Difficulty of Paying Living Expenses: Not on file  Food Insecurity:   . Worried About Charity fundraiser in the Last Year: Not on file  . Ran Out of Food in the Last Year: Not on file  Transportation Needs:   . Lack of Transportation (Medical): Not on file  . Lack of Transportation (Non-Medical): Not on file  Physical Activity:   .  Days of Exercise per Week: Not on file  . Minutes of Exercise per Session: Not on file  Stress:   . Feeling of Stress : Not on file  Social Connections:   . Frequency of Communication with Friends and Family: Not on file  . Frequency of Social Gatherings with Friends and Family: Not on file  . Attends Religious Services: Not on file  . Active Member of Clubs or Organizations: Not on file  . Attends Archivist Meetings: Not on file  . Marital Status: Not on file  Intimate Partner Violence:   . Fear of Current or Ex-Partner: Not on file  . Emotionally Abused: Not on file  . Physically Abused: Not on file  . Sexually Abused: Not on file    Past Surgical History:  Procedure Laterality Date  . ACNE CYST REMOVAL  2008   Groin area  . CARDIAC CATHETERIZATION    . CORONARY ANGIOPLASTY      Family History  Problem Relation Age of Onset  . Diabetes Mother   . Heart disease Father     Allergies  Allergen Reactions  . Amoxicillin Itching       Assessment & Plan:   1. Varicose veins of Kemp lower extremities with inflammation  Recommend:  The patient has large symptomatic varicose veins that are painful and associated with swelling.  I have had a long discussion with the patient regarding  varicose veins and why they cause symptoms.  Patient will begin wearing graduated compression stockings class 1 on a daily basis, beginning first thing in the morning and removing them in the evening. The patient is instructed specifically not to sleep in the stockings.    The patient  will also begin using over-the-counter analgesics such as Motrin 600 mg po TID to help control the symptoms.    In addition, behavioral modification including elevation during the day will be initiated.    Pending the results of these changes the  patient will be reevaluated in three months.   Further plans will be based on the ultrasound results and whether conservative therapies are successful at  eliminating the pain and swelling.   2. Essential hypertension Continue antihypertensive medications as already ordered, these medications have been reviewed and there are no changes at this time.   3. Mixed hyperlipidemia Continue statin as ordered and reviewed, no changes at this time    Current Outpatient Medications on File Prior to Visit  Medication Sig Dispense Refill  . acetaminophen (TYLENOL) 650 MG CR tablet Take 650 mg by mouth 3 (three) times daily as needed.    [name] amLODipine (NORVASC) 10 MG tablet Take 10 mg by mouth daily.    [name]  amLODipine (NORVASC) 5 MG tablet Take 1 tablet (5 mg total) by mouth daily. 30 tablet 3  . aspirin EC 81 MG tablet Take 81 mg by mouth daily.    [name] atorvastatin (LIPITOR) 40 MG tablet Take 1 tablet (40 mg total) by mouth daily. 90 tablet 0  . carvedilol (COREG) 25 MG tablet Take 1 tablet (25 mg total) by mouth 2 (two) times daily. 60 tablet 3  . furosemide (LASIX) 40 MG tablet Take 1 tablet (40 mg total) by mouth in the morning and at bedtime. 60 tablet 3  . Glucose Blood (BLOOD GLUCOSE TEST STRIPS) STRP E11.9 check sugar tid for designated machine 300 strip 5  . insulin detemir (LEVEMIR FLEXTOUCH) 100 UNIT/ML FlexPen 30 units in am and 30 units qhs with food 5 pen 11  . insulin lispro (HUMALOG KWIKPEN) 100 UNIT/ML KwikPen Prn based on sugar If sugar 70-130 0 units If sugar 131-180 4 units If sugar 181-240 8 units If sugar 241-300 10 units If sugar 301-350 12 units If sugar 351-400 16 units If >400 20 units and call doctor 15 mL 11  . Insulin Pen Needle (PEN NEEDLES) 31G X 8 MM MISC 1 pen by Does not apply route 2 (two) times daily as needed. 100 each 3  . Insulin Pen Needle 32G X 4 MM MISC 1 Device by Does not apply route 4 (four) times daily - after meals and at bedtime. 360 each 3  . latanoprost (XALATAN) 0.005 % ophthalmic solution Place 1 drop into the left eye at bedtime.    [name] losartan (COZAAR) 100 MG tablet Take 100 mg by mouth daily.    . Turmeric  (QC TUMERIC COMPLEX PO) Take by mouth.     . Loperamide HCl (IMODIUM PO) Take by mouth. (Patient not taking: Reported on 05/03/2020)    . MAGNESIUM SULFATE PO Take 500 mg by mouth.  (Patient not taking: Reported on 05/03/2020)    . sodium chloride (OCEAN) 0.65 % SOLN nasal spray Place 2 sprays into Kemp nostrils daily as needed for congestion. (Patient not taking: Reported on 05/03/2020) 30 mL 11   No current facility-administered medications on file prior to visit.    There are no Patient Instructions on file for this visit. No follow-ups on file.   Kris Hartmann, NP

## 2020-05-10 NOTE — Patient Instructions (Signed)
Please take Zenpep 2 capsules with the first bite of every meal and 1 capsule with the first bite of every snack

## 2020-05-10 NOTE — Progress Notes (Signed)
Cephas Darby, MD [address]  [place]  [place], [place] [address]  Main: [phone]  Fax: [phone]    Gastroenterology Consultation  Referring Provider:     McLean-Scocuzza, Olivia Mackie * Primary Care Physician:  McLean-Scocuzza, Nino Glow, MD Primary Gastroenterologist:  Dr. Cephas Darby Reason for Consultation:     Chronic diarrhea        HPI:   Vickie Kemp is a 79 y.o. female referred by Dr. Terese Door, Nino Glow, MD  for consultation & management of diarrhea.  Patient has history of metabolic syndrome, poorly controlled diabetes, HbA1c 9.9 is seen in consultation for nonbloody watery diarrhea that started since 01/2020. She states that she used to have regular bowel movements prior to onset of diarrhea.  1 day, she had a cheese cake followed by diarrhea which is still ongoing.  3-7 bowel movements throughout the day and sometimes at night.  She denies any fecal incontinence.  She denies abdominal pain and bloating.  Her diarrhea is mostly postprandial, in about 30 minutes after she eats.  Her only new medication is losartan that was started about a month ago.  Her weight has been stable.  She denies any rectal bleeding.  She underwent stool studies in 02/2020 which were negative for infection.  She states she has tried Imodium 2 times a day which did not help.  She does eat cheese every day and she follow a brat diet for 2 weeks which not help with her diarrhea.  Most recent labs revealed mild leukocytosis only. She does not smoke or drink alcohol  NSAIDs: None  Antiplts/Anticoagulants/Anti thrombotics: None  GI Procedures: Reports having had a colonoscopy several years ago  Past Medical History:  Diagnosis Date  . Chronic kidney disease   . Coronary artery disease    MI, pci to mid LAD 2014. LHC 80%OM, 60%RCA, 90% LAD  . Diabetes mellitus without complication ([place])   . Hypertension   . Sleep apnea     Past Surgical History:  Procedure Laterality Date  .  ACNE CYST REMOVAL  2008   Groin area  . CARDIAC CATHETERIZATION    . CORONARY ANGIOPLASTY      Current Outpatient Medications:  .  acetaminophen (TYLENOL) 650 MG CR tablet, Take 650 mg by mouth 3 (three) times daily as needed., Disp: , Rfl:  .  amLODipine (NORVASC) 10 MG tablet, Take 10 mg by mouth daily., Disp: , Rfl:  .  aspirin EC 81 MG tablet, Take 81 mg by mouth daily., Disp: , Rfl:  .  atorvastatin (LIPITOR) 40 MG tablet, Take 1 tablet (40 mg total) by mouth daily., Disp: 90 tablet, Rfl: 0 .  carvedilol (COREG) 25 MG tablet, Take 1 tablet (25 mg total) by mouth 2 (two) times daily., Disp: 60 tablet, Rfl: 3 .  furosemide (LASIX) 40 MG tablet, Take 1 tablet (40 mg total) by mouth in the morning and at bedtime., Disp: 60 tablet, Rfl: 3 .  Glucose Blood (BLOOD GLUCOSE TEST STRIPS) STRP, E11.9 check sugar tid for designated machine, Disp: 300 strip, Rfl: 5 .  insulin detemir (LEVEMIR FLEXTOUCH) 100 UNIT/ML FlexPen, 30 units in am and 30 units qhs with food, Disp: 5 pen, Rfl: 11 .  insulin lispro (HUMALOG KWIKPEN) 100 UNIT/ML KwikPen, Prn based on sugar If sugar 70-130 0 units If sugar 131-180 4 units If sugar 181-240 8 units If sugar 241-300 10 units If sugar 301-350 12 units If sugar 351-400 16 units If >400 20  units and call doctor, Disp: 15 mL, Rfl: 11 .  Insulin Pen Needle (PEN NEEDLES) 31G X 8 MM MISC, 1 pen by Does not apply route 2 (two) times daily as needed., Disp: 100 each, Rfl: 3 .  Insulin Pen Needle 32G X 4 MM MISC, 1 Device by Does not apply route 4 (four) times daily - after meals and at bedtime., Disp: 360 each, Rfl: 3 .  latanoprost (XALATAN) 0.005 % ophthalmic solution, Place 1 drop into the left eye at bedtime., Disp: , Rfl:  .  losartan (COZAAR) 100 MG tablet, Take 100 mg by mouth daily., Disp: , Rfl:  .  Turmeric (QC TUMERIC COMPLEX PO), Take by mouth. , Disp: , Rfl:  .  Loperamide HCl (IMODIUM PO), Take by mouth. (Patient not taking: Reported on 05/03/2020), Disp: , Rfl:     Family History  Problem Relation Age of Onset  . Diabetes Mother   . Heart disease Father      Social History   Tobacco Use  . Smoking status: Never Smoker  . Smokeless tobacco: Never Used  Vaping Use  . Vaping Use: Never used  Substance Use Topics  . Alcohol use: Never  . Drug use: Never    Allergies as of 05/10/2020 - Review Complete 05/10/2020  Allergen Reaction Noted  . Amoxicillin Itching 04/28/2019    Review of Systems:    All systems reviewed and negative except where noted in HPI.   Physical Exam:  BP (!) 185/77 (BP Location: Left Arm, Patient Position: Sitting, Cuff Size: Normal)   Pulse 75   Temp 98.1 F (36.7 C) (Oral)   Ht 5\' 4"  (1.626 m)   Wt 186 lb 8 oz (84.6 kg)   BMI 32.01 kg/m  No LMP recorded. Patient is postmenopausal.  General:   Alert,  Well-developed, well-nourished, pleasant and cooperative in NAD Head:  Normocephalic and atraumatic. Eyes:  Sclera clear, no icterus.   Conjunctiva pink. Ears:  Normal auditory acuity. Nose:  No deformity, discharge, or lesions. Mouth:  No deformity or lesions,oropharynx pink & moist. Neck:  Supple; no masses or thyromegaly. Lungs:  Respirations even and unlabored.  Clear throughout to auscultation.   No wheezes, crackles, or rhonchi. No acute distress. Heart:  Regular rate and rhythm; no murmurs, clicks, rubs, or gallops. Abdomen:  Normal bowel sounds. Soft, non-tender and non-distended without masses, hepatosplenomegaly or hernias noted.  No guarding or rebound tenderness.   Rectal: Not performed Msk:  Symmetrical without gross deformities. Good, equal movement & strength bilaterally. Pulses:  Normal pulses noted. Extremities:  No clubbing or edema.  No cyanosis. Neurologic:  Alert and oriented x3;  grossly normal neurologically. Skin:  Intact without significant lesions or rashes. No jaundice. Psych:  Alert and cooperative. Normal mood and affect.  Imaging Studies: No abdominal imaging  Assessment  and Plan:   Vickie Kemp is a 79 y.o. pleasant African-American female with, poorly controlled diabetes is seen in consultation for chronic nonbloody diarrhea  Differentials include lactose intolerance or microscopic colitis or pancreatic insufficiency or bacterial overgrowth or less likely IBD or neuroendocrine tumor Stool studies negative for infection I have discussed with her about upper endoscopy as well as colonoscopy for further evaluation, patient deferred at this time  Recommend to check H. pylori stool antigen, pancreatic fecal elastase levels, fecal calprotectin levels Trial of Zenpep 40 K units 2 capsules with each meal and 1 with snack, samples provided  Follow up in 2 to 3 weeks   Kytzia Gienger  Smitty Pluck, MD

## 2020-05-11 DIAGNOSIS — K529 Noninfective gastroenteritis and colitis, unspecified: Secondary | ICD-10-CM | POA: Diagnosis not present

## 2020-05-12 ENCOUNTER — Encounter: Payer: Self-pay | Admitting: Internal Medicine

## 2020-05-12 ENCOUNTER — Encounter: Payer: Self-pay | Admitting: Gastroenterology

## 2020-05-12 DIAGNOSIS — G4733 Obstructive sleep apnea (adult) (pediatric): Secondary | ICD-10-CM | POA: Diagnosis not present

## 2020-05-14 ENCOUNTER — Other Ambulatory Visit: Payer: Self-pay

## 2020-05-14 ENCOUNTER — Ambulatory Visit (INDEPENDENT_AMBULATORY_CARE_PROVIDER_SITE_OTHER): Payer: Medicare Other | Admitting: Internal Medicine

## 2020-05-14 ENCOUNTER — Encounter: Payer: Self-pay | Admitting: Internal Medicine

## 2020-05-14 ENCOUNTER — Telehealth: Payer: Self-pay

## 2020-05-14 VITALS — BP 150/76 | HR 70 | Temp 98.3°F | Ht 64.0 in | Wt 189.0 lb

## 2020-05-14 DIAGNOSIS — N1832 Chronic kidney disease, stage 3b: Secondary | ICD-10-CM | POA: Diagnosis not present

## 2020-05-14 DIAGNOSIS — Z1329 Encounter for screening for other suspected endocrine disorder: Secondary | ICD-10-CM

## 2020-05-14 DIAGNOSIS — I152 Hypertension secondary to endocrine disorders: Secondary | ICD-10-CM

## 2020-05-14 DIAGNOSIS — Z1231 Encounter for screening mammogram for malignant neoplasm of breast: Secondary | ICD-10-CM

## 2020-05-14 DIAGNOSIS — E1159 Type 2 diabetes mellitus with other circulatory complications: Secondary | ICD-10-CM

## 2020-05-14 DIAGNOSIS — I890 Lymphedema, not elsewhere classified: Secondary | ICD-10-CM

## 2020-05-14 DIAGNOSIS — K529 Noninfective gastroenteritis and colitis, unspecified: Secondary | ICD-10-CM

## 2020-05-14 DIAGNOSIS — R197 Diarrhea, unspecified: Secondary | ICD-10-CM

## 2020-05-14 DIAGNOSIS — R062 Wheezing: Secondary | ICD-10-CM

## 2020-05-14 DIAGNOSIS — D72829 Elevated white blood cell count, unspecified: Secondary | ICD-10-CM

## 2020-05-14 DIAGNOSIS — Z23 Encounter for immunization: Secondary | ICD-10-CM

## 2020-05-14 DIAGNOSIS — E876 Hypokalemia: Secondary | ICD-10-CM

## 2020-05-14 LAB — PANCREATIC ELASTASE, FECAL: Pancreatic Elastase, Fecal: 339 ug Elast./g (ref >200)

## 2020-05-14 LAB — H. PYLORI ANTIGEN, STOOL: H pylori Ag, Stl: NEGATIVE

## 2020-05-14 LAB — CALPROTECTIN, FECAL: Calprotectin, Fecal: 127 ug/g — ABNORMAL HIGH (ref 0–120)

## 2020-05-14 MED ORDER — NA SULFATE-K SULFATE-MG SULF 17.5-3.13-1.6 GM/177ML PO SOLN
354.0000 mL | Freq: Once | ORAL | 0 refills | Status: AC
Start: 2020-05-14 — End: 2020-05-14

## 2020-05-14 NOTE — Patient Instructions (Addendum)
Wheezing=Bronchospasm, Adult -consider allergy medication as needed zyrtec/claritin/allegra (lowest dose) and or albuterol inhaler    Bronchospasm is when airways in the lungs get smaller. When this happens, it can be hard to breathe. You may cough. You may also make a whistling sound when you breathe (wheeze). Follow these instructions at home: Medicines  Take over-the-counter and prescription medicines only as told by your doctor.  If you need to use an inhaler or nebulizer to take your medicine, ask your doctor how to use it.  If you were given a spacer, always use it with your inhaler. Lifestyle  Change your heating and air conditioning filter. Do this at least once a month.  Try not to use fireplaces and wood stoves.  Do not  smoke. Do not  allow smoking in your home.  Try not to use things that have a strong smell, like perfume.  Get rid of pests (such as roaches and mice) and their poop.  Remove any mold from your home.  Keep your house clean. Get rid of dust.  Use cleaning products that have no smell.  Replace carpet with wood, tile, or vinyl flooring.  Use allergy-proof pillows, mattress covers, and box spring covers.  Wash bed sheets and blankets every week. Use hot water. Dry them in a dryer.  Use blankets that are made of polyester or cotton.  Wash your hands often.  Keep pets out of your bedroom.  When you exercise, try not to breathe in cold air. General instructions  Have a plan for getting medical care. Know these things: ? When to call your doctor. ? When to call local emergency services (911 in the [place] ? Where to go in an emergency.  Stay up to date on your shots (immunizations).  When you have an episode: ? Stay calm. ? Relax. ? Breathe slowly. Contact a doctor if:  Your muscles ache.  Your chest hurts.  The color of the mucus you cough up (sputum) changes from clear or white to yellow, green, gray, or bloody.  The mucus you  cough up gets thicker.  You have a fever. Get help right away if:  The whistling sound gets worse, even after you take your medicines.  Your coughing gets worse.  You find it even harder to breathe.  Your chest hurts very much. Summary  Bronchospasm is when airways in the lungs get smaller.  When this happens, it can be hard to breathe. You may cough. You may also make a whistling sound when you breathe.  Stay away from things that cause you to have episodes. These include smoke or dust. This information is not intended to replace advice given to you by your health care provider. Make sure you discuss any questions you have with your health care provider. Document Revised: 07/13/2017 Document Reviewed: 08/03/2016 Elsevier Patient Education  [place].  Hypokalemia Hypokalemia means that the amount of potassium in the blood is lower than normal. Potassium is a chemical (electrolyte) that helps regulate the amount of fluid in the body. It also stimulates muscle tightening (contraction) and helps nerves work properly. Normally, most of the body's potassium is inside cells, and only a very small amount is in the blood. Because the amount in the blood is so small, minor changes to potassium levels in the blood can be life-threatening. What are the causes? This condition may be caused by:  Antibiotic medicine.  Diarrhea or vomiting. Taking too much of a medicine that helps you have  a bowel movement (laxative) can cause diarrhea and lead to hypokalemia.  Chronic kidney disease (CKD).  Medicines that help the body get rid of excess fluid (diuretics).  Eating disorders, such as bulimia.  Low magnesium levels in the body.  Sweating a lot. What are the signs or symptoms? Symptoms of this condition include:  Weakness.  Constipation.  Fatigue.  Muscle cramps.  Mental confusion.  Skipped heartbeats or irregular heartbeat (palpitations).  Tingling or numbness. How  is this diagnosed? This condition is diagnosed with a blood test. How is this treated? This condition may be treated by:  Taking potassium supplements by mouth.  Adjusting the medicines that you take.  Eating more foods that contain a lot of potassium. If your potassium level is very low, you may need to get potassium through an IV and be monitored in the hospital. Follow these instructions at home:   Take over-the-counter and prescription medicines only as told by your health care provider. This includes vitamins and supplements.  Eat a healthy diet. A healthy diet includes fresh fruits and vegetables, whole grains, healthy fats, and lean proteins.  If instructed, eat more foods that contain a lot of potassium. This includes: ? Nuts, such as peanuts and pistachios. ? Seeds, such as sunflower seeds and pumpkin seeds. ? Peas, lentils, and lima beans. ? Whole grain and bran cereals and breads. ? Fresh fruits and vegetables, such as apricots, avocado, bananas, cantaloupe, kiwi, oranges, tomatoes, asparagus, and potatoes. ? Orange juice low sugar 50% less sugar/light  ? Tomato juice. ? Red meats. ? Yogurt.  Keep all follow-up visits as told by your health care provider. This is important. Contact a health care provider if you:  Have weakness that gets worse.  Feel your heart pounding or racing.  Vomit.  Have diarrhea.  Have diabetes (diabetes mellitus) and you have trouble keeping your blood sugar (glucose) in your target range. Get help right away if you:  Have chest pain.  Have shortness of breath.  Have vomiting or diarrhea that lasts for more than 2 days.  Faint. Summary  Hypokalemia means that the amount of potassium in the blood is lower than normal.  This condition is diagnosed with a blood test.  Hypokalemia may be treated by taking potassium supplements, adjusting the medicines that you take, or eating more foods that are high in potassium.  If your  potassium level is very low, you may need to get potassium through an IV and be monitored in the hospital. This information is not intended to replace advice given to you by your health care provider. Make sure you discuss any questions you have with your health care provider. Document Revised: 03/13/2018 Document Reviewed: 03/13/2018 Elsevier Patient Education  [place].

## 2020-05-14 NOTE — Telephone Encounter (Signed)
-----   Message from [name], MD sent at 05/12/2020  5:23 PM EDT ----- Elevated fecal calprotectin levels.  Therefore, recommend colonoscopy to evaluate for underlying colitis  Vickie Kemp

## 2020-05-14 NOTE — Telephone Encounter (Signed)
Called patient and scheduled patient a colonoscopy for 05/27/2020. Went over instructions with patient and mailed and sent to [facility]. Sent prep to pharmacy

## 2020-05-14 NOTE — Progress Notes (Signed)
Chief Complaint  Patient presents with  . Follow-up  . Immunizations    flu shot   F/u  1. HTN sl elevated today on norvasc 10, lasix 40 bid, coreg 25 mg bid,losartan 100 mg qd with CKD 3B est with renal and cards labs 04/24/20 K 3.2 pt declines recheck labs today  2. Chronic leg edema saw vascular and compression stockings helping has on currently today will f/u vascular 07/2020 and per pt considering laser ablation  3. DM 2 cbg 136 this am, uncontrolled dm 9.9 A1C 01/15/20 on lipitor 40, levemir 30 mg bid, humalog SSI 4. Wheezing at times per pt ocassionally no h/o asthma/never smoker CXR 04/24/20 negative reason sob 5. Wants flu shot today    Review of Systems  Constitutional: Negative for weight loss.  HENT: Negative for hearing loss.   Eyes: Negative for blurred vision.  Respiratory: Positive for wheezing.   Cardiovascular: Positive for leg swelling. Negative for chest pain.       Leg edema improved  Gastrointestinal: Negative for abdominal pain.  Musculoskeletal: Positive for joint pain. Negative for falls.       6/10 pain in legs and feet  Skin: Negative for rash.  Psychiatric/Behavioral: Negative for depression and memory loss.   Past Medical History:  Diagnosis Date  . Chronic kidney disease   . Coronary artery disease    MI, pci to mid LAD 2014. LHC 80%OM, 60%RCA, 90% LAD  . Diabetes mellitus without complication ([place])   . Hypertension   . Sleep apnea    Past Surgical History:  Procedure Laterality Date  . ACNE CYST REMOVAL  2008   Groin area  . CARDIAC CATHETERIZATION    . CORONARY ANGIOPLASTY     Family History  Problem Relation Age of Onset  . Diabetes Mother   . Heart disease Father    Social History   Socioeconomic History  . Marital status: Single    Spouse name: Not on file  . Number of children: Not on file  . Years of education: Not on file  . Highest education level: Not on file  Occupational History  . Not on file  Tobacco Use  . Smoking  status: Never Smoker  . Smokeless tobacco: Never Used  Vaping Use  . Vaping Use: Never used  Substance and Sexual Activity  . Alcohol use: Never  . Drug use: Never  . Sexual activity: Not on file  Other Topics Concern  . Not on file  Social History Narrative   Used to live in [place] Determinants of Health   Financial Resource Strain:   . Difficulty of Paying Living Expenses: Not on file  Food Insecurity:   . Worried About Charity fundraiser in the Last Year: Not on file  . Ran Out of Food in the Last Year: Not on file  Transportation Needs:   . Lack of Transportation (Medical): Not on file  . Lack of Transportation (Non-Medical): Not on file  Physical Activity:   . Days of Exercise per Week: Not on file  . Minutes of Exercise per Session: Not on file  Stress:   . Feeling of Stress : Not on file  Social Connections:   . Frequency of Communication with Friends and Family: Not on file  . Frequency of Social Gatherings with Friends and Family: Not on file  . Attends Religious Services: Not on file  . Active Member of Clubs or  Organizations: Not on file  . Attends Archivist Meetings: Not on file  . Marital Status: Not on file  Intimate Partner Violence:   . Fear of Current or Ex-Partner: Not on file  . Emotionally Abused: Not on file  . Physically Abused: Not on file  . Sexually Abused: Not on file   Current Meds  Medication Sig  . acetaminophen (TYLENOL) 650 MG CR tablet Take 650 mg by mouth 3 (three) times daily as needed.  [name] amLODipine (NORVASC) 10 MG tablet Take 10 mg by mouth daily.  [name] aspirin EC 81 MG tablet Take 81 mg by mouth daily.  [name] atorvastatin (LIPITOR) 40 MG tablet Take 1 tablet (40 mg total) by mouth daily.  . carvedilol (COREG) 25 MG tablet Take 1 tablet (25 mg total) by mouth 2 (two) times daily.  . furosemide (LASIX) 40 MG tablet Take 1 tablet (40 mg total) by mouth in the morning and at bedtime.  . Glucose Blood  (BLOOD GLUCOSE TEST STRIPS) STRP E11.9 check sugar tid for designated machine  . insulin detemir (LEVEMIR FLEXTOUCH) 100 UNIT/ML FlexPen 30 units in am and 30 units qhs with food  . insulin lispro (HUMALOG KWIKPEN) 100 UNIT/ML KwikPen Prn based on sugar If sugar 70-130 0 units If sugar 131-180 4 units If sugar 181-240 8 units If sugar 241-300 10 units If sugar 301-350 12 units If sugar 351-400 16 units If >400 20 units and call doctor  . Insulin Pen Needle (PEN NEEDLES) 31G X 8 MM MISC 1 pen by Does not apply route 2 (two) times daily as needed.  . Insulin Pen Needle 32G X 4 MM MISC 1 Device by Does not apply route 4 (four) times daily - after meals and at bedtime.  [name] latanoprost (XALATAN) 0.005 % ophthalmic solution Place 1 drop into the left eye at bedtime.  . Loperamide HCl (IMODIUM PO) Take by mouth.   . losartan (COZAAR) 100 MG tablet Take 100 mg by mouth daily.  . [EXPIRED] Na Sulfate-K Sulfate-Mg Sulf 17.5-3.13-1.6 GM/177ML SOLN Take 354 mLs by mouth once for 1 dose.  [name] Pancrelipase, Lip-Prot-Amyl, (ZENPEP PO) Take by mouth.  . Turmeric (QC TUMERIC COMPLEX PO) Take by mouth.    Allergies  Allergen Reactions  . Amoxicillin Itching   Recent Results (from the past 2160 hour(s))  ECHOCARDIOGRAM COMPLETE     Status: None   Collection Time: 03/19/20 10:50 AM  Result Value Ref Range   AR max vel 1.89 cm2   AV Peak grad 9.6 mmHg   Ao pk vel 1.55 m/s   S' Lateral 2.70 cm   Area-P 1/2 3.63 cm2   AV Area VTI 1.82 cm2   AV Mean grad 4.0 mmHg   Single Plane A4C EF 52.1 %   AV Area mean vel 1.80 cm2  Basic Metabolic Panel (BMET)     Status: Abnormal   Collection Time: 03/19/20 11:09 AM  Result Value Ref Range   Sodium 142 135 - 145 mmol/L   Potassium 3.8 3.5 - 5.1 mmol/L   Chloride 101 98 - 111 mmol/L   CO2 28 22 - 32 mmol/L   Glucose, Bld 109 (H) 70 - 99 mg/dL    Comment: Glucose reference range applies only to samples taken after fasting for at least 8 hours.   BUN 20 8 - 23 mg/dL    Creatinine, Ser 1.49 (H) 0.44 - 1.00 mg/dL   Calcium 9.3 8.9 - 10.3 mg/dL   GFR calc  non Af Amer 33 (L) >60 mL/min   GFR calc Af Amer 39 (L) >60 mL/min   Anion gap 13 5 - 15    Comment: Performed at [facility], [address] [place], [place] [address]  HM DIABETES EYE EXAM     Status: Abnormal   Collection Time: 04/20/20 12:00 AM  Result Value Ref Range   HM Diabetic Eye Exam Retinopathy (A) No Retinopathy    Comment: + no tx patty vision 02/19/23  Basic metabolic panel     Status: Abnormal   Collection Time: 04/24/20 11:19 AM  Result Value Ref Range   Sodium 143 135 - 145 mmol/L   Potassium 3.2 (L) 3.5 - 5.1 mmol/L   Chloride 104 98 - 111 mmol/L   CO2 28 22 - 32 mmol/L   Glucose, Bld 119 (H) 70 - 99 mg/dL    Comment: Glucose reference range applies only to samples taken after fasting for at least 8 hours.   BUN 22 8 - 23 mg/dL   Creatinine, Ser 1.55 (H) 0.44 - 1.00 mg/dL   Calcium 9.1 8.9 - 10.3 mg/dL   GFR calc non Af Amer 32 (L) >60 mL/min   GFR calc Af Amer 37 (L) >60 mL/min   Anion gap 11 5 - 15    Comment: Performed at [facility], [address], [place], [place] [address]  Magnesium     Status: None   Collection Time: 04/24/20 11:19 AM  Result Value Ref Range   Magnesium 1.7 1.7 - 2.4 mg/dL    Comment: Performed at [facility], [address], [place], [place] [address]  Brain natriuretic peptide (order ONLY if patient c/o SOB)     Status: None   Collection Time: 04/24/20 11:19 AM  Result Value Ref Range   B Natriuretic Peptide 79.0 0.0 - 100.0 pg/mL    Comment: Performed at [facility], [address] [place], [place] [address]  CBC     Status: Abnormal   Collection Time: 04/24/20 11:19 AM  Result Value Ref Range   WBC 12.3 (H) 4.0 - 10.5 K/uL   RBC 4.20 3.87 - 5.11 MIL/uL   Hemoglobin 12.0 12.0 - 15.0 g/dL   HCT 36.9 36 - 46 %   MCV 87.9 80.0 - 100.0 fL   MCH 28.6 26.0 - 34.0 pg   MCHC 32.5 30.0 - 36.0 g/dL   RDW  13.2 11.5 - 15.5 %   Platelets 200 150 - 400 K/uL   nRBC 0.0 0.0 - 0.2 %    Comment: Performed at [facility], [place], [place] [address]  Troponin I (High Sensitivity)     Status: None   Collection Time: 04/24/20 11:19 AM  Result Value Ref Range   Troponin I (High Sensitivity) 16 <18 ng/L    Comment: (NOTE) Elevated high sensitivity troponin I (hsTnI) values and significant  changes across serial measurements may suggest ACS but many other  chronic and acute conditions are known to elevate hsTnI results.  Refer to the "Links" section for chest pain algorithms and additional  guidance. Performed at [facility], [address] [place], [place] [address]   Calprotectin, Fecal     Status: Abnormal   Collection Time: 05/11/20  1:36 PM   Specimen: Stool  Result Value Ref Range   Calprotectin, Fecal 127 (H) 0 - 120 ug/g    Comment: Concentration     Interpretation   Follow-Up <16 -  50 ug/g     Normal           None >50 -120 ug/g     Borderline       Re-evaluate in 4-6 weeks     >120 ug/g     Abnormal         Repeat as clinically                                    indicated   Pancreatic elastase, fecal     Status: None   Collection Time: 05/11/20  1:36 PM  Result Value Ref Range   Pancreatic Elastase, Fecal 339 >200 ug Elast./g    Comment:        Severe Pancreatic Insufficiency:          <100        Moderate Pancreatic Insufficiency:   100 - 200        Normal:                                   >200   H. pylori antigen, stool     Status: None   Collection Time: 05/11/20  1:36 PM  Result Value Ref Range   H pylori Ag, Stl Negative Negative   Objective  Body mass index is 32.44 kg/m. Wt Readings from Last 3 Encounters:  05/14/20 189 lb (85.7 kg)  05/10/20 186 lb 8 oz (84.6 kg)  05/03/20 189 lb (85.7 kg)   Temp Readings from Last 3 Encounters:  05/14/20 98.3 F (36.8 C) (Oral)  05/10/20 98.1 F (36.7 C) (Oral)  04/24/20 98.2 F (36.8  C) (Oral)   BP Readings from Last 3 Encounters:  05/14/20 (!) 150/76  05/10/20 (!) 185/77  05/03/20 119/65   Pulse Readings from Last 3 Encounters:  05/14/20 70  05/10/20 75  05/03/20 71    Physical Exam Vitals and nursing note reviewed.  Constitutional:      Appearance: Normal appearance. She is well-developed and well-groomed. She is obese.  HENT:     Head: Normocephalic and atraumatic.  Eyes:     Conjunctiva/sclera: Conjunctivae normal.     Pupils: Pupils are equal, round, and reactive to light.  Cardiovascular:     Rate and Rhythm: Normal rate and regular rhythm.     Heart sounds: Normal heart sounds. No murmur heard.      Comments: 1+ edema b/l legs improved compression stockings on today Pulmonary:     Effort: Pulmonary effort is normal.     Breath sounds: Normal breath sounds. No wheezing.  Skin:    General: Skin is warm and dry.  Neurological:     General: No focal deficit present.     Mental Status: She is alert and oriented to person, place, and time. Mental status is at baseline.     Gait: Gait normal.  Psychiatric:        Attention and Perception: Attention and perception normal.        Mood and Affect: Mood and affect normal.        Speech: Speech normal.        Behavior: Behavior normal. Behavior is cooperative.        Thought Content: Thought content normal.        Cognition and Memory: Cognition and memory normal.  Judgment: Judgment normal.     Assessment  Plan  Hypertension associated with diabetes ([place]) Cont meds BP and Diabetes uncontrolled .  amLODipine (NORVASC) 10 MG tablet, Take 10 mg by mouth daily., Disp: , Rfl:  .  aspirin EC 81 MG tablet, Take 81 mg by mouth daily., Disp: , Rfl:  .  atorvastatin (LIPITOR) 40 MG tablet, Take 1 tablet (40 mg total) by mouth daily., Disp: 90 tablet, Rfl: 0 .  carvedilol (COREG) 25 MG tablet, Take 1 tablet (25 mg total) by mouth 2 (two) times daily., Disp: 60 tablet, Rfl: 3 .  furosemide (LASIX) 40  MG tablet, Take 1 tablet (40 mg total) by mouth in the morning and at bedtime., Disp: 60 tablet, Rfl: 3  .  insulin detemir (LEVEMIR FLEXTOUCH) 100 UNIT/ML FlexPen, 30 units in am and 30 units qhs with food, Disp: 5 pen, Rfl: 11 .  insulin lispro (HUMALOG KWIKPEN) 100 UNIT/ML KwikPen, Prn based on sugar If sugar 70-130 0 units If sugar 131-180 4 units If sugar 181-240 8 units If sugar 241-300 10 units If sugar 301-350 12 units If sugar 351-400 16 units If >400 20 units and call doctor, Disp: 15 mL, Rfl: 11  .  losartan (COZAAR) 100 MG tablet, Take 100 mg by mouth daily  Disc pharm D Catie involved in care to control diabetes   Hypokalemia Pt declines labs today bmet, mag  Given list high K foods  Stage 3b chronic kidney disease ([place]) F/u renal   Lymphedema Cont compressions helping  Wheezing ? Bronchospasm  CXR 04/24/20 negative  Consider albuterol inhaler vs pulm consult in future  Consider otc allergy meds   Diarrhea ? Colitis  Pending colonoscopy 05/27/20 per GI  HM Flu shot given today  pna 23 utd ConsiderPrevnar, tdap, shingrixvaccine in the future covid 19 vxutd 2/2consider booster 9/17 to 06/30/20  Hep C neg 06/10/19 Carotid artery plaque b/l  DEXA 11/20/16 osteopenia  Out of pap window Colonoscopysch 05/27/20  Mammogram 10/26/16 negative, declines mammogram for now as of 10/1/21but ordered and pt will sch in future   Never smoker   07/08/16 lumbar mild L4/5 mild disc narrowingGrade I anterolisthesis L4 and L5 with associated disc space narrowing without evidence of fracture. Arthritis 08/12/14 degenerative changes  CT scan 07/24/16 negative  ABI right 0.64, left 0.62 moderate 05/30/17 moderate PVD 05/25/17 left maxillary sinus disease  Specialists  [facility]  Provider: Dr. Olivia Mackie McLean-Scocuzza-Internal Medicine

## 2020-05-17 ENCOUNTER — Encounter (INDEPENDENT_AMBULATORY_CARE_PROVIDER_SITE_OTHER): Payer: Medicare Other | Admitting: Nurse Practitioner

## 2020-05-17 ENCOUNTER — Encounter (INDEPENDENT_AMBULATORY_CARE_PROVIDER_SITE_OTHER): Payer: Medicare Other

## 2020-05-18 ENCOUNTER — Telehealth: Payer: Self-pay | Admitting: Internal Medicine

## 2020-05-18 ENCOUNTER — Telehealth: Payer: Self-pay

## 2020-05-18 DIAGNOSIS — I890 Lymphedema, not elsewhere classified: Secondary | ICD-10-CM | POA: Insufficient documentation

## 2020-05-18 DIAGNOSIS — G4733 Obstructive sleep apnea (adult) (pediatric): Secondary | ICD-10-CM | POA: Diagnosis not present

## 2020-05-18 DIAGNOSIS — I10 Essential (primary) hypertension: Secondary | ICD-10-CM

## 2020-05-18 NOTE — Telephone Encounter (Signed)
sch fasting labs 06/16/20  Thanks

## 2020-05-18 NOTE — Telephone Encounter (Signed)
Spoke with patient and relayed Dr. Thereasa Solo lab recommendation as copied below.  Kate Sable, MD  McLean-Scocuzza, Nino Glow, MD Cc: Kavin Leech, RN Will do.   Kamrynn Melott please have patient obtain bmp 1-2days prior to follow up visit.   Thks  BA  BMP order is in. Patient verbalized understanding and agreed with plan.

## 2020-05-18 NOTE — Telephone Encounter (Signed)
Is pt agreeable with consult for catie pharmacist to try to help get diabetes controlled? This can be phone visit?

## 2020-05-19 NOTE — Telephone Encounter (Signed)
Called and spoke to [place]. She was agreeable to seeing Catie for her Diabetes.

## 2020-05-23 NOTE — Addendum Note (Signed)
Addended by: Orland Mustard on: 05/23/2020 06:42 PM   Modules accepted: Orders

## 2020-05-24 ENCOUNTER — Telehealth: Payer: Self-pay

## 2020-05-24 NOTE — Chronic Care Management (AMB) (Signed)
  Chronic Care Management   Note  05/24/2020 Name: Illyanna Petillo MRN: [id number] DOB: 23-Dec-1940  Cherie Lasalle is a 79 y.o. year old female who is a primary care patient of McLean-Scocuzza, Nino Glow, MD. I reached out to [employer] by phone today in response to a referral sent by Ms. Maziyah Kobashigawa's PCP, Orland Mustard, MD      Ms. Allman was given information about Chronic Care Management services today including:  1. CCM service includes personalized support from designated clinical staff supervised by her physician, including individualized plan of care and coordination with other care providers 2. 24/7 contact phone numbers for assistance for urgent and routine care needs. 3. Service will only be billed when office clinical staff spend 20 minutes or more in a month to coordinate care. 4. Only one practitioner may furnish and bill the service in a calendar month. 5. The patient may stop CCM services at any time (effective at the end of the month) by phone call to the office staff. 6. The patient will be responsible for cost sharing (co-pay) of up to 20% of the service fee (after annual deductible is met).  Patient agreed to services and verbal consent obtained.   Follow up plan: Telephone appointment with care management team member scheduled [id number] D 06/18/2020  Noreene Larsson, [address], [place], [place] [address] Direct Dial: [phone] [email or web address]

## 2020-05-25 ENCOUNTER — Telehealth: Payer: Self-pay

## 2020-05-25 ENCOUNTER — Other Ambulatory Visit: Payer: Self-pay

## 2020-05-25 ENCOUNTER — Other Ambulatory Visit
Admission: RE | Admit: 2020-05-25 | Discharge: 2020-05-25 | Disposition: A | Discharge status: Home / Self Care | Payer: Medicare Other | Source: Ambulatory Visit | Attending: Gastroenterology | Admitting: Gastroenterology

## 2020-05-25 DIAGNOSIS — Z01812 Encounter for preprocedural laboratory examination: Secondary | ICD-10-CM | POA: Insufficient documentation

## 2020-05-25 DIAGNOSIS — Z20822 Contact with and (suspected) exposure to covid-19: Secondary | ICD-10-CM | POA: Diagnosis not present

## 2020-05-25 NOTE — Telephone Encounter (Signed)
Returned patient's call. Patient had questions regarding someone having to stay the entire time of procedure. I informed patient that someone needs to stay whether inside or in the car. Pt verbalized understanding.

## 2020-05-26 ENCOUNTER — Encounter: Payer: Self-pay | Admitting: Gastroenterology

## 2020-05-26 LAB — SARS CORONAVIRUS 2 (TAT 6-24 HRS): SARS Coronavirus 2: NEGATIVE

## 2020-05-27 ENCOUNTER — Ambulatory Visit: Payer: Medicare Other | Admitting: Anesthesiology

## 2020-05-27 ENCOUNTER — Encounter: Payer: Self-pay | Admitting: Gastroenterology

## 2020-05-27 ENCOUNTER — Encounter
Admission: RE | Disposition: A | Discharge status: Home / Self Care | Payer: Self-pay | Source: Home / Self Care | Attending: Gastroenterology

## 2020-05-27 ENCOUNTER — Ambulatory Visit
Admission: RE | Admit: 2020-05-27 | Discharge: 2020-05-27 | Disposition: A | Discharge status: Home / Self Care | Payer: Medicare Other | Attending: Gastroenterology | Admitting: Gastroenterology

## 2020-05-27 DIAGNOSIS — E1122 Type 2 diabetes mellitus with diabetic chronic kidney disease: Secondary | ICD-10-CM | POA: Diagnosis not present

## 2020-05-27 DIAGNOSIS — I129 Hypertensive chronic kidney disease with stage 1 through stage 4 chronic kidney disease, or unspecified chronic kidney disease: Secondary | ICD-10-CM | POA: Insufficient documentation

## 2020-05-27 DIAGNOSIS — Z881 Allergy status to other antibiotic agents status: Secondary | ICD-10-CM | POA: Insufficient documentation

## 2020-05-27 DIAGNOSIS — K635 Polyp of colon: Secondary | ICD-10-CM

## 2020-05-27 DIAGNOSIS — K529 Noninfective gastroenteritis and colitis, unspecified: Secondary | ICD-10-CM | POA: Diagnosis not present

## 2020-05-27 DIAGNOSIS — I2510 Atherosclerotic heart disease of native coronary artery without angina pectoris: Secondary | ICD-10-CM | POA: Insufficient documentation

## 2020-05-27 DIAGNOSIS — D122 Benign neoplasm of ascending colon: Secondary | ICD-10-CM | POA: Insufficient documentation

## 2020-05-27 DIAGNOSIS — Z79899 Other long term (current) drug therapy: Secondary | ICD-10-CM | POA: Diagnosis not present

## 2020-05-27 DIAGNOSIS — Z794 Long term (current) use of insulin: Secondary | ICD-10-CM | POA: Insufficient documentation

## 2020-05-27 DIAGNOSIS — G4730 Sleep apnea, unspecified: Secondary | ICD-10-CM | POA: Diagnosis not present

## 2020-05-27 DIAGNOSIS — N189 Chronic kidney disease, unspecified: Secondary | ICD-10-CM | POA: Insufficient documentation

## 2020-05-27 DIAGNOSIS — Z833 Family history of diabetes mellitus: Secondary | ICD-10-CM | POA: Insufficient documentation

## 2020-05-27 DIAGNOSIS — Z7982 Long term (current) use of aspirin: Secondary | ICD-10-CM | POA: Insufficient documentation

## 2020-05-27 DIAGNOSIS — Z1211 Encounter for screening for malignant neoplasm of colon: Secondary | ICD-10-CM | POA: Diagnosis not present

## 2020-05-27 DIAGNOSIS — M1990 Unspecified osteoarthritis, unspecified site: Secondary | ICD-10-CM | POA: Diagnosis not present

## 2020-05-27 DIAGNOSIS — I252 Old myocardial infarction: Secondary | ICD-10-CM | POA: Diagnosis not present

## 2020-05-27 DIAGNOSIS — E1151 Type 2 diabetes mellitus with diabetic peripheral angiopathy without gangrene: Secondary | ICD-10-CM | POA: Diagnosis not present

## 2020-05-27 DIAGNOSIS — Z8249 Family history of ischemic heart disease and other diseases of the circulatory system: Secondary | ICD-10-CM | POA: Diagnosis not present

## 2020-05-27 HISTORY — PX: COLONOSCOPY WITH PROPOFOL: SHX5780

## 2020-05-27 LAB — GLUCOSE, CAPILLARY: Glucose-Capillary: 163 mg/dL — ABNORMAL HIGH (ref 70–99)

## 2020-05-27 SURGERY — COLONOSCOPY WITH PROPOFOL
Anesthesia: General

## 2020-05-27 MED ORDER — PROPOFOL 500 MG/50ML IV EMUL
INTRAVENOUS | Status: DC | PRN
  Administered 2020-05-27: 135 ug/kg/min via INTRAVENOUS

## 2020-05-27 MED ORDER — PHENYLEPHRINE HCL (PRESSORS) 10 MG/ML IV SOLN
INTRAVENOUS | Status: DC | PRN
  Administered 2020-05-27: 200 ug via INTRAVENOUS

## 2020-05-27 MED ORDER — SODIUM CHLORIDE 0.9 % IV SOLN
INTRAVENOUS | Status: DC
  Administered 2020-05-27: 1000 mL via INTRAVENOUS

## 2020-05-27 MED ORDER — PROPOFOL 10 MG/ML IV BOLUS
INTRAVENOUS | Status: DC | PRN
  Administered 2020-05-27: 70 mg via INTRAVENOUS

## 2020-05-27 NOTE — H&P (Signed)
Cephas Darby, MD [address]  [place]  [place], [place] [address]  Main: [phone]  Fax: [phone] Pager: [phone]  Primary Care Physician:  McLean-Scocuzza, Nino Glow, MD Primary Gastroenterologist:  Dr. Cephas Darby  Pre-Procedure History & Physical: HPI:  Vickie Kemp is a 79 y.o. female is here for an colonoscopy.   Past Medical History:  Diagnosis Date  . Chronic kidney disease   . Coronary artery disease    MI, pci to mid LAD 2014. LHC 80%OM, 60%RCA, 90% LAD  . Diabetes mellitus without complication ([place])   . Hypertension   . Sleep apnea     Past Surgical History:  Procedure Laterality Date  . ACNE CYST REMOVAL  2008   Groin area  . CARDIAC CATHETERIZATION    . CORONARY ANGIOPLASTY      Prior to Admission medications   Medication Sig Start Date End Date Taking? Authorizing Provider  acetaminophen (TYLENOL) 650 MG CR tablet Take 650 mg by mouth 3 (three) times daily as needed.   Yes [provider]  amLODipine (NORVASC) 10 MG tablet Take 10 mg by mouth daily. 04/21/20  Yes [provider]  aspirin EC 81 MG tablet Take 81 mg by mouth daily.   Yes [provider]  atorvastatin (LIPITOR) 40 MG tablet Take 1 tablet (40 mg total) by mouth daily. 01/28/20  Yes Agbor-Etang, Aaron Edelman, MD  carvedilol (COREG) 25 MG tablet Take 1 tablet (25 mg total) by mouth 2 (two) times daily. 03/18/20 06/16/20 Yes Agbor-Etang, Aaron Edelman, MD  furosemide (LASIX) 40 MG tablet Take 1 tablet (40 mg total) by mouth in the morning and at bedtime. 03/18/20  Yes Kate Sable, MD  Glucose Blood (BLOOD GLUCOSE TEST STRIPS) STRP E11.9 check sugar tid for designated machine 11/05/19  Yes McLean-Scocuzza, Nino Glow, MD  insulin detemir (LEVEMIR FLEXTOUCH) 100 UNIT/ML FlexPen 30 units in am and 30 units qhs with food 01/20/20  Yes McLean-Scocuzza, Nino Glow, MD  insulin lispro (HUMALOG KWIKPEN) 100 UNIT/ML KwikPen Prn based on sugar If sugar 70-130 0 units If sugar 131-180  4 units If sugar 181-240 8 units If sugar 241-300 10 units If sugar 301-350 12 units If sugar 351-400 16 units If >400 20 units and call doctor 12/31/19  Yes McLean-Scocuzza, Nino Glow, MD  Insulin Pen Needle (PEN NEEDLES) 31G X 8 MM MISC 1 pen by Does not apply route 2 (two) times daily as needed. 12/15/19  Yes McLean-Scocuzza, Nino Glow, MD  Insulin Pen Needle 32G X 4 MM MISC 1 Device by Does not apply route 4 (four) times daily - after meals and at bedtime. 02/12/20  Yes McLean-Scocuzza, Nino Glow, MD  latanoprost (XALATAN) 0.005 % ophthalmic solution Place 1 drop into the left eye at bedtime.   Yes [provider]  Loperamide HCl (IMODIUM PO) Take by mouth.    Yes [provider]  losartan (COZAAR) 100 MG tablet Take 100 mg by mouth daily. 03/31/20 03/31/21 Yes [provider]  Turmeric (QC TUMERIC COMPLEX PO) Take by mouth.    Yes [provider]  Pancrelipase, Lip-Prot-Amyl, (ZENPEP PO) Take by mouth. Patient not taking: Reported on 05/27/2020    [provider]    Allergies as of 05/14/2020 - Review Complete 05/14/2020  Allergen Reaction Noted  . Amoxicillin Itching 04/28/2019    Family History  Problem Relation Age of Onset  . Diabetes Mother   . Heart disease Father     Social History   Socioeconomic History  .  Marital status: Single    Spouse name: Not on file  . Number of children: Not on file  . Years of education: Not on file  . Highest education level: Not on file  Occupational History  . Not on file  Tobacco Use  . Smoking status: Never Smoker  . Smokeless tobacco: Never Used  Vaping Use  . Vaping Use: Never used  Substance and Sexual Activity  . Alcohol use: Never  . Drug use: Never  . Sexual activity: Not on file  Other Topics Concern  . Not on file  Social History Narrative   Used to live in [place] Determinants of Health   Financial Resource Strain:   . Difficulty of Paying Living Expenses:  Not on file  Food Insecurity:   . Worried About Charity fundraiser in the Last Year: Not on file  . Ran Out of Food in the Last Year: Not on file  Transportation Needs:   . Lack of Transportation (Medical): Not on file  . Lack of Transportation (Non-Medical): Not on file  Physical Activity:   . Days of Exercise per Week: Not on file  . Minutes of Exercise per Session: Not on file  Stress:   . Feeling of Stress : Not on file  Social Connections:   . Frequency of Communication with Friends and Family: Not on file  . Frequency of Social Gatherings with Friends and Family: Not on file  . Attends Religious Services: Not on file  . Active Member of Clubs or Organizations: Not on file  . Attends Archivist Meetings: Not on file  . Marital Status: Not on file  Intimate Partner Violence:   . Fear of Current or Ex-Partner: Not on file  . Emotionally Abused: Not on file  . Physically Abused: Not on file  . Sexually Abused: Not on file    Review of Systems: See HPI, otherwise negative ROS  Physical Exam: BP (!) 152/104   Pulse 66   Temp (!) 97.4 F (36.3 C) (Temporal)   Resp 18   Ht 5\' 4"  (1.626 m)   Wt 84.8 kg   LMP  (LMP Unknown)   SpO2 99%   BMI 32.10 kg/m  General:   Alert,  pleasant and cooperative in NAD Head:  Normocephalic and atraumatic. Neck:  Supple; no masses or thyromegaly. Lungs:  Clear throughout to auscultation.    Heart:  Regular rate and rhythm. Abdomen:  Soft, nontender and nondistended. Normal bowel sounds, without guarding, and without rebound.   Neurologic:  Alert and  oriented x4;  grossly normal neurologically.  Impression/Plan: Vickie Kemp is here for an colonoscopy to be performed for chronic diarrhea  Risks, benefits, limitations, and alternatives regarding  colonoscopy have been reviewed with the patient.  Questions have been answered.  All parties agreeable.   Sherri Sear, MD  05/27/2020, 10:43 AM

## 2020-05-27 NOTE — Transfer of Care (Signed)
Immediate Anesthesia Transfer of Care Note  Patient: Vickie Kemp  Procedure(s) Performed: COLONOSCOPY WITH PROPOFOL (N/A )  Patient Location: PACU and Endoscopy Unit  Anesthesia Type:General  Level of Consciousness: drowsy  Airway & Oxygen Therapy: Patient Spontanous Breathing  Post-op Assessment: Report given to RN and Post -op Vital signs reviewed and stable  Post vital signs: Reviewed and stable  Last Vitals:  Vitals Value Taken Time  BP 128/49 05/27/20 1248  Temp 36.7 C 05/27/20 1244  Pulse 63 05/27/20 1248  Resp 16 05/27/20 1248  SpO2 91 % 05/27/20 1248  Vitals shown include unvalidated device data.  Last Pain:  Vitals:   05/27/20 1244  TempSrc: Temporal  PainSc: Asleep         Complications: No complications documented.

## 2020-05-27 NOTE — Op Note (Signed)
[facility] Gastroenterology Patient Name: Vickie Kemp Procedure Date: 05/27/2020 11:55 AM MRN: [id number] Account #: [id number] Date of Birth: 10-13-1940 Admit Type: Outpatient Age: 79 Room: [facility] ENDO ROOM 1 Gender: Female Note Status: Finalized Procedure:             Colonoscopy Indications:           Chronic diarrhea Providers:             [name] MD, MD Referring MD:          Nino Glow Mclean-Scocuzza MD, MD (Referring MD) Medicines:             Monitored Anesthesia Care Complications:         No immediate complications. Estimated blood loss: None. Procedure:             Pre-Anesthesia Assessment:                        - Prior to the procedure, a History and Physical was                         performed, and patient medications and allergies were                         reviewed. The patient is competent. The risks and                         benefits of the procedure and the sedation options and                         risks were discussed with the patient. All questions                         were answered and informed consent was obtained.                         Patient identification and proposed procedure were                         verified by the physician, the nurse, the                         anesthesiologist, the anesthetist and the technician                         in the pre-procedure area in the procedure room in the                         endoscopy suite. Mental Status Examination: alert and                         oriented. Airway Examination: normal oropharyngeal                         airway and neck mobility. Respiratory Examination:                         clear to auscultation. CV Examination: normal.  Prophylactic Antibiotics: The patient does not require                         prophylactic antibiotics. Prior Anticoagulants: The                         patient has taken no previous  anticoagulant or                         antiplatelet agents. ASA Grade Assessment: III - A                         patient with severe systemic disease. After reviewing                         the risks and benefits, the patient was deemed in                         satisfactory condition to undergo the procedure. The                         anesthesia plan was to use general anesthesia.                         Immediately prior to administration of medications,                         the patient was re-assessed for adequacy to receive                         sedatives. The heart rate, respiratory rate, oxygen                         saturations, blood pressure, adequacy of pulmonary                         ventilation, and response to care were monitored                         throughout the procedure. The physical status of the                         patient was re-assessed after the procedure.                        After obtaining informed consent, the colonoscope was                         passed under direct vision. Throughout the procedure,                         the patient's blood pressure, pulse, and oxygen                         saturations were monitored continuously. The                         Colonoscope was introduced through the anus and  advanced to the the cecum, identified by appendiceal                         orifice and ileocecal valve. The colonoscopy was                         performed with difficulty due to significant looping                         and the patient's body habitus. Successful completion                         of the procedure was aided by applying abdominal                         pressure. The patient tolerated the procedure well.                         The quality of the bowel preparation was adequate. Findings:      The perianal and digital rectal examinations were normal. Pertinent       negatives include  normal sphincter tone and no palpable rectal lesions.      Three sessile polyps were found in the ascending colon. The polyps were       3 to 6 mm in size. These polyps were removed with a cold snare.       Resection and retrieval were complete.      Normal mucosa was found in the left colon and in the right colon.       Biopsies for histology were taken with a cold forceps from the entire       colon for evaluation of microscopic colitis.      The retroflexed view of the distal rectum and anal verge was normal and       showed no anal or rectal abnormalities. Impression:            - Three 3 to 6 mm polyps in the ascending colon,                         removed with a cold snare. Resected and retrieved.                        - Normal mucosa in the left colon and in the right                         colon. Biopsied.                        - The distal rectum and anal verge are normal on                         retroflexion view. Recommendation:        - Discharge patient to home (with escort).                        - Resume previous diet today.                        - Continue present medications.                        -  Await pathology results.                        - Return to my office as previously scheduled. Procedure Code(s):     --- Professional ---                        [phone], Colonoscopy, flexible; with removal of                         tumor(s), polyp(s), or other lesion(s) by snare                         technique                        [id number], 89, Colonoscopy, flexible; with biopsy, single                         or multiple Diagnosis Code(s):     --- Professional ---                        K63.5, Polyp of colon                        K52.9, Noninfective gastroenteritis and colitis,                         unspecified CPT copyright 2019 American Medical Association. All rights reserved. The codes documented in this report are preliminary and upon coder review may   be revised to meet current compliance requirements. Dr. Ulyess Mort [name] MD, MD 05/27/2020 12:42:41 PM This report has been signed electronically. Number of Addenda: 0 Note Initiated On: 05/27/2020 11:55 AM Scope Withdrawal Time: 0 hours 17 minutes 57 seconds  Total Procedure Duration: 0 hours 25 minutes 33 seconds  Estimated Blood Loss:  Estimated blood loss: none.      [facility]

## 2020-05-27 NOTE — Anesthesia Preprocedure Evaluation (Signed)
Anesthesia Evaluation  Patient identified by MRN, date of birth, ID band Patient awake    Reviewed: Allergy & Precautions, NPO status , Patient's Chart, lab work & pertinent test results  Airway Mallampati: III       Dental  (+) Caps   Pulmonary sleep apnea and Continuous Positive Airway Pressure Ventilation ,    Pulmonary exam normal        Cardiovascular hypertension, + CAD, + Past MI and + Peripheral Vascular Disease  Normal cardiovascular exam     Neuro/Psych  Neuromuscular disease negative psych ROS   GI/Hepatic   Endo/Other  diabetes, Insulin Dependent  Renal/GU Renal InsufficiencyRenal disease  negative genitourinary   Musculoskeletal  (+) Arthritis , Osteoarthritis,    Abdominal Normal abdominal exam  (+)   Peds  Hematology negative hematology ROS (+)   Anesthesia Other Findings Past Medical History: No date: Chronic kidney disease No date: Coronary artery disease     Comment:  MI, pci to mid LAD 2014. LHC 80%OM, 60%RCA, 90% LAD No date: Diabetes mellitus without complication (HCC) No date: Hypertension No date: Sleep apnea  Reproductive/Obstetrics                             Anesthesia Physical Anesthesia Plan  ASA: III  Anesthesia Plan: General   Post-op Pain Management:    Induction: Intravenous  PONV Risk Score and Plan:   Airway Management Planned: Nasal Cannula  Additional Equipment:   Intra-op Plan:   Post-operative Plan:   Informed Consent: I have reviewed the patients History and Physical, chart, labs and discussed the procedure including the risks, benefits and alternatives for the proposed anesthesia with the patient or authorized representative who has indicated his/her understanding and acceptance.     Dental advisory given  Plan Discussed with: CRNA and Surgeon  Anesthesia Plan Comments:         Anesthesia Quick Evaluation

## 2020-05-28 ENCOUNTER — Encounter: Payer: Self-pay | Admitting: Gastroenterology

## 2020-05-28 LAB — SURGICAL PATHOLOGY

## 2020-05-31 ENCOUNTER — Telehealth: Payer: Self-pay | Admitting: Internal Medicine

## 2020-05-31 NOTE — Telephone Encounter (Signed)
Faxed to [employer] for request for supporting documentation on CPAP/BIPAP device faxed on 05-31-20

## 2020-06-01 NOTE — Anesthesia Postprocedure Evaluation (Signed)
Anesthesia Post Note  Patient: Vickie Kemp  Procedure(s) Performed: COLONOSCOPY WITH PROPOFOL (N/A )  Patient location during evaluation: Endoscopy Anesthesia Type: General Level of consciousness: awake and alert and oriented Pain management: pain level controlled Vital Signs Assessment: post-procedure vital signs reviewed and stable Respiratory status: spontaneous breathing Cardiovascular status: blood pressure returned to baseline Anesthetic complications: no   No complications documented.   Last Vitals:  Vitals:   05/27/20 1308 05/27/20 1318  BP: 118/66 (!) 173/76  Pulse:    Resp:    Temp:    SpO2:      Last Pain:  Vitals:   05/27/20 1318  TempSrc:   PainSc: 0-No pain                 Faryn Sieg

## 2020-06-02 ENCOUNTER — Other Ambulatory Visit: Payer: Self-pay

## 2020-06-02 ENCOUNTER — Other Ambulatory Visit
Admission: RE | Admit: 2020-06-02 | Discharge: 2020-06-02 | Disposition: A | Discharge status: Home / Self Care | Payer: Medicare Other | Attending: Cardiology | Admitting: Cardiology

## 2020-06-02 DIAGNOSIS — I10 Essential (primary) hypertension: Secondary | ICD-10-CM | POA: Diagnosis not present

## 2020-06-02 LAB — BASIC METABOLIC PANEL
Anion gap: 9 (ref 5–15)
BUN: 26 mg/dL — ABNORMAL HIGH (ref 8–23)
CO2: 29 mmol/L (ref 22–32)
Calcium: 9.2 mg/dL (ref 8.9–10.3)
Chloride: 104 mmol/L (ref 98–111)
Creatinine, Ser: 1.6 mg/dL — ABNORMAL HIGH (ref 0.44–1.00)
GFR, Estimated: 31 mL/min — ABNORMAL LOW (ref >60)
Glucose, Bld: 130 mg/dL — ABNORMAL HIGH (ref 70–99)
Potassium: 4.4 mmol/L (ref 3.5–5.1)
Sodium: 142 mmol/L (ref 135–145)

## 2020-06-04 ENCOUNTER — Encounter: Payer: Self-pay | Admitting: Cardiology

## 2020-06-04 ENCOUNTER — Ambulatory Visit: Payer: Medicare Other | Admitting: Cardiology

## 2020-06-04 ENCOUNTER — Other Ambulatory Visit: Payer: Self-pay

## 2020-06-04 VITALS — BP 136/72 | HR 67 | Ht 64.0 in | Wt 190.0 lb

## 2020-06-04 DIAGNOSIS — I10 Essential (primary) hypertension: Secondary | ICD-10-CM | POA: Diagnosis not present

## 2020-06-04 DIAGNOSIS — I272 Pulmonary hypertension, unspecified: Secondary | ICD-10-CM

## 2020-06-04 DIAGNOSIS — I2510 Atherosclerotic heart disease of native coronary artery without angina pectoris: Secondary | ICD-10-CM | POA: Diagnosis not present

## 2020-06-04 DIAGNOSIS — I5189 Other ill-defined heart diseases: Secondary | ICD-10-CM

## 2020-06-04 NOTE — Patient Instructions (Signed)
Medication Instructions:  Your physician recommends that you continue on your current medications as directed. Please refer to the Current Medication list given to you today.  *If you need a refill on your cardiac medications before your next appointment, please call your pharmacy*   Lab Work: None ordered If you have labs (blood work) drawn today and your tests are completely normal, you will receive your results only by: . MyChart Message (if you have MyChart) OR . A paper copy in the mail If you have any lab test that is abnormal or we need to change your treatment, we will call you to review the results.   Testing/Procedures: None ordered   Follow-Up: At [facility], you and your health needs are our priority.  As part of our continuing mission to provide you with exceptional heart care, we have created designated Provider Care Teams.  These Care Teams include your primary Cardiologist (physician) and Advanced Practice Providers (APPs -  Physician Assistants and Nurse Practitioners) who all work together to provide you with the care you need, when you need it.  We recommend signing up for the patient portal called "MyChart".  Sign up information is provided on this After Visit Summary.  MyChart is used to connect with patients for Virtual Visits (Telemedicine).  Patients are able to view lab/test results, encounter notes, upcoming appointments, etc.  Non-urgent messages can be sent to your provider as well.   To learn more about what you can do with MyChart, go to [email or web address].    Your next appointment:   Your physician wants you to follow-up in: 6 months You will receive a reminder letter in the mail two months in advance. If you don't receive a letter, please call our office to schedule the follow-up appointment.   The format for your next appointment:   In Person  Provider:   You may see Brian Agbor-Etang, MD or one of the following Advanced Practice Providers on  your designated Care Team:    Christopher Berge, NP  Ryan Dunn, PA-C  Jacquelyn Visser, PA-C  Cadence Furth, PA-C    Other Instructions N/A  

## 2020-06-04 NOTE — Progress Notes (Signed)
Cardiology Office Note:    Date:  06/04/2020   ID:  Vickie Kemp, DOB Dec 17, 1940, MRN [id number]  PCP:  McLean-Scocuzza, Nino Glow, MD  Cardiologist:  Kate Sable, MD  Electrophysiologist:  None   Referring MD: McLean-Scocuzza, Olivia Mackie *   Chief Complaint  Patient presents with  . Follow-up    1 month  Pt states she is feeling lightheaded this morning---does not happen often. No other Sx.    History of Present Illness:    Vickie Kemp is a 79 y.o. female with a hx of hypertension, CKD, OSA, obesity, CAD/MI (status post DES to mLAD 04/2013 at [facility] Takes as Lasix as prescribed for diastolic dysfunction and edema. Edema has been about the same or slightly better. Had a recent kidney function test 2 days ago showing stable creatinine. Has appointment coming up with nephrology soon. Denies chest pain or shortness of breath.    Prior notes Echocardiogram on 06/2019 showed normal systolic function with EF 60 to 65%, grade 1 diastolic dysfunction, mild to moderate MR. Per records, angiogram in 2014 showed 90% mid LAD lesion, 80% OM, 60% RCA s/p DES to mid LAD 2014.     Past Medical History:  Diagnosis Date  . Chronic kidney disease   . Coronary artery disease    MI, pci to mid LAD 2014. LHC 80%OM, 60%RCA, 90% LAD  . Diabetes mellitus without complication ([place])   . Hypertension   . Sleep apnea     Past Surgical History:  Procedure Laterality Date  . ACNE CYST REMOVAL  2008   Groin area  . CARDIAC CATHETERIZATION    . COLONOSCOPY WITH PROPOFOL N/A 05/27/2020   Procedure: COLONOSCOPY WITH PROPOFOL;  Surgeon: [name], MD;  Location: [facility] ENDOSCOPY;  Service: Gastroenterology;  Laterality: N/A;  . CORONARY ANGIOPLASTY      Current Medications: Current Meds  Medication Sig  . acetaminophen (TYLENOL) 650 MG CR tablet Take 650 mg by mouth 3 (three) times daily as needed.  [name] amLODipine (NORVASC) 5 MG tablet Take 5  mg by mouth daily.  [name] aspirin EC 81 MG tablet Take 81 mg by mouth daily.  [name] atorvastatin (LIPITOR) 40 MG tablet Take 1 tablet (40 mg total) by mouth daily.  . carvedilol (COREG) 25 MG tablet Take 1 tablet (25 mg total) by mouth 2 (two) times daily.  . furosemide (LASIX) 40 MG tablet Take 1 tablet (40 mg total) by mouth in the morning and at bedtime.  . Glucose Blood (BLOOD GLUCOSE TEST STRIPS) STRP E11.9 check sugar tid for designated machine  . insulin detemir (LEVEMIR FLEXTOUCH) 100 UNIT/ML FlexPen 30 units in am and 30 units qhs with food  . insulin lispro (HUMALOG KWIKPEN) 100 UNIT/ML KwikPen Prn based on sugar If sugar 70-130 0 units If sugar 131-180 4 units If sugar 181-240 8 units If sugar 241-300 10 units If sugar 301-350 12 units If sugar 351-400 16 units If >400 20 units and call doctor  . Insulin Pen Needle (PEN NEEDLES) 31G X 8 MM MISC 1 pen by Does not apply route 2 (two) times daily as needed.  . Insulin Pen Needle 32G X 4 MM MISC 1 Device by Does not apply route 4 (four) times daily - after meals and at bedtime.  [name] latanoprost (XALATAN) 0.005 % ophthalmic solution Place 1 drop into the left eye at bedtime.  . Loperamide HCl (IMODIUM PO) Take by mouth.   . losartan (COZAAR) 100 MG  tablet Take 100 mg by mouth daily.  . Pancrelipase, Lip-Prot-Amyl, (ZENPEP PO) Take by mouth.   . Turmeric (QC TUMERIC COMPLEX PO) Take by mouth.   . [DISCONTINUED] amLODipine (NORVASC) 10 MG tablet Take 10 mg by mouth daily.     Allergies:   Amoxicillin   Social History   Socioeconomic History  . Marital status: Single    Spouse name: Not on file  . Number of children: Not on file  . Years of education: Not on file  . Highest education level: Not on file  Occupational History  . Not on file  Tobacco Use  . Smoking status: Never Smoker  . Smokeless tobacco: Never Used  Vaping Use  . Vaping Use: Never used  Substance and Sexual Activity  . Alcohol use: Never  . Drug use: Never  . Sexual  activity: Not on file  Other Topics Concern  . Not on file  Social History Narrative   Used to live in [place] Determinants of Health   Financial Resource Strain:   . Difficulty of Paying Living Expenses: Not on file  Food Insecurity:   . Worried About Charity fundraiser in the Last Year: Not on file  . Ran Out of Food in the Last Year: Not on file  Transportation Needs:   . Lack of Transportation (Medical): Not on file  . Lack of Transportation (Non-Medical): Not on file  Physical Activity:   . Days of Exercise per Week: Not on file  . Minutes of Exercise per Session: Not on file  Stress:   . Feeling of Stress : Not on file  Social Connections:   . Frequency of Communication with Friends and Family: Not on file  . Frequency of Social Gatherings with Friends and Family: Not on file  . Attends Religious Services: Not on file  . Active Member of Clubs or Organizations: Not on file  . Attends Archivist Meetings: Not on file  . Marital Status: Not on file     Family History: The patient's family history includes Diabetes in her mother; Heart disease in her father.  ROS:   Please see the history of present illness.     All other systems reviewed and are negative.  EKGs/Labs/Other Studies Reviewed:    The following studies were reviewed today: TTE 2019-07-13 1. Left ventricular ejection fraction, by visual estimation, is 60 to 65%. The left ventricle has normal function. There is no left ventricular hypertrophy.  2. Left ventricular diastolic parameters are consistent with Grade I diastolic dysfunction (impaired relaxation).  3. Global right ventricle has normal systolic function.The right ventricular size is normal. No increase in right ventricular wall thickness.  4. Left atrial size was mildly dilated.  5. Mild to moderate mitral valve regurgitation.  6. Tricuspid valve regurgitation mild-moderate.  7. The pulmonic valve was normal in  structure. Pulmonic valve regurgitation is not visualized.  8. Moderately elevated pulmonary artery systolic pressure.  9. The tricuspid regurgitant velocity is 3.28 m/s, and with an assumed right atrial pressure of 10 mmHg, the estimated right ventricular systolic pressure is moderately elevated at 53.0 mmHg.  EKG:  EKG is ordered today. Normal sinus rhythm, first-degree AV block.  Recent Labs: 01/15/2020: ALT 9 04/24/2020: B Natriuretic Peptide 79.0; Hemoglobin 12.0; Magnesium 1.7; Platelets 200 06/02/2020: BUN 26; Creatinine, Ser 1.60; Potassium 4.4; Sodium 142  Recent Lipid Panel    Component Value Date/Time  CHOL 83 10/15/2019 0913   TRIG 72.0 10/15/2019 0913   HDL 25.50 (L) 10/15/2019 0913   CHOLHDL 3 10/15/2019 0913   VLDL 14.4 10/15/2019 0913   LDLCALC 43 10/15/2019 0913    Physical Exam:    VS:  BP 136/72   Pulse 67   Ht 5\' 4"  (1.626 m)   Wt 190 lb (86.2 kg)   LMP  (LMP Unknown)   BMI 32.61 kg/m     Wt Readings from Last 3 Encounters:  06/04/20 190 lb (86.2 kg)  05/27/20 187 lb (84.8 kg)  05/14/20 189 lb (85.7 kg)     GEN:  Well nourished, well developed in no acute distress HEENT: Normal NECK: No JVD; No carotid bruits LYMPHATICS: No lymphadenopathy CARDIAC: RRR, no murmurs, rubs, gallops RESPIRATORY:  Clear to auscultation without rales, wheezing or rhonchi  ABDOMEN: Soft, non-tender, non-distended MUSCULOSKELETAL: 1-2+ pitting edema; No deformity  SKIN: Warm and dry NEUROLOGIC:  Alert and oriented x 3 PSYCHIATRIC:  Normal affect   ASSESSMENT:    1. Diastolic dysfunction   2. Pulmonary HTN ([place])   3. Coronary artery disease involving native coronary artery of native heart without angina pectoris   4. Hypertension, unspecified type    PLAN:    1. Patient with improved lower extremity edema, diastolic dysfunction. Creatinine stable at baseline. Continue Lasix 40 mg twice daily. Potassium okay. 2. History of severe pulmonary hypertension with PASP of  61mmHg.  Etiology  likely multifactorial in light of OSA via hypoxia pathway or diastolic dysfunction (LVDD-PH). Continue CPAP for OSA, Lasix. Patient not currently interested in [place] to evaluate possiblilty of [place].. 3. History of CAD status post PCI to mid LAD. Continue aspirin, Lipitor. 4. hx of hypertension. BP controlled. Losartan, Norvasc, Coreg.  Follow-up in 6 months  Total encounter time 40 minutes  Greater than 50% was spent in counseling and coordination of care with the patient   Medication Adjustments/Labs and Tests Ordered: Current medicines are reviewed at length with the patient today.  Concerns regarding medicines are outlined above.  Orders Placed This Encounter  Procedures  . EKG 12-Lead   No orders of the defined types were placed in this encounter.   Patient Instructions  Medication Instructions:  Your physician recommends that you continue on your current medications as directed. Please refer to the Current Medication list given to you today.  *If you need a refill on your cardiac medications before your next appointment, please call your pharmacy*   Lab Work: None ordered If you have labs (blood work) drawn today and your tests are completely normal, you will receive your results only by: [name] MyChart Message (if you have MyChart) OR . A paper copy in the mail If you have any lab test that is abnormal or we need to change your treatment, we will call you to review the results.   Testing/Procedures: None ordered   Follow-Up: At [facility], you and your health needs are our priority.  As part of our continuing mission to provide you with exceptional heart care, we have created designated Provider Care Teams.  These Care Teams include your primary Cardiologist (physician) and Advanced Practice Providers (APPs -  Physician Assistants and Nurse Practitioners) who all work together to provide you with the care you need, when you need it.  We recommend signing  up for the patient portal called "MyChart".  Sign up information is provided on this After Visit Summary.  MyChart is used to connect with patients for  Virtual Visits (Telemedicine).  Patients are able to view lab/test results, encounter notes, upcoming appointments, etc.  Non-urgent messages can be sent to your provider as well.   To learn more about what you can do with MyChart, go to [email or web address].    Your next appointment:   Your physician wants you to follow-up in: 6 months You will receive a reminder letter in the mail two months in advance. If you don't receive a letter, please call our office to schedule the follow-up appointment.   The format for your next appointment:   In Person  Provider:   You may see Kate Sable, MD or one of the following Advanced Practice Providers on your designated Care Team:    Murray Hodgkins, NP  Christell Faith, PA-C  Marrianne Mood, PA-C  Cadence Vancleave, [place]    Other Instructions N/A     Signed, Kate Sable, MD  06/04/2020 12:58 PM    [place]

## 2020-06-11 DIAGNOSIS — G4733 Obstructive sleep apnea (adult) (pediatric): Secondary | ICD-10-CM | POA: Diagnosis not present

## 2020-06-16 ENCOUNTER — Other Ambulatory Visit (INDEPENDENT_AMBULATORY_CARE_PROVIDER_SITE_OTHER): Payer: Medicare Other

## 2020-06-16 ENCOUNTER — Other Ambulatory Visit: Payer: Self-pay

## 2020-06-16 DIAGNOSIS — D72829 Elevated white blood cell count, unspecified: Secondary | ICD-10-CM | POA: Diagnosis not present

## 2020-06-16 DIAGNOSIS — I152 Hypertension secondary to endocrine disorders: Secondary | ICD-10-CM | POA: Diagnosis not present

## 2020-06-16 DIAGNOSIS — Z1329 Encounter for screening for other suspected endocrine disorder: Secondary | ICD-10-CM

## 2020-06-16 DIAGNOSIS — E1159 Type 2 diabetes mellitus with other circulatory complications: Secondary | ICD-10-CM

## 2020-06-16 LAB — CBC WITH DIFFERENTIAL/PLATELET
Basophils Absolute: 0.1 10*3/uL (ref 0.0–0.1)
Basophils Relative: 0.6 % (ref 0.0–3.0)
Eosinophils Absolute: 0.2 10*3/uL (ref 0.0–0.7)
Eosinophils Relative: 2.5 % (ref 0.0–5.0)
HCT: 38 % (ref 36.0–46.0)
Hemoglobin: 12.2 g/dL (ref 12.0–15.0)
Lymphocytes Relative: 32.7 % (ref 12.0–46.0)
Lymphs Abs: 2.7 10*3/uL (ref 0.7–4.0)
MCHC: 32.2 g/dL (ref 30.0–36.0)
MCV: 86.2 fl (ref 78.0–100.0)
Monocytes Absolute: 0.8 10*3/uL (ref 0.1–1.0)
Monocytes Relative: 9.6 % (ref 3.0–12.0)
Neutro Abs: 4.5 10*3/uL (ref 1.4–7.7)
Neutrophils Relative %: 54.6 % (ref 43.0–77.0)
Platelets: 202 10*3/uL (ref 150.0–400.0)
RBC: 4.41 Mil/uL (ref 3.87–5.11)
RDW: 14.8 % (ref 11.5–15.5)
WBC: 8.3 10*3/uL (ref 4.0–10.5)

## 2020-06-16 LAB — HEPATIC FUNCTION PANEL
ALT: 9 U/L (ref 0–35)
AST: 14 U/L (ref 0–37)
Albumin: 4.2 g/dL (ref 3.5–5.2)
Alkaline Phosphatase: 117 U/L (ref 39–117)
Bilirubin, Direct: 0.1 mg/dL (ref 0.0–0.3)
Total Bilirubin: 0.4 mg/dL (ref 0.2–1.2)
Total Protein: 7.5 g/dL (ref 6.0–8.3)

## 2020-06-16 LAB — LIPID PANEL
Cholesterol: 106 mg/dL (ref 0–200)
HDL: 29.5 mg/dL — ABNORMAL LOW (ref >39.00)
LDL Cholesterol: 62 mg/dL (ref 0–99)
NonHDL: 76.17
Total CHOL/HDL Ratio: 4
Triglycerides: 70 mg/dL (ref 0.0–149.0)
VLDL: 14 mg/dL (ref 0.0–40.0)

## 2020-06-16 LAB — HEMOGLOBIN A1C: Hgb A1c MFr Bld: 9.3 % — ABNORMAL HIGH (ref 4.6–6.5)

## 2020-06-16 LAB — MICROALBUMIN / CREATININE URINE RATIO
Creatinine,U: 84.7 mg/dL
Microalb Creat Ratio: 0.8 mg/g (ref 0.0–30.0)
Microalb, Ur: <0.7 mg/dL (ref 0.0–1.9)

## 2020-06-16 LAB — TSH: TSH: 2.12 u[IU]/mL (ref 0.35–4.50)

## 2020-06-18 ENCOUNTER — Ambulatory Visit: Payer: Medicare Other | Admitting: Pharmacist

## 2020-06-18 DIAGNOSIS — I25119 Atherosclerotic heart disease of native coronary artery with unspecified angina pectoris: Secondary | ICD-10-CM

## 2020-06-18 DIAGNOSIS — E1159 Type 2 diabetes mellitus with other circulatory complications: Secondary | ICD-10-CM

## 2020-06-18 DIAGNOSIS — E782 Mixed hyperlipidemia: Secondary | ICD-10-CM

## 2020-06-18 DIAGNOSIS — I10 Essential (primary) hypertension: Secondary | ICD-10-CM

## 2020-06-18 MED ORDER — TRESIBA FLEXTOUCH 200 UNIT/ML ~~LOC~~ SOPN: 56.0000 [IU] | PEN_INJECTOR | Freq: Every day | SUBCUTANEOUS | 3 refills | Status: DC

## 2020-06-18 NOTE — Chronic Care Management (AMB) (Signed)
Chronic Care Management   Note  06/18/2020 Name: Vickie Kemp MRN: [id number] DOB: 1940-12-21   Subjective:  Vickie Kemp is a 79 y.o. year old female who is a primary care patient of McLean-Scocuzza, Nino Glow, MD. The CCM team was consulted for assistance with chronic disease management and care coordination needs.     Contacted patient for initial medication access and medication management support  Vickie Kemp was given information about Chronic Care Management services today including:  1. CCM service includes personalized support from designated clinical staff supervised by her physician, including individualized plan of care and coordination with other care providers 2. 24/7 contact phone numbers for assistance for urgent and routine care needs. 3. Service will only be billed when office clinical staff spend 20 minutes or more in a month to coordinate care. 4. Only one practitioner may furnish and bill the service in a calendar month. 5. The patient may stop CCM services at any time (effective at the end of the month) by phone call to the office staff. 6. The patient will be responsible for cost sharing (co-pay) of up to 20% of the service fee (after annual deductible is met).  Patient agreed to services and verbal consent obtained.   Review of patient status, including review of consultants reports, laboratory and other test data, was performed as part of comprehensive evaluation and provision of chronic care management services.   SDOH (Social Determinants of Health) assessments and interventions performed:  SDOH Interventions     Most Recent Value  SDOH Interventions  Food Insecurity Interventions Intervention Not Indicated  Financial Strain Interventions Intervention Not Indicated       Objective:  Lab Results  Component Value Date   CREATININE 1.60 (H) 06/02/2020   CREATININE 1.55 (H) 04/24/2020   CREATININE 1.49 (H) 03/19/2020    Lab Results  Component  Value Date   HGBA1C 9.3 (H) 06/16/2020       Component Value Date/Time   CHOL 106 06/16/2020 0928   TRIG 70.0 06/16/2020 0928   HDL 29.50 (L) 06/16/2020 0928   CHOLHDL 4 06/16/2020 0928   VLDL 14.0 06/16/2020 0928   LDLCALC 62 06/16/2020 0928    Clinical ASCVD: Yes - hx MI The ASCVD Risk score Mikey Bussing DC Jr., et al., 2013) failed to calculate for the following reasons:   The valid total cholesterol range is 130 to 320 mg/dL   Unable to determine if patient is Non-Hispanic African American    BP Readings from Last 3 Encounters:  06/04/20 136/72  05/27/20 (!) 173/76  05/14/20 (!) 150/76    Allergies  Allergen Reactions  . Amoxicillin Itching    Medications Reviewed Today    Reviewed by De Hollingshead, RPH-CPP (Pharmacist) on 06/18/20 at [facility] List Status: <None>  Medication Order Taking? Sig Documenting Provider Last Dose Status Informant  acetaminophen (TYLENOL) 650 MG CR tablet [id number] Yes Take 650 mg by mouth 3 (three) times daily as needed. [provider] Taking Active   amLODipine (NORVASC) 5 MG tablet [id number] Yes Take 5 mg by mouth daily. [provider] Taking Active   aspirin EC 81 MG tablet [id number] Yes Take 81 mg by mouth daily. [provider] Taking Active   atorvastatin (LIPITOR) 40 MG tablet [id number] Yes Take 1 tablet (40 mg total) by mouth daily. Kate Sable, MD Taking Active   carvedilol (COREG) 25 MG tablet [id number] Yes Take 1 tablet (25 mg total) by mouth 2 (two) times daily. Kate Sable,  MD Taking Active   furosemide (LASIX) 40 MG tablet [id number] Yes Take 1 tablet (40 mg total) by mouth in the morning and at bedtime. Kate Sable, MD Taking Active   Glucose Blood (BLOOD GLUCOSE TEST STRIPS) STRP [id number] Yes E11.9 check sugar tid for designated machine McLean-Scocuzza, Nino Glow, MD Taking Active   insulin detemir (LEVEMIR FLEXTOUCH) 100 UNIT/ML FlexPen [id number] Yes 30 units in am and 30 units qhs  with food McLean-Scocuzza, Nino Glow, MD Taking Active   insulin lispro (HUMALOG KWIKPEN) 100 UNIT/ML KwikPen [id number] Yes Prn based on sugar If sugar 70-130 0 units If sugar 131-180 4 units If sugar 181-240 8 units If sugar 241-300 10 units If sugar 301-350 12 units If sugar 351-400 16 units If >400 20 units and call doctor McLean-Scocuzza, Nino Glow, MD Taking Active   Insulin Pen Needle (PEN NEEDLES) 31G X 8 MM MISC [id number] Yes 1 pen by Does not apply route 2 (two) times daily as needed. McLean-Scocuzza, Nino Glow, MD Taking Active   Insulin Pen Needle 32G X 4 MM MISC [id number] Yes 1 Device by Does not apply route 4 (four) times daily - after meals and at bedtime. McLean-Scocuzza, Nino Glow, MD Taking Active   latanoprost (XALATAN) 0.005 % ophthalmic solution [id number] Yes Place 1 drop into the left eye at bedtime. [provider] Taking Active        Patient not taking:      Discontinued 06/18/20 0912 (Completed Course)   losartan (COZAAR) 100 MG tablet [id number] Yes Take 100 mg by mouth daily. [provider] Taking Active         Discontinued 06/18/20 0912 (Completed Course)   Turmeric (QC TUMERIC COMPLEX PO) [id number] Yes Take by mouth.  [provider] Taking Active            Assessment:   Patient Care Plan: Diabetes Type 2 (Adult)    Problem Identified: Glycemic Management (Diabetes, Type 2)     Long-Range Goal: Glycemic Management Optimized   Start Date: 06/18/2020  Expected End Date: 09/18/2020  This Visit's Progress: On track  Priority: High  Note:   Objective:  . Diabetes: uncontrolled; complicated by chronic medical conditions including, CAD (MI w/ DES to mLAD 04/2013), PVD, HTN, T2DM) Lab Results  Component Value Date   HGBA1C 9.3 (H) 06/16/2020 .   Lab Results  Component Value Date   CREATININE 1.60 (H) 06/02/2020   CREATININE 1.55 (H) 04/24/2020   CREATININE 1.49 (H) 03/19/2020   . Most recent eGFR: ~31 mL/min . Current antihyperglycemic  regimen: Levemir 30 units BID, Humalog per sliding scale . Denies hypoglycemic symptoms, including dizziness, lightheadedness, shaking, sweating . Reports denies hyperglycemic symptoms, including polyuria, polydipsia, polyphagia, nocturia, blurred vision, neuropathy . Current daily patterns: Wakes up ~9 am;  o Breakfast: 10-10:15 am; historically cereal, but is trying a lactose free diet post colonoscopy; grits, sometimes pancakes w/ sugar free jam or sugar free syrup; sometimes wheat toast; coffee, tea, or apple juice o Lunch: ~2-3 pm: burger, leftovers, sometimes BLT, soup o Snack: apples, grapes, occasional banana; sometimes potato chips o Supper: sometimes baked chicken; sometimes meatloaf, meatballs and spaghetti; mac and cheese; sometimes salads; cabbage, strings, boiled potatoes, garlic bread o Dessert: has cut out ice cream, but sometimes jello- plans to change to sugar free; 1 piece of cake or pie, but this is few and far between. Sometimes fruits.  o Drinks: sugar free sodas; no sugar juice; notes that she doesn't drink as much water  as she should - ~24-36 oz daily  . Current exercise: none, limited by mobility . Current blood glucose readings: Fasting: 88-170; 2 hour post prandial: up to 300s . Cardiovascular risk reduction: o Current hypertensive regimen: amlodipine 5 mg daily QPM, losartan 100 mg QAM, carvedilol 25 mg BID, furosemide 40 mg BID; last BP at goal o Current hyperlipidemia regimen: atorvastatin 40 mg; LDL at goal <70 o Current antiplatelet regimen: ASA 81 mg daily   Current Barriers:  . Unable to independently focus on low carbohydrate diet . Unable to independently manage glucose readings  Pharmacist Clinical Goal(s):  [name] Over the next 90 days, patient will work with PharmD and primary care provider to address optimized glycemic regimen  Interventions: . Comprehensive medication review performed, medication list updated in electronic medical  record . Inter-disciplinary care team collaboration (see longitudinal plan of care) . Reviewed goal A1c, goal fasting, goal 2 hour post prandial glucose . Reviewed duration of DM, CKD limiting our treatment options to insulin. Could consider GLP1 given patient's report of having a difficult time regulating portion sizes, but will defer at this time d/t work up for diarrhea.  . Extensive discussion of dietary choices. Aeronautical engineer . Discussed differences in basal insulin pharmacokinetics. She will finish current Levemir supply, but then will switch to [place] U200. Start at 56 units daily (dose reduction d/t improved efficacy). Continue Humalog per sliding scale.  . Discussed CGM, mechanism, benefits. Patient interested. Will submit order for FreeStyle Libre 2 today.   Patient Goals/Self-Care Activities . Over the next 90 days, patient will:  -Check glucose QID, document, and provide at future appointments - Focus on medication adherence  - Focus on decreasing carbohydrate portion sizes and incorporating protein into each meal - take medications as prescribed - contact provider with any episodes of hypoglycemia - report any questions or concerns to provider   Follow Up Plan: Face to Face appointment with care management team member scheduled for: ~10 weeks            Plan: - F/u face to face after PCP appt on 08/22/19  [name], PharmD, [place], CPP Clinical Pharmacist [place] [phone]

## 2020-06-18 NOTE — Patient Instructions (Signed)
Vickie Kemp,   It was great talking to you today! I will be sending the order for the FreeStyle Libre 2 Continuous Glucose Monitor to [facility]. Be on the lookout for a call from them regarding the copay.   In the meantime, let's switch to [place] when you finish your Levemir supply. Stop Levemir, start [place] U200 56 units daily. We will likely need to adjust this dose moving forward.   Call me with any questions or concerns in the meantime!  [name], PharmD  [phone]  Visit Information Patient Care Plan: Diabetes Type 2 (Adult)    Problem Identified: Glycemic Management (Diabetes, Type 2)     Long-Range Goal: Glycemic Management Optimized   Start Date: 06/18/2020  Expected End Date: 09/18/2020  This Visit's Progress: On track  Priority: High  Note:   Objective:  . Diabetes: uncontrolled; complicated by chronic medical conditions including, CAD (MI w/ DES to mLAD 04/2013), PVD, HTN, T2DM) Lab Results  Component Value Date   HGBA1C 9.3 (H) 06/16/2020 .   Lab Results  Component Value Date   CREATININE 1.60 (H) 06/02/2020   CREATININE 1.55 (H) 04/24/2020   CREATININE 1.49 (H) 03/19/2020   . Most recent eGFR: ~31 mL/min . Current antihyperglycemic regimen: Levemir 30 units BID, Humalog per sliding scale . Denies hypoglycemic symptoms, including dizziness, lightheadedness, shaking, sweating . Reports denies hyperglycemic symptoms, including polyuria, polydipsia, polyphagia, nocturia, blurred vision, neuropathy . Current daily patterns: Wakes up ~9 am;  o Breakfast: 10-10:15 am; historically cereal, but is trying a lactose free diet post colonoscopy; grits, sometimes pancakes w/ sugar free jam or sugar free syrup; sometimes wheat toast; coffee, tea, or apple juice o Lunch: ~2-3 pm: burger, leftovers, sometimes BLT, soup o Snack: apples, grapes, occasional banana; sometimes potato chips o Supper: sometimes baked chicken; sometimes meatloaf, meatballs and  spaghetti; mac and cheese; sometimes salads; cabbage, strings, boiled potatoes, garlic bread o Dessert: has cut out ice cream, but sometimes jello- plans to change to sugar free; 1 piece of cake or pie, but this is few and far between. Sometimes fruits.  o Drinks: sugar free sodas; no sugar juice; notes that she doesn't drink as much water as she should - ~24-36 oz daily  . Current exercise: none, limited by mobility . Current blood glucose readings: Fasting: 88-170; 2 hour post prandial: up to 300s . Cardiovascular risk reduction: o Current hypertensive regimen: amlodipine 5 mg daily QPM, losartan 100 mg QAM, carvedilol 25 mg BID, furosemide 40 mg BID; last BP at goal o Current hyperlipidemia regimen: atorvastatin 40 mg; LDL at goal <70 o Current antiplatelet regimen: ASA 81 mg daily   Current Barriers:  . Unable to independently focus on low carbohydrate diet . Unable to independently manage glucose readings  Pharmacist Clinical Goal(s):  [name] Over the next 90 days, patient will work with PharmD and primary care provider to address optimized glycemic regimen  Interventions: . Comprehensive medication review performed, medication list updated in electronic medical record . Inter-disciplinary care team collaboration (see longitudinal plan of care) . Reviewed goal A1c, goal fasting, goal 2 hour post prandial glucose . Reviewed duration of DM, CKD limiting our treatment options to insulin. Could consider GLP1 given patient's report of having a difficult time regulating portion sizes, but will defer at this time d/t work up for diarrhea.  . Extensive discussion of dietary choices. Aeronautical engineer . Discussed differences in basal insulin pharmacokinetics. She will finish current Levemir supply, but then  will switch to [place] U200. Start at 56 units daily (dose reduction d/t improved efficacy). Continue Humalog per sliding scale.  . Discussed CGM, mechanism, benefits. Patient  interested. Will submit order for FreeStyle Libre 2 today.   Patient Goals/Self-Care Activities . Over the next 90 days, patient will:  -Check glucose QID, document, and provide at future appointments - Focus on medication adherence  - Focus on decreasing carbohydrate portion sizes and incorporating protein into each meal - take medications as prescribed - contact provider with any episodes of hypoglycemia - report any questions or concerns to provider   Follow Up Plan: Face to Face appointment with care management team member scheduled for: ~10 weeks             Goals Addressed            This Visit's Progress   . Monitor and Manage My Blood Sugar       Follow Up Date 08/19/2020    - check blood sugar at prescribed times - focus on reduced carbohydrate portion sizes         Vickie Kemp was given information about Chronic Care Management services today including:  1. CCM service includes personalized support from designated clinical staff supervised by her physician, including individualized plan of care and coordination with other care providers 2. 24/7 contact phone numbers for assistance for urgent and routine care needs. 3. Service will only be billed when office clinical staff spend 20 minutes or more in a month to coordinate care. 4. Only one practitioner may furnish and bill the service in a calendar month. 5. The patient may stop CCM services at any time (effective at the end of the month) by phone call to the office staff. 6. The patient will be responsible for cost sharing (co-pay) of up to 20% of the service fee (after annual deductible is met).  Patient agreed to services and verbal consent obtained.   The patient verbalized understanding of instructions provided today and agreed to receive a mailed copy of patient instruction and/or educational materials.  Plan: - F/u face to face after PCP appt on 08/22/19  [name], PharmD, [place], CPP Clinical  Pharmacist [place] [phone]

## 2020-06-21 DIAGNOSIS — I129 Hypertensive chronic kidney disease with stage 1 through stage 4 chronic kidney disease, or unspecified chronic kidney disease: Secondary | ICD-10-CM | POA: Diagnosis not present

## 2020-06-21 DIAGNOSIS — N1832 Chronic kidney disease, stage 3b: Secondary | ICD-10-CM | POA: Diagnosis not present

## 2020-06-21 DIAGNOSIS — E1122 Type 2 diabetes mellitus with diabetic chronic kidney disease: Secondary | ICD-10-CM | POA: Diagnosis not present

## 2020-06-21 DIAGNOSIS — N2581 Secondary hyperparathyroidism of renal origin: Secondary | ICD-10-CM | POA: Diagnosis not present

## 2020-07-12 DIAGNOSIS — G4733 Obstructive sleep apnea (adult) (pediatric): Secondary | ICD-10-CM | POA: Diagnosis not present

## 2020-07-15 ENCOUNTER — Encounter: Payer: Self-pay | Admitting: Internal Medicine

## 2020-07-15 DIAGNOSIS — G4733 Obstructive sleep apnea (adult) (pediatric): Secondary | ICD-10-CM | POA: Diagnosis not present

## 2020-07-20 ENCOUNTER — Ambulatory Visit (INDEPENDENT_AMBULATORY_CARE_PROVIDER_SITE_OTHER): Payer: Medicare Other | Admitting: Gastroenterology

## 2020-07-20 ENCOUNTER — Encounter: Payer: Self-pay | Admitting: Gastroenterology

## 2020-07-20 VITALS — BP 138/68 | HR 70 | Temp 97.6°F | Ht 64.0 in | Wt 189.4 lb

## 2020-07-20 DIAGNOSIS — K529 Noninfective gastroenteritis and colitis, unspecified: Secondary | ICD-10-CM

## 2020-07-20 MED ORDER — DICYCLOMINE HCL 10 MG PO CAPS: 10.0000 mg | ORAL_CAPSULE | Freq: Three times a day (TID) | ORAL | 0 refills | Status: DC

## 2020-07-20 NOTE — Progress Notes (Signed)
Cephas Darby, MD [address]  [place]  [place], [place] [address]  Main: [phone]  Fax: [phone]    Gastroenterology Consultation  Referring Provider:     McLean-Scocuzza, Olivia Mackie * Primary Care Physician:  McLean-Scocuzza, Nino Glow, MD Primary Gastroenterologist:  Dr. Cephas Darby Reason for Consultation:     Chronic diarrhea        HPI:   Vickie Kemp is a 79 y.o. female referred by Dr. Terese Door, Nino Glow, MD  for consultation & management of diarrhea.  Patient has history of metabolic syndrome, poorly controlled diabetes, HbA1c 9.9 is seen in consultation for nonbloody watery diarrhea that started since 01/2020. She states that she used to have regular bowel movements prior to onset of diarrhea.  1 day, she had a cheese cake followed by diarrhea which is still ongoing.  3-7 bowel movements throughout the day and sometimes at night.  She denies any fecal incontinence.  She denies abdominal pain and bloating.  Her diarrhea is mostly postprandial, in about 30 minutes after she eats.  Her only new medication is losartan that was started about a month ago.  Her weight has been stable.  She denies any rectal bleeding.  She underwent stool studies in 02/2020 which were negative for infection.  She states she has tried Imodium 2 times a day which did not help.  She does eat cheese every day and she follow a brat diet for 2 weeks which not help with her diarrhea.  Most recent labs revealed mild leukocytosis only. She does not smoke or drink alcohol  Follow-up visit 07/20/2020 Patient reports that her diarrhea has significantly improved.  Her stool studies were negative for infection, fecal calprotectin levels were mildly elevated, therefore underwent colonoscopy with biopsies, there was no endoscopic evidence of colitis.  Biopsies were normal.  Patient reports having semiformed to formed bowel movements 1-2 times daily.  She is not experiencing postprandial urgency or  cramps.  She notices that her stool consistency changes when she has cheese.  She does not have any other concerns today  Patient is wondering if her symptoms are related to stress as she was worried about her sister who lives in [place] due to her medical condition.  She visited her sister and now she is doing fine, patient feels much calmer and less stressful.  NSAIDs: None  Antiplts/Anticoagulants/Anti thrombotics: None  GI Procedures: Reports having had a colonoscopy several years ago  Colonoscopy 05/27/2020 - Three 3 to 6 mm polyps in the ascending colon, removed with a cold snare. Resected and retrieved. - Normal mucosa in the left colon and in the right colon. Biopsied. - The distal rectum and anal verge are normal on retroflexion view. DIAGNOSIS:  A. COLON POLYP X3, ASCENDING; COLD SNARE:  - TUBULAR ADENOMA, THREE FRAGMENTS.  - NEGATIVE FOR HIGH-GRADE DYSPLASIA AND MALIGNANCY.   B. COLON, RANDOM; COLD BIOPSY:  - COLONIC MUCOSA WITH NO SIGNIFICANT PATHOLOGIC ALTERATION.  - NEGATIVE FOR MICROSCOPIC COLITIS, DYSPLASIA, AND MALIGNANCY.   Past Medical History:  Diagnosis Date  . Chronic kidney disease   . Coronary artery disease    MI, pci to mid LAD 2014. LHC 80%OM, 60%RCA, 90% LAD  . Diabetes mellitus without complication ([place])   . Hypertension   . Sleep apnea     Past Surgical History:  Procedure Laterality Date  . ACNE CYST REMOVAL  2008   Groin area  . CARDIAC CATHETERIZATION    . COLONOSCOPY WITH PROPOFOL N/A  05/27/2020   Procedure: COLONOSCOPY WITH PROPOFOL;  Surgeon: [name], MD;  Location: [facility] ENDOSCOPY;  Service: Gastroenterology;  Laterality: N/A;  . CORONARY ANGIOPLASTY      Current Outpatient Medications:  .  acetaminophen (TYLENOL) 650 MG CR tablet, Take 650 mg by mouth 3 (three) times daily as needed., Disp: , Rfl:  .  amLODipine (NORVASC) 5 MG tablet, Take 5 mg by mouth daily., Disp: , Rfl:  .  aspirin EC 81 MG tablet, Take 81 mg by  mouth daily., Disp: , Rfl:  .  atorvastatin (LIPITOR) 40 MG tablet, Take 1 tablet (40 mg total) by mouth daily., Disp: 90 tablet, Rfl: 0 .  carvedilol (COREG) 25 MG tablet, Take 1 tablet (25 mg total) by mouth 2 (two) times daily., Disp: 60 tablet, Rfl: 3 .  furosemide (LASIX) 40 MG tablet, Take 1 tablet (40 mg total) by mouth in the morning and at bedtime., Disp: 60 tablet, Rfl: 3 .  Glucose Blood (BLOOD GLUCOSE TEST STRIPS) STRP, E11.9 check sugar tid for designated machine, Disp: 300 strip, Rfl: 5 .  insulin lispro (HUMALOG KWIKPEN) 100 UNIT/ML KwikPen, Prn based on sugar If sugar 70-130 0 units If sugar 131-180 4 units If sugar 181-240 8 units If sugar 241-300 10 units If sugar 301-350 12 units If sugar 351-400 16 units If >400 20 units and call doctor, Disp: 15 mL, Rfl: 11 .  Insulin Pen Needle (PEN NEEDLES) 31G X 8 MM MISC, 1 pen by Does not apply route 2 (two) times daily as needed., Disp: 100 each, Rfl: 3 .  Insulin Pen Needle 32G X 4 MM MISC, 1 Device by Does not apply route 4 (four) times daily - after meals and at bedtime., Disp: 360 each, Rfl: 3 .  latanoprost (XALATAN) 0.005 % ophthalmic solution, Place 1 drop into the left eye at bedtime., Disp: , Rfl:  .  losartan (COZAAR) 100 MG tablet, Take 100 mg by mouth daily., Disp: , Rfl:  .  Turmeric (QC TUMERIC COMPLEX PO), Take by mouth. , Disp: , Rfl:  .  dicyclomine (BENTYL) 10 MG capsule, Take 1 capsule (10 mg total) by mouth 4 (four) times daily -  before meals and at bedtime., Disp: 15 capsule, Rfl: 0 .  insulin degludec (TRESIBA FLEXTOUCH) 200 UNIT/ML FlexTouch Pen, Inject 56 Units into the skin daily. (Patient not taking: Reported on 07/20/2020), Disp: 30 mL, Rfl: 3   Family History  Problem Relation Age of Onset  . Diabetes Mother   . Heart disease Father      Social History   Tobacco Use  . Smoking status: Never Smoker  . Smokeless tobacco: Never Used  Vaping Use  . Vaping Use: Never used  Substance Use Topics  . Alcohol  use: Never  . Drug use: Never    Allergies as of 07/20/2020 - Review Complete 07/20/2020  Allergen Reaction Noted  . Amoxicillin Itching 04/28/2019    Review of Systems:    All systems reviewed and negative except where noted in HPI.   Physical Exam:  BP 138/68 (BP Location: Left Arm, Patient Position: Sitting, Cuff Size: Normal)   Pulse 70   Temp 97.6 F (36.4 C) (Oral)   Ht 5\' 4"  (1.626 m)   Wt 189 lb 6 oz (85.9 kg)   LMP  (LMP Unknown)   BMI 32.51 kg/m  No LMP recorded (lmp unknown). Patient is postmenopausal.  General:   Alert,  Well-developed, well-nourished, pleasant and cooperative in NAD Head:  Normocephalic and atraumatic. Eyes:  Sclera clear, no icterus.   Conjunctiva pink. Ears:  Normal auditory acuity. Nose:  No deformity, discharge, or lesions. Mouth:  No deformity or lesions,oropharynx pink & moist. Neck:  Supple; no masses or thyromegaly. Lungs:  Respirations even and unlabored.  Clear throughout to auscultation.   No wheezes, crackles, or rhonchi. No acute distress. Heart:  Regular rate and rhythm; no murmurs, clicks, rubs, or gallops. Abdomen:  Normal bowel sounds. Soft, non-tender and non-distended without masses, hepatosplenomegaly or hernias noted.  No guarding or rebound tenderness.   Rectal: Not performed Msk:  Symmetrical without gross deformities. Good, equal movement & strength bilaterally. Pulses:  Normal pulses noted. Extremities:  No clubbing or edema.  No cyanosis. Neurologic:  Alert and oriented x3;  grossly normal neurologically. Skin:  Intact without significant lesions or rashes. No jaundice. Psych:  Alert and cooperative. Normal mood and affect.  Imaging Studies: No abdominal imaging  Assessment and Plan:   Vickie Kemp is a 79 y.o. pleasant African-American female with, poorly controlled diabetes is seen in consultation for chronic nonbloody diarrhea  Chronic diarrhea, symptoms have significantly improved Likely triggered with  stress Stool studies negative for infection H. pylori stool antigen, pancreatic fecal elastase levels were normal, borderline elevated fecal calprotectin levels Colonoscopy with colon biopsies were unremarkable IBgard as needed, samples provided Bentyl 10 mg as needed  Follow up as needed   Cephas Darby, MD

## 2020-08-02 ENCOUNTER — Other Ambulatory Visit: Payer: Self-pay

## 2020-08-02 ENCOUNTER — Encounter (INDEPENDENT_AMBULATORY_CARE_PROVIDER_SITE_OTHER): Payer: Self-pay | Admitting: Vascular Surgery

## 2020-08-02 ENCOUNTER — Encounter: Payer: Self-pay | Admitting: Internal Medicine

## 2020-08-02 ENCOUNTER — Ambulatory Visit (INDEPENDENT_AMBULATORY_CARE_PROVIDER_SITE_OTHER): Payer: Medicare Other | Admitting: Vascular Surgery

## 2020-08-02 VITALS — BP 130/69 | HR 61 | Resp 16 | Wt 188.8 lb

## 2020-08-02 DIAGNOSIS — I890 Lymphedema, not elsewhere classified: Secondary | ICD-10-CM | POA: Diagnosis not present

## 2020-08-02 DIAGNOSIS — I25119 Atherosclerotic heart disease of native coronary artery with unspecified angina pectoris: Secondary | ICD-10-CM

## 2020-08-02 DIAGNOSIS — I872 Venous insufficiency (chronic) (peripheral): Secondary | ICD-10-CM | POA: Diagnosis not present

## 2020-08-02 DIAGNOSIS — I10 Essential (primary) hypertension: Secondary | ICD-10-CM | POA: Diagnosis not present

## 2020-08-02 DIAGNOSIS — E1159 Type 2 diabetes mellitus with other circulatory complications: Secondary | ICD-10-CM

## 2020-08-02 MED ORDER — CARVEDILOL 25 MG PO TABS: 25.0000 mg | ORAL_TABLET | Freq: Two times a day (BID) | ORAL | 3 refills | Status: DC

## 2020-08-02 NOTE — Progress Notes (Signed)
Pt's carvedilol (COREG) 25 MG tablet BID was refilled and sent to her pharmacy.

## 2020-08-02 NOTE — Progress Notes (Signed)
MRN : [id number]  Vickie Kemp is a 79 y.o. (Apr 29, 1941) female who presents with chief complaint of  Chief Complaint  Patient presents with  . Follow-up    31month follow up  .  History of Present Illness:  The patient returns to the office for followup evaluation regarding leg swelling.  She notes her left leg is much worse than her right leg.  The swelling has persisted and the pain associated with swelling continues. There have not been any interval development of a ulcerations or wounds.  Since the previous visit the patient has been wearing graduated compression stockings and has noted little if any improvement in the lymphedema. The patient has been using compression routinely morning until night.  The patient also states elevation during the day and exercise is being done too.  Previous duplex ultrasound of the venous system demonstrates reflux within both the deep and superficial venous system.   Current Meds  Medication Sig  . acetaminophen (TYLENOL) 650 MG CR tablet Take 650 mg by mouth 3 (three) times daily as needed.  [name] amLODipine (NORVASC) 5 MG tablet Take 5 mg by mouth daily.  [name] aspirin EC 81 MG tablet Take 81 mg by mouth daily.  [name] atorvastatin (LIPITOR) 40 MG tablet Take 1 tablet (40 mg total) by mouth daily.  [name] dicyclomine (BENTYL) 10 MG capsule Take 1 capsule (10 mg total) by mouth 4 (four) times daily -  before meals and at bedtime.  . furosemide (LASIX) 40 MG tablet Take 1 tablet (40 mg total) by mouth in the morning and at bedtime.  . Glucose Blood (BLOOD GLUCOSE TEST STRIPS) STRP E11.9 check sugar tid for designated machine  . insulin lispro (HUMALOG KWIKPEN) 100 UNIT/ML KwikPen Prn based on sugar If sugar 70-130 0 units If sugar 131-180 4 units If sugar 181-240 8 units If sugar 241-300 10 units If sugar 301-350 12 units If sugar 351-400 16 units If >400 20 units and call doctor  . Insulin Pen Needle (PEN NEEDLES) 31G X 8 MM MISC 1 pen by Does not apply route  2 (two) times daily as needed.  . Insulin Pen Needle 32G X 4 MM MISC 1 Device by Does not apply route 4 (four) times daily - after meals and at bedtime.  [name] latanoprost (XALATAN) 0.005 % ophthalmic solution Place 1 drop into the left eye at bedtime.  [name] losartan (COZAAR) 100 MG tablet Take 100 mg by mouth daily.  . Turmeric (QC TUMERIC COMPLEX PO) Take by mouth.     Past Medical History:  Diagnosis Date  . Chronic kidney disease   . Coronary artery disease    MI, pci to mid LAD 2014. LHC 80%OM, 60%RCA, 90% LAD  . Diabetes mellitus without complication ([place])   . Hypertension   . Sleep apnea     Past Surgical History:  Procedure Laterality Date  . ACNE CYST REMOVAL  2008   Groin area  . CARDIAC CATHETERIZATION    . COLONOSCOPY WITH PROPOFOL N/A 05/27/2020   Procedure: COLONOSCOPY WITH PROPOFOL;  Surgeon: [name], MD;  Location: [facility] ENDOSCOPY;  Service: Gastroenterology;  Laterality: N/A;  . CORONARY ANGIOPLASTY      Social History Social History   Tobacco Use  . Smoking status: Never Smoker  . Smokeless tobacco: Never Used  Vaping Use  . Vaping Use: Never used  Substance Use Topics  . Alcohol use: Never  . Drug use: Never    Family History Family History  Problem Relation Age of Onset  . Diabetes Mother   . Heart disease Father     Allergies  Allergen Reactions  . Amoxicillin Itching     REVIEW OF SYSTEMS (Negative unless checked)  Constitutional: [] Weight loss  [] Fever  [] Chills Cardiac: [] Chest pain   [] Chest pressure   [] Palpitations   [] Shortness of breath when laying flat   [] Shortness of breath with exertion. Vascular:  [] Pain in legs with walking   [x] Pain in legs at rest  [] History of DVT   [] Phlebitis   [x] Swelling in legs   [] Varicose veins   [] Non-healing ulcers Pulmonary:   [] Uses home oxygen   [] Productive cough   [] Hemoptysis   [] Wheeze  [] COPD   [] Asthma Neurologic:  [] Dizziness   [] Seizures   [] History of stroke   [] History of TIA   [] Aphasia   [] Vissual changes   [] Weakness or numbness in arm   [x] Weakness or numbness in leg Musculoskeletal:   [] Joint swelling   [x] Joint pain   [] Low back pain Hematologic:  [] Easy bruising  [] Easy bleeding   [] Hypercoagulable state   [] Anemic Gastrointestinal:  [] Diarrhea   [] Vomiting  [] Gastroesophageal reflux/heartburn   [] Difficulty swallowing. Genitourinary:  [] Chronic kidney disease   [] Difficult urination  [] Frequent urination   [] Blood in urine Skin:  [] Rashes   [] Ulcers  Psychological:  [] History of anxiety   []  History of major depression.  Physical Examination  Vitals:   08/02/20 1043  BP: 130/69  Pulse: 61  Resp: 16  Weight: 188 lb 12.8 oz (85.6 kg)   Body mass index is 32.41 kg/m. Gen: WD/WN, NAD Head: [place]/AT, No temporalis wasting.  Ear/Nose/Throat: Hearing grossly intact, nares w/o erythema or drainage Eyes: PER, EOMI, sclera nonicteric.  Neck: Supple, no large masses.   Pulmonary:  Good air movement, no audible wheezing bilaterally, no use of accessory muscles.  Cardiac: RRR, no JVD Vascular: scattered varicosities present bilaterally.  Moderate venous stasis changes to the legs bilaterally.  3-4+ soft pitting edema. Vessel Right Left  Radial Palpable Palpable  PT Palpable Palpable  DP Palpable Palpable  Gastrointestinal: Non-distended. No guarding/no peritoneal signs.  Musculoskeletal: M/S 5/5 throughout.  No deformity or atrophy.  Neurologic: CN 2-12 intact. Symmetrical.  Speech is fluent. Motor exam as listed above. Psychiatric: Judgment intact, Mood & affect appropriate for pt's clinical situation. Dermatologic: Venous rashes no ulcers noted.  No changes consistent with cellulitis. Lymph : + lichenification or skin changes of chronic lymphedema.  CBC Lab Results  Component Value Date   WBC 8.3 06/16/2020   HGB 12.2 06/16/2020   HCT 38.0 06/16/2020   MCV 86.2 06/16/2020   PLT 202.0 06/16/2020    BMET    Component Value Date/Time   NA 142  06/02/2020 1103   K 4.4 06/02/2020 1103   CL 104 06/02/2020 1103   CO2 29 06/02/2020 1103   GLUCOSE 130 (H) 06/02/2020 1103   BUN 26 (H) 06/02/2020 1103   CREATININE 1.60 (H) 06/02/2020 1103   CALCIUM 9.2 06/02/2020 1103   GFRNONAA 31 (L) 06/02/2020 1103   GFRAA 37 (L) 04/24/2020 1119   CrCl cannot be calculated (Patient's most recent lab result is older than the maximum 21 days allowed.).  COAG No results found for: INR, PROTIME  Radiology No results found.    Assessment/Plan 1. Chronic venous insufficiency Recommend:  No surgery or intervention at this point in time.    I have reviewed my previous discussion with the patient regarding swelling and why it causes  symptoms.  Patient will continue wearing graduated compression stockings class 1 (20-30 mmHg) on a daily basis. The patient will  beginning wearing the stockings first thing in the morning and removing them in the evening. The patient is instructed specifically not to sleep in the stockings.    In addition, behavioral modification including several periods of elevation of the lower extremities during the day will be continued.  This was reviewed with the patient during the initial visit.  The patient will also continue routine exercise, especially walking on a daily basis as was discussed during the initial visit.    Despite conservative treatments including graduated compression therapy class 1 and behavioral modification including exercise and elevation the patient  has not obtained adequate control of the lymphedema.  The patient still has stage 3 lymphedema and therefore, I believe that a lymph pump should be added to improve the control of the patient's lymphedema.  Additionally, a lymph pump is warranted because it will reduce the risk of cellulitis and ulceration in the future.  Patient should follow-up in six months    2. Lymphedema Recommend:  No surgery or intervention at this point in time.    I have  reviewed my previous discussion with the patient regarding swelling and why it causes symptoms.  Patient will continue wearing graduated compression stockings class 1 (20-30 mmHg) on a daily basis. The patient will  beginning wearing the stockings first thing in the morning and removing them in the evening. The patient is instructed specifically not to sleep in the stockings.    In addition, behavioral modification including several periods of elevation of the lower extremities during the day will be continued.  This was reviewed with the patient during the initial visit.  The patient will also continue routine exercise, especially walking on a daily basis as was discussed during the initial visit.    Despite conservative treatments including graduated compression therapy class 1 and behavioral modification including exercise and elevation the patient  has not obtained adequate control of the lymphedema.  The patient still has stage 3 lymphedema and therefore, I believe that a lymph pump should be added to improve the control of the patient's lymphedema.  Additionally, a lymph pump is warranted because it will reduce the risk of cellulitis and ulceration in the future.  Patient should follow-up in six months    3. Coronary artery disease involving native heart with angina pectoris, unspecified vessel or lesion type [facility]) Continue cardiac and antihypertensive medications as already ordered and reviewed, no changes at this time.  Continue statin as ordered and reviewed, no changes at this time  Nitrates PRN for chest pain   4. Essential (primary) hypertension Continue antihypertensive medications as already ordered, these medications have been reviewed and there are no changes at this time.   5. Type 2 diabetes mellitus with vascular disease ([place]) Continue hypoglycemic medications as already ordered, these medications have been reviewed and there are no changes at this time.  Hgb A1C to be  monitored as already arranged by primary service    Hortencia Pilar, MD  08/02/2020 11:06 AM

## 2020-08-05 ENCOUNTER — Telehealth: Payer: Self-pay

## 2020-08-05 NOTE — Telephone Encounter (Signed)
Called patient and confirmed that she is aware her Carvedilol refill has been sent in.

## 2020-08-11 DIAGNOSIS — G4733 Obstructive sleep apnea (adult) (pediatric): Secondary | ICD-10-CM | POA: Diagnosis not present

## 2020-08-13 ENCOUNTER — Encounter: Payer: Self-pay | Admitting: Internal Medicine

## 2020-08-16 DIAGNOSIS — G4733 Obstructive sleep apnea (adult) (pediatric): Secondary | ICD-10-CM | POA: Diagnosis not present

## 2020-08-17 ENCOUNTER — Telehealth: Payer: Medicare Other

## 2020-08-19 ENCOUNTER — Other Ambulatory Visit: Payer: Self-pay

## 2020-08-19 ENCOUNTER — Encounter: Payer: Self-pay | Admitting: Internal Medicine

## 2020-08-19 ENCOUNTER — Ambulatory Visit (INDEPENDENT_AMBULATORY_CARE_PROVIDER_SITE_OTHER): Payer: Medicare Other | Admitting: Internal Medicine

## 2020-08-19 ENCOUNTER — Ambulatory Visit: Payer: Medicare Other | Admitting: Pharmacist

## 2020-08-19 VITALS — BP 142/64 | HR 64 | Temp 98.1°F | Ht 64.0 in | Wt 186.2 lb

## 2020-08-19 DIAGNOSIS — E1159 Type 2 diabetes mellitus with other circulatory complications: Secondary | ICD-10-CM

## 2020-08-19 DIAGNOSIS — N1832 Chronic kidney disease, stage 3b: Secondary | ICD-10-CM | POA: Diagnosis not present

## 2020-08-19 DIAGNOSIS — I152 Hypertension secondary to endocrine disorders: Secondary | ICD-10-CM

## 2020-08-19 MED ORDER — TRESIBA FLEXTOUCH 200 UNIT/ML ~~LOC~~ SOPN: 50.0000 [IU] | PEN_INJECTOR | Freq: Every day | SUBCUTANEOUS | 3 refills | Status: DC

## 2020-08-19 NOTE — Patient Instructions (Addendum)
Start glucose monitor  Consider nutritionist visit  Consider glucose tablets if sugar drops <70    12/19/20 Office Visit [facility]  [address]  [place], [place] [address]  [phone]  [address]  [place], [place] [address]  [phone] (Work)  [phone] (Fax      Diabetes Mellitus and Nutrition, Adult When you have diabetes (diabetes mellitus), it is very important to have healthy eating habits because your blood sugar (glucose) levels are greatly affected by what you eat and drink. Eating healthy foods in the appropriate amounts, at about the same times every day, can help you:  Control your blood glucose.  Lower your risk of heart disease.  Improve your blood pressure.  Reach or maintain a healthy weight. Every person with diabetes is different, and each person has different needs for a meal plan. Your health care provider may recommend that you work with a diet and nutrition specialist (dietitian) to make a meal plan that is best for you. Your meal plan may vary depending on factors such as:  The calories you need.  The medicines you take.  Your weight.  Your blood glucose, blood pressure, and cholesterol levels.  Your activity level.  Other health conditions you have, such as heart or kidney disease. How do carbohydrates affect me? Carbohydrates, also called carbs, affect your blood glucose level more than any other type of food. Eating carbs naturally raises the amount of glucose in your blood. Carb counting is a method for keeping track of how many carbs you eat. Counting carbs is important to keep your blood glucose at a healthy level, especially if you use insulin or take certain oral diabetes medicines. It is important to know how many carbs you can safely have in each meal. This is different for every person. Your dietitian can help you calculate how many  carbs you should have at each meal and for each snack. Foods that contain carbs include:  Bread, cereal, rice, pasta, and crackers.  Potatoes and corn.  Peas, beans, and lentils.  Milk and yogurt.  Fruit and juice.  Desserts, such as cakes, cookies, ice cream, and candy. How does alcohol affect me? Alcohol can cause a sudden decrease in blood glucose (hypoglycemia), especially if you use insulin or take certain oral diabetes medicines. Hypoglycemia can be a life-threatening condition. Symptoms of hypoglycemia (sleepiness, dizziness, and confusion) are similar to symptoms of having too much alcohol. If your health care provider says that alcohol is safe for you, follow these guidelines:  Limit alcohol intake to no more than 1 drink per day for nonpregnant women and 2 drinks per day for men. One drink equals 12 oz of beer, 5 oz of wine, or 1 oz of hard liquor.  Do not drink on an empty stomach.  Keep yourself hydrated with water, diet soda, or unsweetened iced tea.  Keep in mind that regular soda, juice, and other mixers may contain a lot of sugar and must be counted as carbs. What are tips for following this plan?  Reading food labels  Start by checking the serving size on the "Nutrition Facts" label of packaged foods and drinks. The amount of calories, carbs, fats, and other nutrients listed on the label is based on one serving of the item. Many items contain more than one serving per package.  Check the total grams (g) of carbs in one serving. You can calculate  the number of servings of carbs in one serving by dividing the total carbs by 15. For example, if a food has 30 g of total carbs, it would be equal to 2 servings of carbs.  Check the number of grams (g) of saturated and trans fats in one serving. Choose foods that have low or no amount of these fats.  Check the number of milligrams (mg) of salt (sodium) in one serving. Most people should limit total sodium intake to less  than 2,300 mg per day.  Always check the nutrition information of foods labeled as "low-fat" or "nonfat". These foods may be higher in added sugar or refined carbs and should be avoided.  Talk to your dietitian to identify your daily goals for nutrients listed on the label. Shopping  Avoid buying canned, premade, or processed foods. These foods tend to be high in fat, sodium, and added sugar.  Shop around the outside edge of the grocery store. This includes fresh fruits and vegetables, bulk grains, fresh meats, and fresh dairy. Cooking  Use low-heat cooking methods, such as baking, instead of high-heat cooking methods like deep frying.  Cook using healthy oils, such as olive, canola, or sunflower oil.  Avoid cooking with butter, cream, or high-fat meats. Meal planning  Eat meals and snacks regularly, preferably at the same times every day. Avoid going long periods of time without eating.  Eat foods high in fiber, such as fresh fruits, vegetables, beans, and whole grains. Talk to your dietitian about how many servings of carbs you can eat at each meal.  Eat 4-6 ounces (oz) of lean protein each day, such as lean meat, chicken, fish, eggs, or tofu. One oz of lean protein is equal to: ? 1 oz of meat, chicken, or fish. ? 1 egg. ?  cup of tofu.  Eat some foods each day that contain healthy fats, such as avocado, nuts, seeds, and fish. Lifestyle  Check your blood glucose regularly.  Exercise regularly as told by your health care provider. This may include: ? 150 minutes of moderate-intensity or vigorous-intensity exercise each week. This could be brisk walking, biking, or water aerobics. ? Stretching and doing strength exercises, such as yoga or weightlifting, at least 2 times a week.  Take medicines as told by your health care provider.  Do not use any products that contain nicotine or tobacco, such as cigarettes and e-cigarettes. If you need help quitting, ask your health care  provider.  Work with a Veterinary surgeon or diabetes educator to identify strategies to manage stress and any emotional and social challenges. Questions to ask a health care provider  Do I need to meet with a diabetes educator?  Do I need to meet with a dietitian?  What number can I call if I have questions?  When are the best times to check my blood glucose? Where to find more information:  American Diabetes Association: diabetes.org  Academy of Nutrition and Dietetics: www.eatright.[employer] of Diabetes and Digestive and Kidney Diseases (NIH): CarFli[employer].tn Summary  A healthy meal plan will help you control your blood glucose and maintain a healthy lifestyle.  Working with a diet and nutrition specialist (dietitian) can help you make a meal plan that is best for you.  Keep in mind that carbohydrates (carbs) and alcohol have immediate effects on your blood glucose levels. It is important to count carbs and to use alcohol carefully. This information is not intended to replace advice given to you by your health  care provider. Make sure you discuss any questions you have with your health care provider. Document Revised: 07/13/2017 Document Reviewed: 09/04/2016 Elsevier Patient Education  [place].  Hypoglycemia Hypoglycemia occurs when the level of sugar (glucose) in the blood is too low. Hypoglycemia can happen in people who do or do not have diabetes. It can develop quickly, and it can be a medical emergency. For most people with diabetes, a blood glucose level below 70 mg/dL (3.9 mmol/L) is considered hypoglycemia. Glucose is a type of sugar that provides the body's main source of energy. Certain hormones (insulin and glucagon) control the level of glucose in the blood. Insulin lowers blood glucose, and glucagon raises blood glucose. Hypoglycemia can result from having too much insulin in the bloodstream, or from not eating enough food that contains glucose. You  may also have reactive hypoglycemia, which happens within 4 hours after eating a meal. What are the causes? Hypoglycemia occurs most often in people who have diabetes and may be caused by:  Diabetes medicine.  Not eating enough, or not eating often enough.  Increased physical activity.  Drinking alcohol on an empty stomach. If you do not have diabetes, hypoglycemia may be caused by:  A tumor in the pancreas.  Not eating enough, or not eating for long periods at a time (fasting).  A severe infection or illness.  Certain medicines. What increases the risk? Hypoglycemia is more likely to develop in:  People who have diabetes and take medicines to lower blood glucose.  People who abuse alcohol.  People who have a severe illness. What are the signs or symptoms? Mild symptoms Mild hypoglycemia may not cause any symptoms. If you do have symptoms, they may include:  Hunger.  Anxiety.  Sweating and feeling clammy.  Dizziness or feeling light-headed.  Sleepiness.  Nausea.  Increased heart rate.  Headache.  Blurry vision.  Irritability.  Tingling or numbness around the mouth, lips, or tongue.  A change in coordination.  Restless sleep. Moderate symptoms Moderate hypoglycemia can cause:  Mental confusion and poor judgment.  Behavior changes.  Weakness.  Irregular heartbeat. Severe symptoms Severe hypoglycemia is a medical emergency. It can cause:  Fainting.  Seizures.  Loss of consciousness (coma).  Death. How is this diagnosed? Hypoglycemia is diagnosed with a blood test to measure your blood glucose level. This blood test is done while you are having symptoms. Your health care provider may also do a physical exam and review your medical history. How is this treated? This condition can often be treated by immediately eating or drinking something that contains sugar, such as:  Fruit juice, 4-6 oz (120-150 mL).  Regular soda (not diet soda), 4-6  oz (120-150 mL).  Low-fat milk, 4 oz (120 mL).  Several pieces of hard candy.  Sugar or honey, 1 Tbsp (15 mL). Treating hypoglycemia if you have diabetes If you are alert and able to swallow safely, follow the 15:15 rule:  Take 15 grams of a rapid-acting carbohydrate. Talk with your health care provider about how much you should take.  Rapid-acting options include: ? Glucose pills (take 15 grams). ? 6-8 pieces of hard candy. ? 4-6 oz (120-150 mL) of fruit juice. ? 4-6 oz (120-150 mL) of regular (not diet) soda. ? 1 Tbsp (15 mL) honey or sugar.  Check your blood glucose 15 minutes after you take the carbohydrate.  If the repeat blood glucose level is still at or below 70 mg/dL (3.9 mmol/L), take 15 grams of a  carbohydrate again.  If your blood glucose level does not increase above 70 mg/dL (3.9 mmol/L) after 3 tries, seek emergency medical care.  After your blood glucose level returns to normal, eat a meal or a snack within 1 hour.  Treating severe hypoglycemia Severe hypoglycemia is when your blood glucose level is at or below 54 mg/dL (3 mmol/L). Severe hypoglycemia is a medical emergency. Get medical help right away. If you have severe hypoglycemia and you cannot eat or drink, you may need an injection of glucagon. A family member or close friend should learn how to check your blood glucose and how to give you a glucagon injection. Ask your health care provider if you need to have an emergency glucagon injection kit available. Severe hypoglycemia may need to be treated in a hospital. The treatment may include getting glucose through an IV. You may also need treatment for the cause of your hypoglycemia. Follow these instructions at home:  General instructions  Take over-the-counter and prescription medicines only as told by your health care provider.  Monitor your blood glucose as told by your health care provider.  Limit alcohol intake to no more than 1 drink a day for  nonpregnant women and 2 drinks a day for men. One drink equals 12 oz of beer (355 mL), 5 oz of wine (148 mL), or 1 oz of hard liquor (44 mL).  Keep all follow-up visits as told by your health care provider. This is important. If you have diabetes:  Always have a rapid-acting carbohydrate snack with you to treat low blood glucose.  Follow your diabetes management plan as directed. Make sure you: ? Know the symptoms of hypoglycemia. It is important to treat it right away to prevent it from becoming severe. ? Take your medicines as directed. ? Follow your exercise plan. ? Follow your meal plan. Eat on time, and do not skip meals. ? Check your blood glucose as often as directed. Always check before and after exercise. ? Follow your sick day plan whenever you cannot eat or drink normally. Make this plan in advance with your health care provider.  Share your diabetes management plan with people in your workplace, school, and household.  Check your urine for ketones when you are ill and as told by your health care provider.  Carry a medical alert card or wear medical alert jewelry. Contact a health care provider if:  You have problems keeping your blood glucose in your target range.  You have frequent episodes of hypoglycemia. Get help right away if:  You continue to have hypoglycemia symptoms after eating or drinking something containing glucose.  Your blood glucose is at or below 54 mg/dL (3 mmol/L).  You have a seizure.  You faint. These symptoms may represent a serious problem that is an emergency. Do not wait to see if the symptoms will go away. Get medical help right away. Call your local emergency services (911 in the [place] Summary  Hypoglycemia occurs when the level of sugar (glucose) in the blood is too low.  Hypoglycemia can happen in people who do or do not have diabetes. It can develop quickly, and it can be a medical emergency.  Make sure you know the symptoms of  hypoglycemia and how to treat it.  Always have a rapid-acting carbohydrate snack with you to treat low blood sugar. This information is not intended to replace advice given to you by your health care provider. Make sure you discuss any questions you have  with your health care provider. Document Revised: 01/21/2018 Document Reviewed: 09/03/2015 Elsevier Patient Education  2020 [employer].

## 2020-08-19 NOTE — Chronic Care Management (AMB) (Signed)
Chronic Care Management   Pharmacy Note  08/19/2020 Name: Vickie Kemp MRN: [id number] DOB: 1941/01/06  Subjective:  Vickie Kemp is a 80 y.o. year old female who is a primary care patient of McLean-Scocuzza, Vickie Glow, MD. The CCM team was consulted for assistance with chronic disease management and care coordination needs.    Engaged with patient face to face for follow up visit in response to provider referral for pharmacy case management and/or care coordination services.   Consent to Services:  Patient was given information about Chronic Care Management services, agreed to services, and gave verbal consent prior to initiation of services on 06/18/20. Please see initial visit note for detailed documentation.   Objective:  Lab Results  Component Value Date   CREATININE 1.60 (H) 06/02/2020   CREATININE 1.55 (H) 04/24/2020   CREATININE 1.49 (H) 03/19/2020    Lab Results  Component Value Date   HGBA1C 9.3 (H) 06/16/2020       Component Value Date/Time   CHOL 106 06/16/2020 0928   TRIG 70.0 06/16/2020 0928   HDL 29.50 (L) 06/16/2020 0928   CHOLHDL 4 06/16/2020 0928   VLDL 14.0 06/16/2020 0928   LDLCALC 62 06/16/2020 0928    BP Readings from Last 3 Encounters:  08/19/20 (!) 142/64  08/02/20 130/69  07/20/20 138/68    Assessment/Interventions: Review of patient past medical history, allergies, medications, health status, including review of consultants reports, laboratory and other test data, was performed as part of comprehensive evaluation and provision of chronic care management services.   SDOH (Social Determinants of Health) assessments and interventions performed:  SDOH Interventions   Flowsheet Row Most Recent Value  SDOH Interventions   Financial Strain Interventions Intervention Not Indicated       CCM Care Plan  Allergies  Allergen Reactions  . Amoxicillin Itching    Medications Reviewed Today    Reviewed by McLean-Scocuzza, Vickie Glow, MD  (Physician) on 08/19/20 at [place] List Status: <None>  Medication Order Taking? Sig Documenting Provider Last Dose Status Informant  acetaminophen (TYLENOL) 650 MG CR tablet [id number] No Take 650 mg by mouth 3 (three) times daily as needed. [provider] Taking Active   amLODipine (NORVASC) 5 MG tablet [id number] No Take 5 mg by mouth daily. [provider] Taking Active   aspirin EC 81 MG tablet [id number] No Take 81 mg by mouth daily. [provider] Taking Active   atorvastatin (LIPITOR) 40 MG tablet [id number] No Take 1 tablet (40 mg total) by mouth daily. Kate Sable, MD Taking Active   carvedilol (COREG) 25 MG tablet [id number]  Take 1 tablet (25 mg total) by mouth 2 (two) times daily. Kate Sable, MD  Active   dicyclomine (BENTYL) 10 MG capsule [id number] No Take 1 capsule (10 mg total) by mouth 4 (four) times daily -  before meals and at bedtime. [name], MD Taking Active   furosemide (LASIX) 40 MG tablet [id number] No Take 1 tablet (40 mg total) by mouth in the morning and at bedtime. Kate Sable, MD Taking Active   Glucose Blood (BLOOD GLUCOSE TEST STRIPS) STRP [id number] No E11.9 check sugar tid for designated machine McLean-Scocuzza, Vickie Glow, MD Taking Active   insulin degludec (TRESIBA FLEXTOUCH) 200 UNIT/ML FlexTouch Pen [id number] No Inject 56 Units into the skin daily.  Patient not taking: No sig reported   McLean-Scocuzza, Vickie Glow, MD Not Taking Active   insulin lispro (HUMALOG KWIKPEN) 100 UNIT/ML KwikPen [id number] No Prn based on sugar  If sugar 70-130 0 units If sugar 131-180 4 units If sugar 181-240 8 units If sugar 241-300 10 units If sugar 301-350 12 units If sugar 351-400 16 units If >400 20 units and call doctor McLean-Scocuzza, Vickie Glow, MD Taking Active   Insulin Pen Needle (PEN NEEDLES) 31G X 8 MM MISC [id number] No 1 pen by Does not apply route 2 (two) times daily as needed. McLean-Scocuzza, Vickie Glow, MD Taking Active    Insulin Pen Needle 32G X 4 MM MISC [id number] No 1 Device by Does not apply route 4 (four) times daily - after meals and at bedtime. McLean-Scocuzza, Vickie Glow, MD Taking Active   latanoprost (XALATAN) 0.005 % ophthalmic solution [id number] No Place 1 drop into the left eye at bedtime. [provider] Taking Active   losartan (COZAAR) 100 MG tablet [id number] No Take 100 mg by mouth daily. [provider] Taking Active   Turmeric (QC TUMERIC COMPLEX PO) [id number] No Take by mouth.  [provider] Taking Active           Patient Active Problem List   Diagnosis Date Noted  . Chronic venous insufficiency 08/02/2020  . Chronic diarrhea of unknown origin   . Lymphedema 05/18/2020  . Benign hypertensive kidney disease with chronic kidney disease 05/03/2020  . Chronic kidney disease, stage IV (severe) ([place]) 05/03/2020  . Diabetes mellitus ([place]) 05/03/2020  . OSA on CPAP 02/29/2020  . Hypertension associated with diabetes ([place]) 01/20/2020  . Memory loss 01/20/2020  . Tremor 01/20/2020  . Essential hypertension 01/20/2020  . Left lumbar radiculopathy 12/26/2019  . At risk for obstructive sleep apnea 11/21/2019  . Allergic rhinitis 11/21/2019  . Cystitis 10/17/2019  . PVD (peripheral vascular disease) ([place]) 10/02/2019  . Stage 3b chronic kidney disease ([place]) 09/30/2019  . Lumbar radiculopathy 09/30/2019  . Cervicalgia 09/30/2019  . CKD (chronic kidney disease) stage 4, GFR 15-29 ml/min (HCC) 09/30/2019  . Arthritis 09/30/2019  . Chronic maxillary sinusitis 09/30/2019  . Venous stasis 08/28/2019  . Secondary hyperparathyroidism of renal origin ([place]) 07/29/2019  . Type 2 diabetes mellitus with diabetic chronic kidney disease ([place]) 06/10/2019  . Pain due to onychomycosis of toenails of both feet 05/01/2019  . Type 2 diabetes mellitus with vascular disease ([place]) 05/01/2019  . Chronic arthropathy 05/01/2019  . Obesity 01/21/2018  . CAD (coronary artery disease)  06/04/2017  . Hyperlipemia 06/04/2017  . Insulin dependent diabetes mellitus 06/04/2017  . S/P PTCA (percutaneous transluminal coronary angioplasty) 06/04/2017  . Palpitations 06/04/2017  . Essential (primary) hypertension 06/04/2015    Conditions to be addressed/monitored: HTN, HLD and DM  Patient Care Plan: Medication Management    Problem Identified: Diabetes, CAD, PVD, HTN     Long-Range Goal: Disease Progression Prevention   Start Date: 06/18/2020  Expected End Date: 09/18/2020  This Visit's Progress: On track  Recent Progress: On track  Priority: High  Note:   Current Barriers:  . Unable to achieve control of diabetes  Pharmacist Clinical Goal(s):  [name] Over the next 90 days, patient will improve control of diabetes through collaboration with PharmD and provider.   Interventions: . 1:1 collaboration with McLean-Scocuzza, Vickie Glow, MD regarding development and update of comprehensive plan of care as evidenced by provider attestation and co-signature . Inter-disciplinary care team collaboration (see longitudinal plan of care) . Comprehensive medication review performed; medication list updated in electronic medical record  Diabetes: . Uncontrolled; current treatment:  Levemir 30 units BID- plan to switch to [place] for improved PK,  reduced injection burden; Humalog per sliding scale; but patient notes she is just giving 5 units if pre meal sugar >200, but she is not always checking  . Reports an upcoming "fast" with her church - no breakfast, no sweets, no breads . Current glucose readings: fasting glucose: 140-180s, post prandial glucose: 250-300s. Previously submitted CGM order, but patient requested to wait until 2022 to initiate.  . Denies hypoglycemic symptoms . Current meal patterns: Breakfast: 10-10:15 am; fasting for the next 21 days, but historically grits, sometimes pancakes w/ sugar free jam or sugar free syrup; sometimes wheat toast; coffee, tea, or apple juice; Lunch:  ~2-3 pm: burger, leftovers, sometimes BLT, soup; Snack: apples, grapes, occasional banana; sometimes potato chips; Supper: sometimes baked chicken; sometimes meatloaf, meatballs and spaghetti; mac and cheese; sometimes salads; cabbage, strings, boiled potatoes, garlic bread; Dessert: has cut out ice cream, but sometimes jello- plans to change to sugar free; 1 piece of cake or pie, but this is few and far between. Sometimes fruits. Drinks: sugar free sodas; no sugar juice; notes that she doesn't drink as much water as she should - ~24-36 oz daily. PCP suggested nutrition consult, patient will consider . Current exercise: none, limited by mobility  . Stop Levemir, start Tresiba 50 units daily - dose reduction d/t increased potency and change in diet. Start Humalog 5 units with lunch and supper.  . Patient will call [facility] to request order for Libre 2 CGM. She will call me when she receives this and we will schedule a nurse visit for CGM education and then a follow up with me at least 2 weeks later for CGM download and review. She thinks she has an Android phone, but isn't sure what model. She did not bring it with her into office today.   Hypertension in the setting of CKD: . Controlled; current treatment: amlodipine 5 mg daily QPM, losartan 100 mg QAM, carvedilol 25 mg BID, furosemide 40 mg BID . Recommend to continue current regimen at this time  Hyperlipidemia, secondary ASCVD prevention (CAD, MI 2014, PVD) . Controlled; current treatment: atorvastatin 40 mg daily . Antiplatelet regimen: aspirin 81 mg daily . Recommend to continue current regimen at this time  Patient Goals/Self-Care Activities . Over the next 90 days, patient will:  - take medications as prescribed check glucose TID using CGM, document, and provide at future appointments  Follow Up Plan: Face to Face appointment with care management team member scheduled for:  ~2-4 weeks after starting CGM. She will call me  when she receives. If I have not heard from her in 2 weeks, I will reach out     Medication Assistance: None required. Patient affirms current coverage meets needs.   Plan: Face to Face appointment with care management team member scheduled for: ~ 2-3 weeks after starting CGM  [name], PharmD, [place], [place] Clinical Pharmacist [employer] [facility] [employer] [phone]

## 2020-08-19 NOTE — Progress Notes (Signed)
No chief complaint on file.  Diabetes f/u  1. Still diet noncompliant likes cakes A1C 9.3 06/2020 took last levemir 30 mg bid last dose Tuesday and ssi humalog yesterday not tried CGM yet but tried on 21 day fast with church w/o sweets/breakfast  She has tresiba 56 units but has not started  Met with Catie pharmD today to discuss insulin and CGM  2. CKD 3b cr. 1.6 GFR 31 f/u renal 12/19/20 rec importance of diabetic control    Review of Systems  Constitutional: Negative for weight loss.  HENT: Negative for hearing loss.   Eyes: Negative for blurred vision.  Respiratory: Negative for shortness of breath.   Cardiovascular: Negative for chest pain.  Gastrointestinal: Negative for abdominal pain.  Musculoskeletal: Negative for falls.       +leg pain   Skin: Negative for rash.  Psychiatric/Behavioral: Negative for depression.   Past Medical History:  Diagnosis Date  . Chronic kidney disease   . Coronary artery disease    MI, pci to mid LAD 2014. LHC 80%OM, 60%RCA, 90% LAD  . Diabetes mellitus without complication ([place])   . Hypertension   . Sleep apnea    Past Surgical History:  Procedure Laterality Date  . ACNE CYST REMOVAL  2008   Groin area  . CARDIAC CATHETERIZATION    . COLONOSCOPY WITH PROPOFOL N/A 05/27/2020   Procedure: COLONOSCOPY WITH PROPOFOL;  Surgeon: [name], MD;  Location: [facility] ENDOSCOPY;  Service: Gastroenterology;  Laterality: N/A;  . CORONARY ANGIOPLASTY     Family History  Problem Relation Age of Onset  . Diabetes Mother   . Heart disease Father    Social History   Socioeconomic History  . Marital status: Single    Spouse name: Not on file  . Number of children: Not on file  . Years of education: Not on file  . Highest education level: Not on file  Occupational History  . Not on file  Tobacco Use  . Smoking status: Never Smoker  . Smokeless tobacco: Never Used  Vaping Use  . Vaping Use: Never used  Substance and Sexual Activity  .  Alcohol use: Never  . Drug use: Never  . Sexual activity: Not on file  Other Topics Concern  . Not on file  Social History Narrative   Used to live in [place] Determinants of Health   Financial Resource Strain: Low Risk   . Difficulty of Paying Living Expenses: Not hard at all  Food Insecurity: No Food Insecurity  . Worried About Charity fundraiser in the Last Year: Never true  . Ran Out of Food in the Last Year: Never true  Transportation Needs: Not on file  Physical Activity: Not on file  Stress: Not on file  Social Connections: Not on file  Intimate Partner Violence: Not on file   No outpatient medications have been marked as taking for the 08/19/20 encounter (Office Visit) with McLean-Scocuzza, Nino Glow, MD.   Allergies  Allergen Reactions  . Amoxicillin Itching   Recent Results (from the past 2160 hour(s))  SARS CORONAVIRUS 2 (TAT 6-24 HRS) Nasopharyngeal Nasopharyngeal Swab     Status: None   Collection Time: 05/25/20 12:09 PM   Specimen: Nasopharyngeal Swab  Result Value Ref Range   SARS Coronavirus 2 NEGATIVE NEGATIVE    Comment: (NOTE) SARS-CoV-2 target nucleic acids are NOT DETECTED.  The SARS-CoV-2 RNA is generally detectable in upper and lower  respiratory specimens during the acute phase of infection. Negative results do not preclude SARS-CoV-2 infection, do not rule out co-infections with other pathogens, and should not be used as the sole basis for treatment or other patient management decisions. Negative results must be combined with clinical observations, patient history, and epidemiological information. The expected result is Negative.  Fact Sheet for Patients: SugarRoll.be  Fact Sheet for Healthcare Providers: https://www.woods-mathews.com/  This test is not yet approved or cleared by the [place] FDA and  has been authorized for detection and/or diagnosis of SARS-CoV-2 by FDA  under an Emergency Use Authorization (EUA). This EUA will remain  in effect (meaning this test can be used) for the duration of the COVID-19 declaration under Se ction 564(b)(1) of the Act, 21 U.S.C. section 360bbb-3(b)(1), unless the authorization is terminated or revoked sooner.  Performed at [facility], [place] [address] [place], [place] [address]   Glucose, capillary     Status: Abnormal   Collection Time: 05/27/20  9:37 AM  Result Value Ref Range   Glucose-Capillary 163 (H) 70 - 99 mg/dL    Comment: Glucose reference range applies only to samples taken after fasting for at least 8 hours.  Surgical pathology     Status: None   Collection Time: 05/27/20 12:23 PM  Result Value Ref Range   SURGICAL PATHOLOGY      SURGICAL PATHOLOGY CASE: [id number] PATIENT: Vickie Kemp Surgical Pathology Report     Specimen Submitted: A. Colon polyp x3, ascending; cold snare B. Colon, random; cbx  Clinical History: Underlying colitis K52.9.  Colon polyps.      DIAGNOSIS: A. COLON POLYP X3, ASCENDING; COLD SNARE: - TUBULAR ADENOMA, THREE FRAGMENTS. - NEGATIVE FOR HIGH-GRADE DYSPLASIA AND MALIGNANCY.  B. COLON, RANDOM; COLD BIOPSY: - COLONIC MUCOSA WITH NO SIGNIFICANT PATHOLOGIC ALTERATION. - NEGATIVE FOR MICROSCOPIC COLITIS, DYSPLASIA, AND MALIGNANCY.   GROSS DESCRIPTION: A. Labeled: Cold snare polyp x3 ascending colon Received: Formalin Tissue fragment(s): Multiple Size: Aggregate, 1.4 x 1.2 x 0.6 cm Description: Received are fragments of tan-pink soft tissue admixed with fecal matter.  The ratio of soft tissue to fecal matter is 90: 10.  The largest fragment has a distinct resection margin which is inked.  The fragment is bisected. Entirely submitted in 1 cassette.   B. Labeled: cbx random colon for chronic diarrhea Received: Formalin Tissue fragment(s): Multiple Size: Aggregate, 2.1 x 0.7 x 0.3 cm Description: Tan-pink soft tissue fragments Entirely  submitted in 1 cassette.   Final Diagnosis performed by Betsy Pries, MD.   Electronically signed 05/28/2020 8:43:47AM The electronic signature indicates that the named Attending Pathologist has evaluated the specimen Technical component performed at [facility], [address], [place], [place] [address] Lab: [phone] Dir: Rush Farmer, MD, MMM  Professional component performed at [facility], [facility], [place], [place],  [address] Lab: [phone] Dir: Dellia Nims. Rubinas, MD   Basic metabolic panel     Status: Abnormal   Collection Time: 06/02/20 11:03 AM  Result Value Ref Range   Sodium 142 135 - 145 mmol/L   Potassium 4.4 3.5 - 5.1 mmol/L   Chloride 104 98 - 111 mmol/L   CO2 29 22 - 32 mmol/L   Glucose, Bld 130 (H) 70 - 99 mg/dL    Comment: Glucose reference range applies only to samples taken after fasting for at least 8 hours.   BUN 26 (H) 8 - 23 mg/dL   Creatinine, Ser 1.60 (H) 0.44 - 1.00 mg/dL  Calcium 9.2 8.9 - 10.3 mg/dL   GFR, Estimated 31 (L) >60 mL/min   Anion gap 9 5 - 15    Comment: Performed at [facility], [address] [place], [place] [address]  Microalbumin / creatinine urine ratio     Status: None   Collection Time: 06/16/20  9:28 AM  Result Value Ref Range   Microalb, Ur <0.7 0.0 - 1.9 mg/dL   Creatinine,U 84.7 mg/dL   Microalb Creat Ratio 0.8 0.0 - 30.0 mg/g  CBC with Differential/Platelet     Status: None   Collection Time: 06/16/20  9:28 AM  Result Value Ref Range   WBC 8.3 4.0 - 10.5 K/uL   RBC 4.41 3.87 - 5.11 Mil/uL   Hemoglobin 12.2 12.0 - 15.0 g/dL   HCT 38.0 36.0 - 46.0 %   MCV 86.2 78.0 - 100.0 fl   MCHC 32.2 30.0 - 36.0 g/dL   RDW 14.8 11.5 - 15.5 %   Platelets 202.0 150.0 - 400.0 K/uL   Neutrophils Relative % 54.6 43.0 - 77.0 %   Lymphocytes Relative 32.7 12.0 - 46.0 %   Monocytes Relative 9.6 3.0 - 12.0 %   Eosinophils Relative 2.5 0.0 - 5.0 %   Basophils Relative 0.6 0.0 - 3.0 %    Neutro Abs 4.5 1.4 - 7.7 K/uL   Lymphs Abs 2.7 0.7 - 4.0 K/uL   Monocytes Absolute 0.8 0.1 - 1.0 K/uL   Eosinophils Absolute 0.2 0.0 - 0.7 K/uL   Basophils Absolute 0.1 0.0 - 0.1 K/uL  TSH     Status: None   Collection Time: 06/16/20  9:28 AM  Result Value Ref Range   TSH 2.12 0.35 - 4.50 uIU/mL  Lipid panel     Status: Abnormal   Collection Time: 06/16/20  9:28 AM  Result Value Ref Range   Cholesterol 106 0 - 200 mg/dL    Comment: ATP III Classification       Desirable:  < 200 mg/dL               Borderline High:  200 - 239 mg/dL          High:  > = 240 mg/dL   Triglycerides 70.0 0.0 - 149.0 mg/dL    Comment: Normal:  <150 mg/dLBorderline High:  150 - 199 mg/dL   HDL 29.50 (L) >39.00 mg/dL   VLDL 14.0 0.0 - 40.0 mg/dL   LDL Cholesterol 62 0 - 99 mg/dL   Total CHOL/HDL Ratio 4     Comment:                Men          Women1/2 Average Risk     3.4          3.3Average Risk          5.0          4.42X Average Risk          9.6          7.13X Average Risk          15.0          11.0                       NonHDL 76.17     Comment: NOTE:  Non-HDL goal should be 30 mg/dL higher than patient's LDL goal (i.e. LDL goal of < 70 mg/dL, would have non-HDL goal of < 100  mg/dL)  Hemoglobin A1c     Status: Abnormal   Collection Time: 06/16/20  9:28 AM  Result Value Ref Range   Hgb A1c MFr Bld 9.3 (H) 4.6 - 6.5 %    Comment: Glycemic Control Guidelines for People with Diabetes:Non Diabetic:  <6%Goal of Therapy: <7%Additional Action Suggested:  >8%   Hepatic function panel     Status: None   Collection Time: 06/16/20  9:28 AM  Result Value Ref Range   Total Bilirubin 0.4 0.2 - 1.2 mg/dL   Bilirubin, Direct 0.1 0.0 - 0.3 mg/dL   Alkaline Phosphatase 117 39 - 117 U/L   AST 14 0 - 37 U/L   ALT 9 0 - 35 U/L   Total Protein 7.5 6.0 - 8.3 g/dL   Albumin 4.2 3.5 - 5.2 g/dL   Objective  Body mass index is 31.97 kg/m. Wt Readings from Last 3 Encounters:  08/19/20 186 lb 4 oz (84.5 kg)  08/02/20 188  lb 12.8 oz (85.6 kg)  07/20/20 189 lb 6 oz (85.9 kg)   Temp Readings from Last 3 Encounters:  08/19/20 98.1 F (36.7 C) (Oral)  07/20/20 97.6 F (36.4 C) (Oral)  05/27/20 98 F (36.7 C) (Temporal)   BP Readings from Last 3 Encounters:  08/19/20 (!) 142/64  08/02/20 130/69  07/20/20 138/68   Pulse Readings from Last 3 Encounters:  08/19/20 64  08/02/20 61  07/20/20 70    Physical Exam Vitals and nursing note reviewed.  Constitutional:      Appearance: Normal appearance. She is well-developed and well-groomed. She is obese.  HENT:     Head: Normocephalic and atraumatic.  Eyes:     Conjunctiva/sclera: Conjunctivae normal.     Pupils: Pupils are equal, round, and reactive to light.  Cardiovascular:     Rate and Rhythm: Normal rate and regular rhythm.     Heart sounds: Normal heart sounds. No murmur heard.   Pulmonary:     Effort: Pulmonary effort is normal.     Breath sounds: Normal breath sounds.  Musculoskeletal:     Right lower leg: 2+ Edema present.     Left lower leg: 2+ Edema present.  Skin:    General: Skin is warm and dry.     Comments: Chronic lymphedema with stasis derm changes b/l legs  Neurological:     General: No focal deficit present.     Mental Status: She is alert and oriented to person, place, and time. Mental status is at baseline.     Gait: Gait normal.  Psychiatric:        Attention and Perception: Attention and perception normal.        Mood and Affect: Mood and affect normal.        Speech: Speech normal.        Behavior: Behavior normal. Behavior is cooperative.        Thought Content: Thought content normal.        Cognition and Memory: Cognition and memory normal.        Judgment: Judgment normal.     Assessment  Plan  Hypertension associated with diabetes ([place]) Stage 3b chronic kidney disease ([place])  rec DM control consider nutrition  rec CGM tresiba 56 units, SSI humalog  F/u renal 12/19/20   HM Flu shot utd pna 23  utd ConsiderPrevnar, tdap, shingrixvaccine in the future covid 19 vxutd 2/2consider booster 9/17 to 06/30/20 Booster [facility] pfizer 05/2020 at [facility]  Hep C neg 06/10/19 Carotid artery  plaque b/l  DEXA 11/20/16 osteopenia  Out of pap window Colonoscopysch 05/27/20  Mammogram 10/26/16 negative,declines mammogram for now as of 10/1/21but ordered and pt will sch in future mentioned again 08/19/20   Never smoker   07/08/16 lumbar mild L4/5 mild disc narrowingGrade I anterolisthesis L4 and L5 with associated disc space narrowing without evidence of fracture. Arthritis 08/12/14 degenerative changes  CT scan 07/24/16 negative  ABI right 0.64, left 0.62 moderate 05/30/17 moderate PVD 05/25/17 left maxillary sinus disease  Specialists  [place] [facility]    Provider: Dr. Olivia Mackie McLean-Scocuzza-Internal Medicine

## 2020-08-19 NOTE — Patient Instructions (Addendum)
It was great to see you today!  Call [facility] to place the order for the [facility] 2 Continuous Glucose Monitor. Their number is [phone]. Please let me know if they say that Dr. Terese Door and I need to send any additional information.   When you receive this in the mail, call me, and we can schedule a nurse visit to teach you how to use it.   Take Tresiba 50 units once daily  For now, take Humalog 5 units with lunch and supper. If you do not eat a meal, DO NOT take Humalog.   Call me with any questions.   [name], PharmD [phone]  Visit Information  Patient Care Plan: Medication Management    Problem Identified: Diabetes, CAD, PVD, HTN     Long-Range Goal: Disease Progression Prevention   Start Date: 06/18/2020  Expected End Date: 09/18/2020  This Visit's Progress: On track  Recent Progress: On track  Priority: High  Note:   Current Barriers:  . Unable to achieve control of diabetes  Pharmacist Clinical Goal(s):  [name] Over the next 90 days, patient will improve control of diabetes through collaboration with PharmD and provider.   Interventions: . 1:1 collaboration with McLean-Scocuzza, Nino Glow, MD regarding development and update of comprehensive plan of care as evidenced by provider attestation and co-signature . Inter-disciplinary care team collaboration (see longitudinal plan of care) . Comprehensive medication review performed; medication list updated in electronic medical record  Diabetes: . Uncontrolled; current treatment:  Levemir 30 units BID- plan to switch to [place] for improved PK, reduced injection burden; Humalog per sliding scale; but patient notes she is just giving 5 units if pre meal sugar >200, but she is not always checking  . Reports an upcoming "fast" with her church - no breakfast, no sweets, no breads . Current glucose readings: fasting glucose: 140-180s, post prandial glucose: 250-300s. Previously submitted CGM order, but  patient requested to wait until 2022 to initiate.  . Denies hypoglycemic symptoms . Current meal patterns: Breakfast: 10-10:15 am; fasting for the next 21 days, but historically grits, sometimes pancakes w/ sugar free jam or sugar free syrup; sometimes wheat toast; coffee, tea, or apple juice; Lunch: ~2-3 pm: burger, leftovers, sometimes BLT, soup; Snack: apples, grapes, occasional banana; sometimes potato chips; Supper: sometimes baked chicken; sometimes meatloaf, meatballs and spaghetti; mac and cheese; sometimes salads; cabbage, strings, boiled potatoes, garlic bread; Dessert: has cut out ice cream, but sometimes jello- plans to change to sugar free; 1 piece of cake or pie, but this is few and far between. Sometimes fruits. Drinks: sugar free sodas; no sugar juice; notes that she doesn't drink as much water as she should - ~24-36 oz daily. PCP suggested nutrition consult, patient will consider . Current exercise: none, limited by mobility  . Stop Levemir, start Tresiba 50 units daily - dose reduction d/t increased potency and change in diet. Start Humalog 5 units with lunch and supper.  . Patient will call [facility] to request order for Libre 2 CGM. She will call me when she receives this and we will schedule a nurse visit for CGM education and then a follow up with me at least 2 weeks later for CGM download and review. She thinks she has an Android phone, but isn't sure what model. She did not bring it with her into office today.   Hypertension in the setting of CKD: . Controlled; current treatment: amlodipine 5 mg daily QPM, losartan 100 mg QAM, carvedilol 25  mg BID, furosemide 40 mg BID . Recommend to continue current regimen at this time  Hyperlipidemia, secondary ASCVD prevention (CAD, MI 2014, PVD) . Controlled; current treatment: atorvastatin 40 mg daily . Antiplatelet regimen: aspirin 81 mg daily . Recommend to continue current regimen at this time  Patient Goals/Self-Care  Activities . Over the next 90 days, patient will:  - take medications as prescribed check glucose TID using CGM, document, and provide at future appointments  Follow Up Plan: Face to Face appointment with care management team member scheduled for:  ~2-4 weeks after starting CGM. She will call me when she receives. If I have not heard from her in 2 weeks, I will reach out             Print copy of patient instructions, educational materials, and care plan provided in person.  Plan: Face to Face appointment with care management team member scheduled for: ~ 2-3 weeks after starting CGM  [name], PharmD, [place], [place] Clinical Pharmacist [employer] [facility] [employer] [phone]

## 2020-08-26 ENCOUNTER — Encounter: Payer: Self-pay | Admitting: Internal Medicine

## 2020-08-30 ENCOUNTER — Ambulatory Visit: Payer: Medicare Other | Admitting: Pulmonary Disease

## 2020-09-06 DIAGNOSIS — E1165 Type 2 diabetes mellitus with hyperglycemia: Secondary | ICD-10-CM | POA: Diagnosis not present

## 2020-09-09 ENCOUNTER — Encounter: Payer: Self-pay | Admitting: Internal Medicine

## 2020-09-11 DIAGNOSIS — G4733 Obstructive sleep apnea (adult) (pediatric): Secondary | ICD-10-CM | POA: Diagnosis not present

## 2020-09-13 ENCOUNTER — Encounter: Payer: Self-pay | Admitting: Primary Care

## 2020-09-13 ENCOUNTER — Encounter (INDEPENDENT_AMBULATORY_CARE_PROVIDER_SITE_OTHER): Payer: Self-pay

## 2020-09-13 ENCOUNTER — Ambulatory Visit: Payer: Medicare Other | Admitting: Primary Care

## 2020-09-13 ENCOUNTER — Other Ambulatory Visit: Payer: Self-pay

## 2020-09-13 VITALS — BP 138/66 | HR 67 | Temp 97.8°F | Ht 64.0 in | Wt 196.6 lb

## 2020-09-13 DIAGNOSIS — R0609 Other forms of dyspnea: Secondary | ICD-10-CM

## 2020-09-13 DIAGNOSIS — R0602 Shortness of breath: Secondary | ICD-10-CM | POA: Diagnosis not present

## 2020-09-13 DIAGNOSIS — G4733 Obstructive sleep apnea (adult) (pediatric): Secondary | ICD-10-CM

## 2020-09-13 DIAGNOSIS — Z9989 Dependence on other enabling machines and devices: Secondary | ICD-10-CM | POA: Diagnosis not present

## 2020-09-13 DIAGNOSIS — R0600 Dyspnea, unspecified: Secondary | ICD-10-CM

## 2020-09-13 MED ORDER — ALBUTEROL SULFATE HFA 108 (90 BASE) MCG/ACT IN AERS: 1.0000 | INHALATION_SPRAY | Freq: Four times a day (QID) | RESPIRATORY_TRACT | 2 refills | Status: DC | PRN

## 2020-09-13 NOTE — Patient Instructions (Addendum)
Sleep apnea:  - Continue to wear CPAP every night for minimum 4-6 hours or more each night - Try and see if you can turn up humidification on CPAP (you make need to called [employer])  Heart failure: - Take lasix earlier in the afternoon  Shortness of breath: - Sending in prescription for rescue inhaler called albuterol (ventolin) - Take 2 puffs every 6 hours AS NEEDED only for shortness of breath or wheezing   Orders: - PFTs re: dyspnea on exertion - Change CPAP pressure 5-12cm h20  Follow-up: - 3 months Dr. Mortimer Fries (new patient) or APP     Sleep Apnea Sleep apnea affects breathing during sleep. It causes breathing to stop for a short time or to become shallow. It can also increase the risk of:  Heart attack.  Stroke.  Being very overweight (obese).  Diabetes.  Heart failure.  Irregular heartbeat. The goal of treatment is to help you breathe normally again. What are the causes? There are three kinds of sleep apnea:  Obstructive sleep apnea. This is caused by a blocked or collapsed airway.  Central sleep apnea. This happens when the brain does not send the right signals to the muscles that control breathing.  Mixed sleep apnea. This is a combination of obstructive and central sleep apnea. The most common cause of this condition is a collapsed or blocked airway. This can happen if:  Your throat muscles are too relaxed.  Your tongue and tonsils are too large.  You are overweight.  Your airway is too small.   What increases the risk?  Being overweight.  Smoking.  Having a small airway.  Being older.  Being female.  Drinking alcohol.  Taking medicines to calm yourself (sedatives or tranquilizers).  Having family members with the condition. What are the signs or symptoms?  Trouble staying asleep.  Being sleepy or tired during the day.  Getting angry a lot.  Loud snoring.  Headaches in the morning.  Not being able to focus your mind  (concentrate).  Forgetting things.  Less interest in sex.  Mood swings.  Personality changes.  Feelings of sadness (depression).  Waking up a lot during the night to pee (urinate).  Dry mouth.  Sore throat. How is this diagnosed?  Your medical history.  A physical exam.  A test that is done when you are sleeping (sleep study). The test is most often done in a sleep lab but may also be done at home. How is this treated?  Sleeping on your side.  Using a medicine to get rid of mucus in your nose (decongestant).  Avoiding the use of alcohol, medicines to help you relax, or certain pain medicines (narcotics).  Losing weight, if needed.  Changing your diet.  Not smoking.  Using a machine to open your airway while you sleep, such as: ? An oral appliance. This is a mouthpiece that shifts your lower jaw forward. ? A CPAP device. This device blows air through a mask when you breathe out (exhale). ? An EPAP device. This has valves that you put in each nostril. ? A BPAP device. This device blows air through a mask when you breathe in (inhale) and breathe out.  Having surgery if other treatments do not work. It is important to get treatment for sleep apnea. Without treatment, it can lead to:  High blood pressure.  Coronary artery disease.  In men, not being able to have an erection (impotence).  Reduced thinking ability.   Follow these instructions  at home: Lifestyle  Make changes that your doctor recommends.  Eat a healthy diet.  Lose weight if needed.  Avoid alcohol, medicines to help you relax, and some pain medicines.  Do not use any products that contain nicotine or tobacco, such as cigarettes, e-cigarettes, and chewing tobacco. If you need help quitting, ask your doctor. General instructions  Take over-the-counter and prescription medicines only as told by your doctor.  If you were given a machine to use while you sleep, use it only as told by your  doctor.  If you are having surgery, make sure to tell your doctor you have sleep apnea. You may need to bring your device with you.  Keep all follow-up visits as told by your doctor. This is important. Contact a doctor if:  The machine that you were given to use during sleep bothers you or does not seem to be working.  You do not get better.  You get worse. Get help right away if:  Your chest hurts.  You have trouble breathing in enough air.  You have an uncomfortable feeling in your back, arms, or stomach.  You have trouble talking.  One side of your body feels weak.  A part of your face is hanging down. These symptoms may be an emergency. Do not wait to see if the symptoms will go away. Get medical help right away. Call your local emergency services (911 in the [place] Do not drive yourself to the hospital. Summary  This condition affects breathing during sleep.  The most common cause is a collapsed or blocked airway.  The goal of treatment is to help you breathe normally while you sleep. This information is not intended to replace advice given to you by your health care provider. Make sure you discuss any questions you have with your health care provider. Document Revised: 05/17/2018 Document Reviewed: 03/26/2018 Elsevier Patient Education  [place].

## 2020-09-13 NOTE — Progress Notes (Signed)
@Patient  ID: Vickie Kemp, female    DOB: 1941-03-05, 80 y.o.   MRN: [id number]  Chief Complaint  Patient presents with  . Follow-up    Unable to tolerate cpap. She reports of dry cough after using cpap. C/o sob with exertion and wheezing.     Referring provider: McLean-Scocuzza, Olivia Mackie *  HPI: 80 year old female, never smoked. PMH significant for CAD, HTN, type 2 diabetes, severe OSA, chronic maxillary sinusitis, chronic kidney disease.   09/13/2020 Patient presents today for 6 month follow-up sleep apnea. She has not been tolerating CPAP well. She reports a dry cough since she started wearing CPAP. She continues to use CPAP intermittently, she will stop using it for several nights d/t cough. She also experiences dyspnea on exertion with associated wheezing. She thought her breathing would improve significantly once she started using CPAP machine. She has hx heart failure and pulmonary hypertension. She takes lasix morning and at bedtime. She often wakes up several times at night to use the restroom.    Airview download 08/09/20-09/07/20: 10/30 (33%) days used; 20% > 4 hours Average usage 5 hours 8 mins   Pressure 5-15cm h20 (9.8cm h20-95%) Airleaks 7.7L/min (95%) AHI 2.8  Allergies  Allergen Reactions  . Amoxicillin Itching    Immunization History  Administered Date(s) Administered  . Fluad Quad(high Dose 65+) 04/28/2019, 05/14/2020  . Influenza, High Dose Seasonal PF 06/04/2017, 06/07/2018  . PFIZER(Purple Top)SARS-COV-2 Vaccination 10/08/2019, 10/29/2019, 05/14/2020  . Pneumococcal Polysaccharide-23 05/05/2013    Past Medical History:  Diagnosis Date  . Chronic kidney disease   . Coronary artery disease    MI, pci to mid LAD 2014. LHC 80%OM, 60%RCA, 90% LAD  . Diabetes mellitus without complication ([place])   . Hypertension   . Sleep apnea     Tobacco History: Social History   Tobacco Use  Smoking Status Never Smoker  Smokeless Tobacco Never Used   Counseling  given: Not Answered   Outpatient Medications Prior to Visit  Medication Sig Dispense Refill  . acetaminophen (TYLENOL) 650 MG CR tablet Take 650 mg by mouth 3 (three) times daily as needed.    [name] aspirin EC 81 MG tablet Take 81 mg by mouth daily.    . carvedilol (COREG) 25 MG tablet Take 1 tablet (25 mg total) by mouth 2 (two) times daily. 180 tablet 3  . dicyclomine (BENTYL) 10 MG capsule Take 1 capsule (10 mg total) by mouth 4 (four) times daily -  before meals and at bedtime. 15 capsule 0  . furosemide (LASIX) 40 MG tablet Take 1 tablet (40 mg total) by mouth in the morning and at bedtime. 60 tablet 3  . Glucose Blood (BLOOD GLUCOSE TEST STRIPS) STRP E11.9 check sugar tid for designated machine 300 strip 5  . insulin degludec (TRESIBA FLEXTOUCH) 200 UNIT/ML FlexTouch Pen Inject 50 Units into the skin daily. 30 mL 3  . insulin lispro (HUMALOG KWIKPEN) 100 UNIT/ML KwikPen Prn based on sugar If sugar 70-130 0 units If sugar 131-180 4 units If sugar 181-240 8 units If sugar 241-300 10 units If sugar 301-350 12 units If sugar 351-400 16 units If >400 20 units and call doctor 15 mL 11  . Insulin Pen Needle (PEN NEEDLES) 31G X 8 MM MISC 1 pen by Does not apply route 2 (two) times daily as needed. 100 each 3  . Insulin Pen Needle 32G X 4 MM MISC 1 Device by Does not apply route 4 (four) times daily - after  meals and at bedtime. 360 each 3  . latanoprost (XALATAN) 0.005 % ophthalmic solution Place 1 drop into the left eye at bedtime.    [name] losartan (COZAAR) 100 MG tablet Take 100 mg by mouth daily.    . Turmeric (QC TUMERIC COMPLEX PO) Take by mouth.     [name] amLODipine (NORVASC) 5 MG tablet Take 5 mg by mouth daily.    [name] atorvastatin (LIPITOR) 40 MG tablet Take 1 tablet (40 mg total) by mouth daily. 90 tablet 0   No facility-administered medications prior to visit.   Review of Systems  Review of Systems  Respiratory: Positive for cough, shortness of breath and wheezing.   Psychiatric/Behavioral:  Positive for sleep disturbance.   Physical Exam  BP 138/66 (BP Location: Left Arm, Cuff Size: Normal)   Pulse 67   Temp 97.8 F (36.6 C) (Temporal)   Ht 5\' 4"  (1.626 m)   Wt 196 lb 9.6 oz (89.2 kg)   LMP  (LMP Unknown)   SpO2 97%   BMI 33.75 kg/m  Physical Exam Constitutional:      Appearance: Normal appearance.  HENT:     Head: Normocephalic and atraumatic.     Mouth/Throat:     Mouth: Mucous membranes are moist.     Pharynx: Oropharynx is clear.     Comments: Mallampati class III Cardiovascular:     Rate and Rhythm: Normal rate and regular rhythm.  Pulmonary:     Effort: Pulmonary effort is normal.     Breath sounds: Normal breath sounds.  Neurological:     General: No focal deficit present.     Mental Status: She is alert and oriented to person, place, and time. Mental status is at baseline.  Psychiatric:        Mood and Affect: Mood normal.        Behavior: Behavior normal.        Thought Content: Thought content normal.        Judgment: Judgment normal.      Lab Results:  CBC    Component Value Date/Time   WBC 8.3 06/16/2020 0928   RBC 4.41 06/16/2020 0928   HGB 12.2 06/16/2020 0928   HCT 38.0 06/16/2020 0928   PLT 202.0 06/16/2020 0928   MCV 86.2 06/16/2020 0928   MCH 28.6 04/24/2020 1119   MCHC 32.2 06/16/2020 0928   RDW 14.8 06/16/2020 0928   LYMPHSABS 2.7 06/16/2020 0928   MONOABS 0.8 06/16/2020 0928   EOSABS 0.2 06/16/2020 0928   BASOSABS 0.1 06/16/2020 0928    BMET    Component Value Date/Time   NA 142 06/02/2020 1103   K 4.4 06/02/2020 1103   CL 104 06/02/2020 1103   CO2 29 06/02/2020 1103   GLUCOSE 130 (H) 06/02/2020 1103   BUN 26 (H) 06/02/2020 1103   CREATININE 1.60 (H) 06/02/2020 1103   CALCIUM 9.2 06/02/2020 1103   GFRNONAA 31 (L) 06/02/2020 1103   GFRAA 37 (L) 04/24/2020 1119    BNP    Component Value Date/Time   BNP 79.0 04/24/2020 1119    ProBNP No results found for: PROBNP  Imaging: No results  found.   Assessment & Plan:   OSA on CPAP - Severe OSA. She has been having difficulty tolerating CPAP d/t dry cough. Recommend decreasing pressure to 5-12cm h20. Continue to encourage patient try to wear CPAP every night for min 4-6 hours  Shortness of breath - Patient reports dyspnea on exertion with associated wheezing. Lungs were  clear on exam. She does have HF and pulmonary HTN. Continue lasix 40mg  twice a day. Sending in RX for Albuterol rescue inhaler to use as needed. Recommend checking pulmonary function testing  FU 3 months with Dr. Mortimer Fries (new patient)  Martyn Ehrich, NP 09/14/2020

## 2020-09-14 ENCOUNTER — Other Ambulatory Visit: Payer: Self-pay

## 2020-09-14 DIAGNOSIS — R0602 Shortness of breath: Secondary | ICD-10-CM | POA: Insufficient documentation

## 2020-09-14 MED ORDER — AMLODIPINE BESYLATE 5 MG PO TABS: 5.0000 mg | ORAL_TABLET | Freq: Every day | ORAL | 0 refills | Status: DC

## 2020-09-14 MED ORDER — ATORVASTATIN CALCIUM 40 MG PO TABS: 40.0000 mg | ORAL_TABLET | Freq: Every day | ORAL | 0 refills | Status: DC

## 2020-09-14 NOTE — Assessment & Plan Note (Addendum)
-   Patient reports dyspnea on exertion with associated wheezing. Lungs were clear on exam. She does have HF and pulmonary HTN. Continue lasix 40mg  twice a day. Sending in RX for Albuterol rescue inhaler to use as needed. Recommend checking pulmonary function testing

## 2020-09-14 NOTE — Assessment & Plan Note (Addendum)
-   Severe OSA. She has been having difficulty tolerating CPAP d/t dry cough. Recommend decreasing pressure to 5-12cm h20. Continue to encourage patient try to wear CPAP every night for min 4-6 hours

## 2020-09-15 ENCOUNTER — Other Ambulatory Visit: Payer: Self-pay

## 2020-09-17 ENCOUNTER — Ambulatory Visit (INDEPENDENT_AMBULATORY_CARE_PROVIDER_SITE_OTHER): Payer: Medicare Other | Admitting: Pharmacist

## 2020-09-17 ENCOUNTER — Other Ambulatory Visit: Payer: Self-pay

## 2020-09-17 DIAGNOSIS — I25119 Atherosclerotic heart disease of native coronary artery with unspecified angina pectoris: Secondary | ICD-10-CM | POA: Diagnosis not present

## 2020-09-17 DIAGNOSIS — E782 Mixed hyperlipidemia: Secondary | ICD-10-CM | POA: Diagnosis not present

## 2020-09-17 DIAGNOSIS — I10 Essential (primary) hypertension: Secondary | ICD-10-CM

## 2020-09-17 DIAGNOSIS — E1159 Type 2 diabetes mellitus with other circulatory complications: Secondary | ICD-10-CM | POA: Diagnosis not present

## 2020-09-17 DIAGNOSIS — I890 Lymphedema, not elsewhere classified: Secondary | ICD-10-CM | POA: Diagnosis not present

## 2020-09-17 MED ORDER — TRESIBA FLEXTOUCH 200 UNIT/ML ~~LOC~~ SOPN: 46.0000 [IU] | PEN_INJECTOR | Freq: Every day | SUBCUTANEOUS | 3 refills | Status: DC

## 2020-09-17 NOTE — Patient Instructions (Addendum)
It was great to see you today!  I'm proud of you for trying something new.   DECREASE Tresiba to 46 units daily. Continue Humalog 5 units with lunch and supper.   The sensor on your arm only holds about 8 hours of data. Try to scan AT LEAST every 8 hours. Most of my patients like to get into the habit of scanning when they wake up, scanning before and after meals, and when they go to bed. This will make sure that all of your blood sugar data is on the blue reader, so that when you come in for follow up appointments with [place] we can see all of your sugar readings.   Call me or send a MyChart with any questions or concerns!  [name], PharmD  [phone]  Visit Information  PATIENT GOALS: Goals Addressed              This Visit's Progress     Patient Stated   .  Medication Monitoring (pt-stated)        Patient Goals/Self-Care Activities . Over the next 90 days, patient will:  - take medications as prescribed check glucose TID using CGM, document, and provide at future appointments \       Print copy of patient instructions, educational materials, and care plan provided in person.  Plan: Face to Face appointment with care management team member scheduled for: ~ 4 weeks  [name], PharmD, [place], [place] Clinical Pharmacist [employer] at [employer] [phone]

## 2020-09-17 NOTE — Chronic Care Management (AMB) (Signed)
 Chronic Care Management Pharmacy Note  09/17/2020 Name:  Vickie Kemp MRN:  [id number] DOB:  09/25/1940  Subjective: Vickie Kemp is an 79 y.o. year old female who is a primary patient of McLean-Scocuzza, Tracy N, MD.  The CCM team was consulted for assistance with disease management and care coordination needs.    Engaged with patient face to face for follow up visit in response to provider referral for pharmacy case management and/or care coordination services.   Consent to Services:  The patient was given information about Chronic Care Management services, agreed to services, and gave verbal consent prior to initiation of services.  Please see initial visit note for detailed documentation.   Objective:  Lab Results  Component Value Date   CREATININE 1.60 (H) 06/02/2020   CREATININE 1.55 (H) 04/24/2020   CREATININE 1.49 (H) 03/19/2020    Lab Results  Component Value Date   HGBA1C 9.3 (H) 06/16/2020       Component Value Date/Time   CHOL 106 06/16/2020 0928   TRIG 70.0 06/16/2020 0928   HDL 29.50 (L) 06/16/2020 0928   CHOLHDL 4 06/16/2020 0928   VLDL 14.0 06/16/2020 0928   LDLCALC 62 06/16/2020 0928    BP Readings from Last 3 Encounters:  09/13/20 138/66  08/19/20 (!) 142/64  08/02/20 130/69    Assessment: Review of patient past medical history, allergies, medications, health status, including review of consultants reports, laboratory and other test data, was performed as part of comprehensive evaluation and provision of chronic care management services.   SDOH:  (Social Determinants of Health) assessments and interventions performed:  SDOH Interventions   Flowsheet Row Most Recent Value  SDOH Interventions   Financial Strain Interventions Intervention Not Indicated      CCM Care Plan  Allergies  Allergen Reactions  . Amoxicillin Itching    Medications Reviewed Today    Reviewed by Travis, Catherine E, RPH-CPP (Pharmacist) on 09/17/20 at 1146  Med  List Status: <None>  Medication Order Taking? Sig Documenting Provider Last Dose Status Informant  acetaminophen (TYLENOL) 650 MG CR tablet [id number]  Take 650 mg by mouth 3 (three) times daily as needed. [provider]  Active   albuterol (VENTOLIN HFA) 108 (90 Base) MCG/ACT inhaler [id number]  Inhale 1-2 puffs into the lungs every 6 (six) hours as needed for wheezing or shortness of breath. Walsh, Elizabeth W, NP  Active   amLODipine (NORVASC) 5 MG tablet [id number]  Take 1 tablet (5 mg total) by mouth daily. Agbor-Etang, Brian, MD  Active   aspirin EC 81 MG tablet [id number] Yes Take 81 mg by mouth daily. [provider] Taking Active   atorvastatin (LIPITOR) 40 MG tablet [id number] Yes Take 1 tablet (40 mg total) by mouth daily. Agbor-Etang, Brian, MD Taking Active   carvedilol (COREG) 25 MG tablet [id number] Yes Take 1 tablet (25 mg total) by mouth 2 (two) times daily. Agbor-Etang, Brian, MD Taking Active   dicyclomine (BENTYL) 10 MG capsule [id number] Yes Take 1 capsule (10 mg total) by mouth 4 (four) times daily -  before meals and at bedtime. Vanga, Rohini Reddy, MD Taking Active   furosemide (LASIX) 40 MG tablet [id number] Yes Take 1 tablet (40 mg total) by mouth in the morning and at bedtime. Agbor-Etang, Brian, MD Taking Active   Glucose Blood (BLOOD GLUCOSE TEST STRIPS) STRP [id number] Yes E11.9 check sugar tid for designated machine McLean-Scocuzza, Tracy N, MD Taking Active   insulin degludec (TRESIBA FLEXTOUCH) 200 UNIT/ML FlexTouch Pen [id number]    Inject 46 Units into the skin daily. McLean-Scocuzza, Nino Glow, MD  Active   insulin lispro (HUMALOG KWIKPEN) 100 UNIT/ML KwikPen [id number] Yes Prn based on sugar If sugar 70-130 0 units If sugar 131-180 4 units If sugar 181-240 8 units If sugar 241-300 10 units If sugar 301-350 12 units If sugar 351-400 16 units If >400 20 units and call doctor McLean-Scocuzza, Nino Glow, MD Taking Active   Insulin Pen Needle (PEN NEEDLES) 31G X 8 MM  MISC [id number] Yes 1 pen by Does not apply route 2 (two) times daily as needed. McLean-Scocuzza, Nino Glow, MD Taking Active   Insulin Pen Needle 32G X 4 MM MISC [id number] Yes 1 Device by Does not apply route 4 (four) times daily - after meals and at bedtime. McLean-Scocuzza, Nino Glow, MD Taking Active   latanoprost (XALATAN) 0.005 % ophthalmic solution [id number] Yes Place 1 drop into the left eye at bedtime. [provider] Taking Active   losartan (COZAAR) 100 MG tablet [id number] Yes Take 100 mg by mouth daily. [provider] Taking Active   Turmeric (QC TUMERIC COMPLEX PO) [id number] Yes Take by mouth.  [provider] Taking Active           Patient Active Problem List   Diagnosis Date Noted  . Shortness of breath 09/14/2020  . Chronic venous insufficiency 08/02/2020  . Chronic diarrhea of unknown origin   . Lymphedema 05/18/2020  . Benign hypertensive kidney disease with chronic kidney disease 05/03/2020  . Chronic kidney disease, stage IV (severe) ([place]) 05/03/2020  . Diabetes mellitus ([place]) 05/03/2020  . OSA on CPAP 02/29/2020  . Hypertension associated with diabetes ([place]) 01/20/2020  . Memory loss 01/20/2020  . Tremor 01/20/2020  . Essential hypertension 01/20/2020  . Left lumbar radiculopathy 12/26/2019  . At risk for obstructive sleep apnea 11/21/2019  . Allergic rhinitis 11/21/2019  . Cystitis 10/17/2019  . PVD (peripheral vascular disease) ([place]) 10/02/2019  . Stage 3b chronic kidney disease ([place]) 09/30/2019  . Lumbar radiculopathy 09/30/2019  . Cervicalgia 09/30/2019  . CKD (chronic kidney disease) stage 4, GFR 15-29 ml/min (HCC) 09/30/2019  . Arthritis 09/30/2019  . Chronic maxillary sinusitis 09/30/2019  . Venous stasis 08/28/2019  . Secondary hyperparathyroidism of renal origin ([place]) 07/29/2019  . Type 2 diabetes mellitus with diabetic chronic kidney disease ([place]) 06/10/2019  . Pain due to onychomycosis of toenails of both feet 05/01/2019   . Type 2 diabetes mellitus with vascular disease ([place]) 05/01/2019  . Chronic arthropathy 05/01/2019  . Obesity 01/21/2018  . CAD (coronary artery disease) 06/04/2017  . Hyperlipemia 06/04/2017  . Insulin dependent diabetes mellitus 06/04/2017  . S/P PTCA (percutaneous transluminal coronary angioplasty) 06/04/2017  . Palpitations 06/04/2017  . Essential (primary) hypertension 06/04/2015    Conditions to be addressed/monitored: HTN, HLD, DMII and CKD  Care Plan : Medication Management  Updates made by De Hollingshead, RPH-CPP since 09/17/2020 12:00 AM    Problem: Diabetes, CAD, PVD, HTN     Long-Range Goal: Disease Progression Prevention   Start Date: 06/18/2020  Expected End Date: 09/18/2020  Recent Progress: On track  Priority: High  Note:   Current Barriers:  . Unable to achieve control of diabetes   Pharmacist Clinical Goal(s):  [name] Over the next 90 days, patient will improve control of diabetes through collaboration with PharmD and provider.   Interventions: . 1:1 collaboration with McLean-Scocuzza, Nino Glow, MD regarding development and update of comprehensive plan of care as evidenced by provider attestation and co-signature .  Inter-disciplinary care team collaboration (see longitudinal plan of care) . Comprehensive medication review performed; medication list updated in electronic medical record  Diabetes: . Uncontrolled; current treatment: Tresiba 50 units daily, Humalog 5 units with lunch and supper  o Renal function limits use of metformin . Current glucose readings: fasting glucose: 70-90 . Reports symptoms of hypoglycemia two mornings ago when fasting was 74. Drank apple juice and ate a piece of toast and symptoms improved  . Current meal patterns: continues to eat a smaller breakfast. Admits that often she will "overindulge" in foods that are high in carbohydrates . Current exercise: none, limited by mobility d/t arthritis . Extensive discussion about benefits,  use of CGM Libre 2. Patient amenable. Patient brought supplies with her today. Coached through placement of sensor placement and use of reader. Educated to scan at least Q8H.  . Given low fasting glucose readings, reduce Tresiba to 46 units daily. Continue current Humalog 5 units with lunch and supper.  . Could consider use of GLP1 and/or SGLT2 in the future, though would be wary of exacerbation of GI symptoms w/ GLP1 and to ensure that eGFR remains appropriate for initiation of SGLT2. Continue to follow.   Hypertension in the setting of CKD: . Controlled; current treatment: amlodipine 5 mg daily QPM, losartan 100 mg QAM, carvedilol 25 mg BID, furosemide 40 mg BID . Recommend to continue current regimen at this time.  . Could consider use of SGLT2 moving forward, pending maintenance of appropriate renal function for initiation.   Hyperlipidemia, secondary ASCVD prevention (CAD, MI 2014, PVD) . Controlled per last lipid panel; current treatment: atorvastatin 40 mg daily . Antiplatelet regimen: aspirin 81 mg daily . Recommend to continue current regimen at this time  Patient Goals/Self-Care Activities . Over the next 90 days, patient will:  - take medications as prescribed check glucose TID using CGM, document, and provide at future appointments  Follow Up Plan: Face to Face appointment with care management team member scheduled for:  ~4 weeks     Medication Assistance: None required.  Patient affirms current coverage meets needs.  Follow Up:  Patient agrees to Care Plan and Follow-up.  Plan: Face to Face appointment with care management team member scheduled for: ~ 4 weeks  [name], PharmD, [place], [place] Clinical Pharmacist [employer] [facility] [employer] [phone]

## 2020-09-20 ENCOUNTER — Encounter (INDEPENDENT_AMBULATORY_CARE_PROVIDER_SITE_OTHER): Payer: Self-pay

## 2020-09-20 ENCOUNTER — Encounter: Payer: Self-pay | Admitting: Internal Medicine

## 2020-09-21 ENCOUNTER — Encounter (INDEPENDENT_AMBULATORY_CARE_PROVIDER_SITE_OTHER): Payer: Self-pay

## 2020-09-22 ENCOUNTER — Ambulatory Visit
Admission: EM | Admit: 2020-09-22 | Discharge: 2020-09-22 | Disposition: A | Discharge status: Home / Self Care | Payer: Medicare Other | Attending: Family Medicine | Admitting: Family Medicine

## 2020-09-22 ENCOUNTER — Encounter: Payer: Self-pay | Admitting: Emergency Medicine

## 2020-09-22 ENCOUNTER — Telehealth: Payer: Self-pay | Admitting: Internal Medicine

## 2020-09-22 ENCOUNTER — Encounter: Payer: Self-pay | Admitting: Internal Medicine

## 2020-09-22 ENCOUNTER — Other Ambulatory Visit: Payer: Self-pay

## 2020-09-22 DIAGNOSIS — T148XXA Other injury of unspecified body region, initial encounter: Secondary | ICD-10-CM

## 2020-09-22 DIAGNOSIS — M79671 Pain in right foot: Secondary | ICD-10-CM | POA: Diagnosis not present

## 2020-09-22 MED ORDER — PREDNISONE 10 MG PO TABS: 20.0000 mg | ORAL_TABLET | Freq: Every day | ORAL | 0 refills | Status: AC

## 2020-09-22 NOTE — Discharge Instructions (Addendum)
Treating you for gout. I do not think the blisters are related to the foot pain.  Please follow up with your doctor next week

## 2020-09-22 NOTE — Telephone Encounter (Signed)
Please call [facility] to make an follow up appointment ASAP to make sure this blister goes away  For now avoid compression sock and wear the boots   Foot and toe could be neuropathy but you also have peripheral vascular disease which can cause pain  To diagnosis neuropathy you would need to see neurology  -do you want a referral ?     ===View-only below this line===  Dr. Delana Meyer can you all sch pt appt    ----- Message -----      From:Brittanya Papania      Sent:09/22/2020 11:13 AM EST        KH:VFMBB N McLean-Scocuzza, MD   Subject:FOOT PAIN  Dr. Linus Orn, I reached out to [name] a couple of weeks ago about a water blister on my right leg.  To add to that I am experiencing foot and toe pain more in the right than left.  Yes, I have been wearing the compression sock, though I have not been able to get them on this morning yet. Also, I have just received the Pump Boots that Dr. Delana Meyer had recommended for the swelling.  The feet and toes are very painful from time to time, could it be Neuropathy or side effect from [place]?  Thank you. Vickie Kemp

## 2020-09-22 NOTE — ED Triage Notes (Signed)
Pt presents with fluid blisters to right lower ext x 1 week. She also c/o of pain to right foot x 4 days.

## 2020-09-22 NOTE — ED Provider Notes (Addendum)
[facility]    CSN: [id number] Arrival date & time: 09/22/20  1506      History   Chief Complaint Chief Complaint  Patient presents with  . Blister    RLE    HPI Vickie Kemp is a 80 y.o. female.   Pt is a 80 year old female with past medical history of chronic kidney disease stage IV, CAD, hypertension, diabetes, PVD.  She presents today with blisters to right lower extremity and pain in the right foot clusters of been there for approximately 1 week or more.  These do not cause her any discomfort.  Does have a history of lower extremity edema and wears compression stockings.  Has not been wearing these due to the blisters.  The pain in the right foot started approximately 3 to 4 days ago.  The pain is located to the great toe and proximal to the joint space.  There is some mild redness and increased warmth.  Denies any specific history of gout.  Denies any injuries to the foot. No fever. No chest pain, SOB     Past Medical History:  Diagnosis Date  . Chronic kidney disease   . Coronary artery disease    MI, pci to mid LAD 2014. LHC 80%OM, 60%RCA, 90% LAD  . Diabetes mellitus without complication ([place])   . Hypertension   . Sleep apnea     Patient Active Problem List   Diagnosis Date Noted  . Shortness of breath 09/14/2020  . Chronic venous insufficiency 08/02/2020  . Chronic diarrhea of unknown origin   . Lymphedema 05/18/2020  . Benign hypertensive kidney disease with chronic kidney disease 05/03/2020  . Chronic kidney disease, stage IV (severe) ([place]) 05/03/2020  . Diabetes mellitus ([place]) 05/03/2020  . OSA on CPAP 02/29/2020  . Hypertension associated with diabetes ([place]) 01/20/2020  . Memory loss 01/20/2020  . Tremor 01/20/2020  . Essential hypertension 01/20/2020  . Left lumbar radiculopathy 12/26/2019  . At risk for obstructive sleep apnea 11/21/2019  . Allergic rhinitis 11/21/2019  . Cystitis 10/17/2019  . PVD (peripheral vascular disease) ([place])  10/02/2019  . Stage 3b chronic kidney disease ([place]) 09/30/2019  . Lumbar radiculopathy 09/30/2019  . Cervicalgia 09/30/2019  . CKD (chronic kidney disease) stage 4, GFR 15-29 ml/min (HCC) 09/30/2019  . Arthritis 09/30/2019  . Chronic maxillary sinusitis 09/30/2019  . Venous stasis 08/28/2019  . Secondary hyperparathyroidism of renal origin ([place]) 07/29/2019  . Type 2 diabetes mellitus with diabetic chronic kidney disease ([place]) 06/10/2019  . Pain due to onychomycosis of toenails of both feet 05/01/2019  . Type 2 diabetes mellitus with vascular disease ([place]) 05/01/2019  . Chronic arthropathy 05/01/2019  . Obesity 01/21/2018  . CAD (coronary artery disease) 06/04/2017  . Hyperlipemia 06/04/2017  . Insulin dependent diabetes mellitus 06/04/2017  . S/P PTCA (percutaneous transluminal coronary angioplasty) 06/04/2017  . Palpitations 06/04/2017  . Essential (primary) hypertension 06/04/2015    Past Surgical History:  Procedure Laterality Date  . ACNE CYST REMOVAL  2008   Groin area  . CARDIAC CATHETERIZATION    . COLONOSCOPY WITH PROPOFOL N/A 05/27/2020   Procedure: COLONOSCOPY WITH PROPOFOL;  Surgeon: [name], MD;  Location: [facility] ENDOSCOPY;  Service: Gastroenterology;  Laterality: N/A;  . CORONARY ANGIOPLASTY      OB History   No obstetric history on file.      Home Medications    Prior to Admission medications   Medication Sig Start Date End Date Taking? Authorizing Provider  predniSONE (DELTASONE)  10 MG tablet Take 2 tablets (20 mg total) by mouth daily for 3 days. 09/22/20 09/25/20 Yes Tambra Muller A, NP  acetaminophen (TYLENOL) 650 MG CR tablet Take 650 mg by mouth 3 (three) times daily as needed.    [provider]  albuterol (VENTOLIN HFA) 108 (90 Base) MCG/ACT inhaler Inhale 1-2 puffs into the lungs every 6 (six) hours as needed for wheezing or shortness of breath. 09/13/20   Martyn Ehrich, NP  amLODipine (NORVASC) 5 MG tablet Take 1 tablet (5 mg  total) by mouth daily. 09/14/20   Kate Sable, MD  aspirin EC 81 MG tablet Take 81 mg by mouth daily.    [provider]  atorvastatin (LIPITOR) 40 MG tablet Take 1 tablet (40 mg total) by mouth daily. 09/14/20   Kate Sable, MD  carvedilol (COREG) 25 MG tablet Take 1 tablet (25 mg total) by mouth 2 (two) times daily. 08/02/20 10/31/20  Kate Sable, MD  dicyclomine (BENTYL) 10 MG capsule Take 1 capsule (10 mg total) by mouth 4 (four) times daily -  before meals and at bedtime. 07/20/20   [name], MD  furosemide (LASIX) 40 MG tablet Take 1 tablet (40 mg total) by mouth in the morning and at bedtime. 03/18/20   Kate Sable, MD  Glucose Blood (BLOOD GLUCOSE TEST STRIPS) STRP E11.9 check sugar tid for designated machine 11/05/19   McLean-Scocuzza, Nino Glow, MD  insulin degludec (TRESIBA FLEXTOUCH) 200 UNIT/ML FlexTouch Pen Inject 46 Units into the skin daily. 09/17/20   McLean-Scocuzza, Nino Glow, MD  insulin lispro (HUMALOG KWIKPEN) 100 UNIT/ML KwikPen Prn based on sugar If sugar 70-130 0 units If sugar 131-180 4 units If sugar 181-240 8 units If sugar 241-300 10 units If sugar 301-350 12 units If sugar 351-400 16 units If >400 20 units and call doctor 12/31/19   McLean-Scocuzza, Nino Glow, MD  Insulin Pen Needle (PEN NEEDLES) 31G X 8 MM MISC 1 pen by Does not apply route 2 (two) times daily as needed. 12/15/19   McLean-Scocuzza, Nino Glow, MD  Insulin Pen Needle 32G X 4 MM MISC 1 Device by Does not apply route 4 (four) times daily - after meals and at bedtime. 02/12/20   McLean-Scocuzza, Nino Glow, MD  latanoprost (XALATAN) 0.005 % ophthalmic solution Place 1 drop into the left eye at bedtime.    [provider]  losartan (COZAAR) 100 MG tablet Take 100 mg by mouth daily. 03/31/20 03/31/21  [provider]  Turmeric (QC TUMERIC COMPLEX PO) Take by mouth.     [provider]    Family History Family History  Problem Relation Age of Onset  . Diabetes  Mother   . Heart disease Father     Social History Social History   Tobacco Use  . Smoking status: Never Smoker  . Smokeless tobacco: Never Used  Vaping Use  . Vaping Use: Never used  Substance Use Topics  . Alcohol use: Never  . Drug use: Never     Allergies   Amoxicillin   Review of Systems Review of Systems   Physical Exam Triage Vital Signs ED Triage Vitals  Enc Vitals Group     BP 09/22/20 1516 (!) 153/77     Pulse Rate 09/22/20 1516 70     Resp 09/22/20 1516 (!) 24     Temp 09/22/20 1516 98.5 F (36.9 C)     Temp Source 09/22/20 1516 Oral     SpO2 09/22/20 1516 91 %  Weight --      Height --      Head Circumference --      Peak Flow --      Pain Score 09/22/20 1519 9     Pain Loc --      Pain Edu? --      Excl. in [place]? --    No data found.  Updated Vital Signs BP (!) 153/77 (BP Location: Left Arm)   Pulse 70   Temp 98.5 F (36.9 C) (Oral)   Resp (!) 24   LMP  (LMP Unknown)   SpO2 95%   Visual Acuity Right Eye Distance:   Left Eye Distance:   Bilateral Distance:    Right Eye Near:   Left Eye Near:    Bilateral Near:     Physical Exam Vitals and nursing note reviewed.  Constitutional:      General: She is not in acute distress.    Appearance: Normal appearance. She is not ill-appearing, toxic-appearing or diaphoretic.  HENT:     Head: Normocephalic.  Eyes:     Conjunctiva/sclera: Conjunctivae normal.  Cardiovascular:     Pulses:          Dorsalis pedis pulses are 1+ on the right side and 1+ on the left side.  Pulmonary:     Effort: Pulmonary effort is normal.  Musculoskeletal:        General: Normal range of motion.     Cervical back: Normal range of motion.     Comments: Pain to the great toe and MTP joint, mild erythema and swelling. No pain to the right ankle, calf, or shin. Blisters noted.   Skin:    General: Skin is warm and dry.     Findings: No rash.     Comments: See picture for detail.    Neurological:     Mental  Status: She is alert.  Psychiatric:        Mood and Affect: Mood normal.          UC Treatments / Results  Labs (all labs ordered are listed, but only abnormal results are displayed) Labs Reviewed - No data to display  EKG   Radiology No results found.  Procedures Procedures (including critical care time)  Medications Ordered in UC Medications - No data to display  Initial Impression / Assessment and Plan / UC Course  I have reviewed the triage vital signs and the nursing notes.  Pertinent labs & imaging results that were available during my care of the patient were reviewed by me and considered in my medical decision making (see chart for details).     Foot pain and blisters to the RLE I believe this is most likely gout. No concern for infection at this time. I believe that the blisters are unrelated to the foot pain.  Pt also has chronic pain in the feet due to neuropathy. Do not believe this to be neuropathy.  Treating with low dose prednisone x 3 days due to stage IV kidney disease.  Recommended to let the blisters resolve on their own and to not open them to avoid infection.  Follow up with PCP next week for recheck.  May return here for any acute issues or worsening pain 15  minutes spent reviewing phone notes and hx  Final Clinical Impressions(s) / UC Diagnoses   Final diagnoses:  Foot pain, right  Blister     Discharge Instructions     Treating you for gout. I  do not think the blisters are related to the foot pain.  Please follow up with your doctor next week     ED Prescriptions    Medication Sig Dispense Auth. Provider   predniSONE (DELTASONE) 10 MG tablet Take 2 tablets (20 mg total) by mouth daily for 3 days. 6 tablet Loura Halt A, NP     PDMP not reviewed this encounter.   Orvan July, NP 09/23/20 0848    Orvan July, NP 09/23/20 [phone]

## 2020-09-22 NOTE — Telephone Encounter (Signed)
Please call [facility] to make an follow up appointment ASAP to make sure this blister goes away  For now avoid compression sock and wear the boots   Foot and toe could be neuropathy but you also have peripheral vascular disease which can cause pain  To diagnosis neuropathy you would need to see neurology  -do you want a referral ?     This MyChart message has not been read.  Thressa Sheller, CMA to Me      09/22/20 11:40 AM Please advise    Vickie Kemp, Vickie "[name]" to Me      09/22/20 11:13 AM Dr. Linus Orn, I reached out to [name] a couple of weeks ago about a water blister on my right leg.  To add to that I am experiencing foot and toe pain more in the right than left.  Yes, I have been wearing the compression sock, though I have not been able to get them on this morning yet. Also, I have just received the Pump Boots that Dr. Delana Meyer had recommended for the swelling.  The feet and toes are very painful from time to time, could it be Neuropathy or side effect from [place]?  Thank you. Vickie Kemp

## 2020-09-23 ENCOUNTER — Encounter (INDEPENDENT_AMBULATORY_CARE_PROVIDER_SITE_OTHER): Payer: Self-pay

## 2020-09-24 ENCOUNTER — Encounter: Payer: Self-pay | Admitting: Internal Medicine

## 2020-09-24 ENCOUNTER — Encounter (INDEPENDENT_AMBULATORY_CARE_PROVIDER_SITE_OTHER): Payer: Self-pay

## 2020-09-24 ENCOUNTER — Other Ambulatory Visit: Payer: Self-pay

## 2020-09-24 ENCOUNTER — Ambulatory Visit (INDEPENDENT_AMBULATORY_CARE_PROVIDER_SITE_OTHER): Payer: Medicare Other | Admitting: Nurse Practitioner

## 2020-09-24 VITALS — BP 161/75 | HR 67 | Resp 16 | Wt 198.0 lb

## 2020-09-24 DIAGNOSIS — L97909 Non-pressure chronic ulcer of unspecified part of unspecified lower leg with unspecified severity: Secondary | ICD-10-CM

## 2020-09-24 DIAGNOSIS — I83009 Varicose veins of unspecified lower extremity with ulcer of unspecified site: Secondary | ICD-10-CM

## 2020-09-24 NOTE — Progress Notes (Signed)
History of Present Illness  There is no documented history at this time  Assessments & Plan   There are no diagnoses linked to this encounter.    Additional instructions  Subjective:  Patient presents with venous ulcer of the Right lower extremity.    Procedure:  3 layer unna wrap was placed Right lower extremity.   Plan:   Follow up in one week.   

## 2020-10-01 ENCOUNTER — Ambulatory Visit (INDEPENDENT_AMBULATORY_CARE_PROVIDER_SITE_OTHER): Payer: Medicare Other | Admitting: Nurse Practitioner

## 2020-10-01 ENCOUNTER — Other Ambulatory Visit: Payer: Self-pay

## 2020-10-01 VITALS — BP 121/75 | HR 66 | Ht 64.0 in | Wt 195.0 lb

## 2020-10-01 DIAGNOSIS — I83009 Varicose veins of unspecified lower extremity with ulcer of unspecified site: Secondary | ICD-10-CM

## 2020-10-01 DIAGNOSIS — L97909 Non-pressure chronic ulcer of unspecified part of unspecified lower leg with unspecified severity: Secondary | ICD-10-CM | POA: Diagnosis not present

## 2020-10-01 NOTE — Progress Notes (Signed)
History of Present Illness  There is no documented history at this time  Assessments & Plan   There are no diagnoses linked to this encounter.    Additional instructions  Subjective:  Patient presents with venous ulcer of the Right lower extremity.    Procedure:  3 layer unna wrap was placed Right lower extremity.   Plan:   Follow up in one week.   

## 2020-10-03 ENCOUNTER — Encounter: Payer: Self-pay | Admitting: Internal Medicine

## 2020-10-03 ENCOUNTER — Encounter (INDEPENDENT_AMBULATORY_CARE_PROVIDER_SITE_OTHER): Payer: Self-pay | Admitting: Nurse Practitioner

## 2020-10-04 ENCOUNTER — Ambulatory Visit: Payer: Medicare Other | Admitting: Pharmacist

## 2020-10-04 DIAGNOSIS — E782 Mixed hyperlipidemia: Secondary | ICD-10-CM

## 2020-10-04 DIAGNOSIS — E1159 Type 2 diabetes mellitus with other circulatory complications: Secondary | ICD-10-CM

## 2020-10-04 DIAGNOSIS — I10 Essential (primary) hypertension: Secondary | ICD-10-CM

## 2020-10-04 DIAGNOSIS — I25119 Atherosclerotic heart disease of native coronary artery with unspecified angina pectoris: Secondary | ICD-10-CM

## 2020-10-04 NOTE — Chronic Care Management (AMB) (Signed)
Chronic Care Management Pharmacy Note  10/04/2020 Name:  Vickie Kemp MRN:  [id number] DOB:  11-05-1940  Subjective: Vickie Kemp is an 80 y.o. year old female who is a primary patient of McLean-Scocuzza, Nino Glow, MD.  The CCM team was consulted for assistance with disease management and care coordination needs.    Engaged with patient via her preferred method of communication of MyChart for follow up on medication management devices in response to provider referral for pharmacy case management and/or care coordination services.   Consent to Services:  The patient was given information about Chronic Care Management services, agreed to services, and gave verbal consent prior to initiation of services.  Please see initial visit note for detailed documentation.   Objective:  Lab Results  Component Value Date   CREATININE 1.60 (H) 06/02/2020   CREATININE 1.55 (H) 04/24/2020   CREATININE 1.49 (H) 03/19/2020    Lab Results  Component Value Date   HGBA1C 9.3 (H) 06/16/2020       Component Value Date/Time   CHOL 106 06/16/2020 0928   TRIG 70.0 06/16/2020 0928   HDL 29.50 (L) 06/16/2020 0928   CHOLHDL 4 06/16/2020 0928   VLDL 14.0 06/16/2020 0928   LDLCALC 62 06/16/2020 0928    BP Readings from Last 3 Encounters:  10/01/20 121/75  09/24/20 (!) 161/75  09/22/20 (!) 153/77    Assessment: Review of patient past medical history, allergies, medications, health status, including review of consultants reports, laboratory and other test data, was performed as part of comprehensive evaluation and provision of chronic care management services.   SDOH:  (Social Determinants of Health) assessments and interventions performed:  SDOH Interventions   Flowsheet Row Most Recent Value  SDOH Interventions   Financial Strain Interventions Intervention Not Indicated      CCM Care Plan  Allergies  Allergen Reactions  . Amoxicillin Itching    Medications Reviewed Today     Reviewed by Kris Hartmann, NP (Nurse Practitioner) on 10/03/20 at 1531  Med List Status: <None>  Medication Order Taking? Sig Documenting Provider Last Dose Status Informant  acetaminophen (TYLENOL) 650 MG CR tablet [id number] No Take 650 mg by mouth 3 (three) times daily as needed. [provider] Taking Active   albuterol (VENTOLIN HFA) 108 (90 Base) MCG/ACT inhaler [id number] No Inhale 1-2 puffs into the lungs every 6 (six) hours as needed for wheezing or shortness of breath. Martyn Ehrich, NP Taking Active   amLODipine (NORVASC) 5 MG tablet [id number] No Take 1 tablet (5 mg total) by mouth daily. Kate Sable, MD Taking Active   aspirin EC 81 MG tablet [id number] No Take 81 mg by mouth daily. [provider] Taking Active   atorvastatin (LIPITOR) 40 MG tablet [id number] No Take 1 tablet (40 mg total) by mouth daily. Kate Sable, MD Taking Active   carvedilol (COREG) 25 MG tablet [id number] No Take 1 tablet (25 mg total) by mouth 2 (two) times daily. Kate Sable, MD Taking Active   dicyclomine (BENTYL) 10 MG capsule [id number] No Take 1 capsule (10 mg total) by mouth 4 (four) times daily -  before meals and at bedtime. [name], MD Taking Active   furosemide (LASIX) 40 MG tablet [id number] No Take 1 tablet (40 mg total) by mouth in the morning and at bedtime. Kate Sable, MD Taking Active   Glucose Blood (BLOOD GLUCOSE TEST STRIPS) STRP [id number] No E11.9 check sugar tid for designated machine McLean-Scocuzza, Nino Glow, MD Taking Active  insulin degludec (TRESIBA FLEXTOUCH) 200 UNIT/ML FlexTouch Pen [id number] No Inject 46 Units into the skin daily. McLean-Scocuzza, Nino Glow, MD Taking Active   insulin lispro (HUMALOG KWIKPEN) 100 UNIT/ML KwikPen [id number] No Prn based on sugar If sugar 70-130 0 units If sugar 131-180 4 units If sugar 181-240 8 units If sugar 241-300 10 units If sugar 301-350 12 units If sugar 351-400 16 units If >400 20 units and  call doctor McLean-Scocuzza, Nino Glow, MD Taking Active   Insulin Pen Needle (PEN NEEDLES) 31G X 8 MM MISC [id number] No 1 pen by Does not apply route 2 (two) times daily as needed. McLean-Scocuzza, Nino Glow, MD Taking Active   Insulin Pen Needle 32G X 4 MM MISC [id number] No 1 Device by Does not apply route 4 (four) times daily - after meals and at bedtime. McLean-Scocuzza, Nino Glow, MD Taking Active   latanoprost (XALATAN) 0.005 % ophthalmic solution [id number] No Place 1 drop into the left eye at bedtime. [provider] Taking Active   losartan (COZAAR) 100 MG tablet [id number] No Take 100 mg by mouth daily. [provider] Taking Active   Turmeric (QC TUMERIC COMPLEX PO) [id number] No Take by mouth.  [provider] Taking Active           Patient Active Problem List   Diagnosis Date Noted  . Shortness of breath 09/14/2020  . Chronic venous insufficiency 08/02/2020  . Chronic diarrhea of unknown origin   . Lymphedema 05/18/2020  . Benign hypertensive kidney disease with chronic kidney disease 05/03/2020  . Chronic kidney disease, stage IV (severe) ([place]) 05/03/2020  . Diabetes mellitus ([place]) 05/03/2020  . OSA on CPAP 02/29/2020  . Hypertension associated with diabetes ([place]) 01/20/2020  . Memory loss 01/20/2020  . Tremor 01/20/2020  . Essential hypertension 01/20/2020  . Left lumbar radiculopathy 12/26/2019  . At risk for obstructive sleep apnea 11/21/2019  . Allergic rhinitis 11/21/2019  . Cystitis 10/17/2019  . PVD (peripheral vascular disease) ([place]) 10/02/2019  . Stage 3b chronic kidney disease ([place]) 09/30/2019  . Lumbar radiculopathy 09/30/2019  . Cervicalgia 09/30/2019  . CKD (chronic kidney disease) stage 4, GFR 15-29 ml/min (HCC) 09/30/2019  . Arthritis 09/30/2019  . Chronic maxillary sinusitis 09/30/2019  . Venous stasis 08/28/2019  . Secondary hyperparathyroidism of renal origin ([place]) 07/29/2019  . Type 2 diabetes mellitus with diabetic chronic  kidney disease ([place]) 06/10/2019  . Pain due to onychomycosis of toenails of both feet 05/01/2019  . Type 2 diabetes mellitus with vascular disease ([place]) 05/01/2019  . Chronic arthropathy 05/01/2019  . Obesity 01/21/2018  . CAD (coronary artery disease) 06/04/2017  . Hyperlipemia 06/04/2017  . Insulin dependent diabetes mellitus 06/04/2017  . S/P PTCA (percutaneous transluminal coronary angioplasty) 06/04/2017  . Palpitations 06/04/2017  . Essential (primary) hypertension 06/04/2015    Conditions to be addressed/monitored: CAD, HTN, HLD and DMII  Care Plan : Medication Management  Updates made by De Hollingshead, RPH-CPP since 10/04/2020 12:00 AM    Problem: Diabetes, CAD, PVD, HTN     Long-Range Goal: Disease Progression Prevention   Start Date: 06/18/2020  Expected End Date: 09/18/2020  Recent Progress: On track  Priority: High  Note:   Current Barriers:  . Unable to achieve control of diabetes   Pharmacist Clinical Goal(s):  [name] Over the next 90 days, patient will improve control of diabetes through collaboration with PharmD and provider.   Interventions: . 1:1 collaboration with McLean-Scocuzza, Nino Glow, MD regarding development and update of comprehensive  plan of care as evidenced by provider attestation and co-signature . Inter-disciplinary care team collaboration (see longitudinal plan of care) . Comprehensive medication review performed; medication list updated in electronic medical record  Health Maintenance: . Has been working extensively w/ vein & vascular and wound care. Upcoming podiatry appointment  Diabetes: . Uncontrolled; current treatment: Tresiba 50 units daily, Humalog 5 units with lunch and supper  o Renal function limits use of metformin . Current glucose readings: utilizing Libre CGM for the past 4 weeks. Confident in placing sensors Q14 days . Encouraged continued use of CGM to evaluate glycemic control and evaluate for hypoglycemia. . Could consider  use of GLP1 and/or SGLT2 in the future, though would be wary of exacerbation of GI symptoms w/ GLP1 and to ensure that eGFR remains appropriate for initiation of SGLT2. Continue to follow.   Hypertension in the setting of CKD: . Controlled; current treatment: amlodipine 5 mg daily QPM, losartan 100 mg QAM, carvedilol 25 mg BID, furosemide 40 mg BID . Previously recommend to continue current regimen at this time.  . Could consider use of SGLT2 moving forward, pending maintenance of appropriate renal function for initiation.   Hyperlipidemia, secondary ASCVD prevention (CAD, MI 2014, PVD) . Controlled per last lipid panel; current treatment: atorvastatin 40 mg daily . Antiplatelet regimen: aspirin 81 mg daily . Previously recommend to continue current regimen at this time  Patient Goals/Self-Care Activities . Over the next 90 days, patient will:  - take medications as prescribed check glucose three times daily using CGM, document, and provide at future appointments  Follow Up Plan: Face to Face appointment with care management team member scheduled for:  ~2 weeks as previously scheduled      Medication Assistance: None required.  Patient affirms current coverage meets needs.  Follow Up:  Patient agrees to Care Plan and Follow-up.  Plan: Telephone follow up appointment with care management team member scheduled for:  ~2 weeks as previously scheduled  [name], PharmD, [place], [place] Clinical Pharmacist [employer] at [facility] [phone]

## 2020-10-04 NOTE — Patient Instructions (Signed)
Visit Information  PATIENT GOALS: Goals Addressed              This Visit's Progress     Patient Stated   .  Medication Monitoring (pt-stated)        Patient Goals/Self-Care Activities . Over the next 90 days, patient will:  - take medications as prescribed check glucose TID using CGM, document, and provide at future appointments       Patient verbalizes understanding of instructions provided today and agrees to view in [place].   Plan: Telephone follow up appointment with care management team member scheduled for:  ~2 weeks as previously scheduled  [name], PharmD, [place], [place] Clinical Pharmacist [employer] at [facility] [phone]

## 2020-10-07 DIAGNOSIS — E1165 Type 2 diabetes mellitus with hyperglycemia: Secondary | ICD-10-CM | POA: Diagnosis not present

## 2020-10-08 ENCOUNTER — Encounter: Payer: Self-pay | Admitting: Podiatry

## 2020-10-08 ENCOUNTER — Encounter (INDEPENDENT_AMBULATORY_CARE_PROVIDER_SITE_OTHER): Payer: Self-pay

## 2020-10-08 ENCOUNTER — Other Ambulatory Visit: Payer: Self-pay

## 2020-10-08 ENCOUNTER — Ambulatory Visit (INDEPENDENT_AMBULATORY_CARE_PROVIDER_SITE_OTHER): Payer: Medicare Other | Admitting: Nurse Practitioner

## 2020-10-08 VITALS — BP 136/68 | HR 76 | Resp 16 | Wt 197.7 lb

## 2020-10-08 DIAGNOSIS — L97909 Non-pressure chronic ulcer of unspecified part of unspecified lower leg with unspecified severity: Secondary | ICD-10-CM | POA: Diagnosis not present

## 2020-10-08 DIAGNOSIS — I83009 Varicose veins of unspecified lower extremity with ulcer of unspecified site: Secondary | ICD-10-CM | POA: Diagnosis not present

## 2020-10-08 NOTE — Progress Notes (Signed)
History of Present Illness  There is no documented history at this time  Assessments & Plan   There are no diagnoses linked to this encounter.    Additional instructions  Subjective:  Patient presents with venous ulcer of the Right lower extremity.    Procedure:  3 layer unna wrap was placed Right lower extremity.   Plan:   Follow up in one week.   

## 2020-10-11 ENCOUNTER — Telehealth: Payer: Self-pay | Admitting: *Deleted

## 2020-10-11 DIAGNOSIS — G4733 Obstructive sleep apnea (adult) (pediatric): Secondary | ICD-10-CM | POA: Diagnosis not present

## 2020-10-11 NOTE — Telephone Encounter (Signed)
Called patient and informed that per Dr Prudence Davidson it is ok to have nails trimmed w/ Unna boot on as long as toes are exposed. She verbalized understanding ,stated that boot was applied by vein and vascular and will keep upcoming appointment.

## 2020-10-14 ENCOUNTER — Other Ambulatory Visit: Payer: Self-pay

## 2020-10-14 ENCOUNTER — Encounter: Payer: Self-pay | Admitting: Podiatry

## 2020-10-14 ENCOUNTER — Ambulatory Visit: Payer: Medicare Other | Admitting: Podiatry

## 2020-10-14 DIAGNOSIS — B351 Tinea unguium: Secondary | ICD-10-CM | POA: Diagnosis not present

## 2020-10-14 DIAGNOSIS — M79675 Pain in left toe(s): Secondary | ICD-10-CM | POA: Diagnosis not present

## 2020-10-14 DIAGNOSIS — E1159 Type 2 diabetes mellitus with other circulatory complications: Secondary | ICD-10-CM

## 2020-10-14 DIAGNOSIS — I878 Other specified disorders of veins: Secondary | ICD-10-CM

## 2020-10-14 DIAGNOSIS — M79674 Pain in right toe(s): Secondary | ICD-10-CM

## 2020-10-14 NOTE — Progress Notes (Signed)
This patient returns to my office for at risk foot care.  This patient requires this care by a professional since this patient will be at risk due to having diabetes mellitus and venous stasis. Patient is wearing an unna boot right leg.  This patient is unable to cut nails herself since the patient cannot reach her nails.These nails are painful walking and wearing shoes.  This patient presents for at risk foot care today.  General Appearance  Alert, conversant and in no acute stress.  Vascular  Dorsalis pedis and posterior tibial  pulses are not  palpable  bilaterally.  Capillary return is within normal limits  bilaterally. Cold feet bilaterally. Absent digital hair  Bilateral.  Neurologic  Senn-Weinstein monofilament wire test within normal limits  bilaterally. Muscle power within normal limits bilaterally.  Nails Thick disfigured discolored nails with subungual debris  from hallux to fifth toes bilaterally. No evidence of bacterial infection or drainage bilaterally.  Orthopedic  No limitations of motion  feet .  No crepitus or effusions noted.  No bony pathology or digital deformities noted.  Skin  normotropic skin with no porokeratosis noted bilaterally.  No signs of infections or ulcers noted.     Onychomycosis  Pain in right toes  Pain in left toes  Consent was obtained for treatment procedures.   Mechanical debridement of nails 1-5  bilaterally performed with a nail nipper.  Filed with dremel without incident.    Return office visit    prn                 Told patient to return for periodic foot care and evaluation due to potential at risk complications.   Gardiner Barefoot DPM

## 2020-10-15 ENCOUNTER — Ambulatory Visit (INDEPENDENT_AMBULATORY_CARE_PROVIDER_SITE_OTHER): Payer: Medicare Other | Admitting: Nurse Practitioner

## 2020-10-15 ENCOUNTER — Encounter (INDEPENDENT_AMBULATORY_CARE_PROVIDER_SITE_OTHER): Payer: Self-pay | Admitting: Nurse Practitioner

## 2020-10-15 ENCOUNTER — Ambulatory Visit (INDEPENDENT_AMBULATORY_CARE_PROVIDER_SITE_OTHER): Payer: Medicare Other | Admitting: Pharmacist

## 2020-10-15 VITALS — BP 116/63 | HR 63 | Ht 64.0 in | Wt 197.0 lb

## 2020-10-15 DIAGNOSIS — E782 Mixed hyperlipidemia: Secondary | ICD-10-CM

## 2020-10-15 DIAGNOSIS — I25119 Atherosclerotic heart disease of native coronary artery with unspecified angina pectoris: Secondary | ICD-10-CM | POA: Diagnosis not present

## 2020-10-15 DIAGNOSIS — E1159 Type 2 diabetes mellitus with other circulatory complications: Secondary | ICD-10-CM

## 2020-10-15 DIAGNOSIS — I83009 Varicose veins of unspecified lower extremity with ulcer of unspecified site: Secondary | ICD-10-CM | POA: Diagnosis not present

## 2020-10-15 DIAGNOSIS — L97909 Non-pressure chronic ulcer of unspecified part of unspecified lower leg with unspecified severity: Secondary | ICD-10-CM | POA: Diagnosis not present

## 2020-10-15 DIAGNOSIS — I10 Essential (primary) hypertension: Secondary | ICD-10-CM | POA: Diagnosis not present

## 2020-10-15 DIAGNOSIS — I890 Lymphedema, not elsewhere classified: Secondary | ICD-10-CM

## 2020-10-15 MED ORDER — TRESIBA FLEXTOUCH 200 UNIT/ML ~~LOC~~ SOPN: 40.0000 [IU] | PEN_INJECTOR | Freq: Every day | SUBCUTANEOUS | 3 refills | Status: DC

## 2020-10-15 NOTE — Patient Instructions (Addendum)
DECREASE TRESIBA TO 40 UNITS DAILY.   Call me if you continue to have readings less than 70.   Continue Humalog 5 units with lunch and with supper.   You can cut back on the snacking before bed!  Continue scanning as frequently as you are.  [name], PharmD [phone]  Visit Information  PATIENT GOALS: Goals Addressed              This Visit's Progress     Patient Stated   .  Medication Monitoring (pt-stated)        Patient Goals/Self-Care Activities . Over the next 90 days, patient will:  - take medications as prescribed check glucose at least three times daily using CGM, document, and provide at future appointments       Print copy of patient instructions, educational materials, and care plan provided in person.  Plan: Face to Face appointment with care management team member scheduled for: ~ 8 weeks (recommended 4 weeks but patient preferred 8)  [name], PharmD, [place], [place] Clinical Pharmacist [employer] at [employer] [phone]

## 2020-10-15 NOTE — Progress Notes (Addendum)
Chronic Care Management Pharmacy Note  10/15/2020 Name:  Vickie Kemp MRN:  [id number] DOB:  April 02, 1941  Subjective: Vickie Kemp is an 80 y.o. year old female who is a primary patient of McLean-Scocuzza, Nino Glow, MD.  The CCM team was consulted for assistance with disease management and care coordination needs.    Engaged with patient face to face for follow up visit in response to provider referral for pharmacy case management and/or care coordination services.   Consent to Services:  The patient was given information about Chronic Care Management services, agreed to services, and gave verbal consent prior to initiation of services.  Please see initial visit note for detailed documentation.   Patient Care Team: McLean-Scocuzza, Nino Glow, MD as PCP - General (Internal Medicine) Kate Sable, MD as PCP - Cardiology (Cardiology) De Hollingshead, RPH-CPP (Pharmacist)  Recent office visits:  None since our last call  Recent consult visits:  3/3 - podiatry [facility] visits: None in previous 6 months  Objective:  Lab Results  Component Value Date   CREATININE 1.60 (H) 06/02/2020   BUN 26 (H) 06/02/2020   GFR 32.81 (L) 01/15/2020   GFRNONAA 31 (L) 06/02/2020   GFRAA 37 (L) 04/24/2020   NA 142 06/02/2020   K 4.4 06/02/2020   CALCIUM 9.2 06/02/2020   CO2 29 06/02/2020    Lab Results  Component Value Date/Time   HGBA1C 9.3 (H) 06/16/2020 09:28 AM   HGBA1C 9.9 (H) 01/15/2020 10:23 AM   GFR 32.81 (L) 01/15/2020 10:23 AM   GFR 29.72 (L) 04/28/2019 02:08 PM   MICROALBUR <0.7 06/16/2020 09:28 AM    Last diabetic Eye exam:  Lab Results  Component Value Date/Time   HMDIABEYEEXA Retinopathy (A) 04/20/2020 12:00 AM    Last diabetic Foot exam: No results found for: HMDIABFOOTEX   Lab Results  Component Value Date   CHOL 106 06/16/2020   HDL 29.50 (L) 06/16/2020   LDLCALC 62 06/16/2020   TRIG 70.0 06/16/2020   CHOLHDL 4 06/16/2020    Hepatic  Function Latest Ref Rng & Units 06/16/2020 01/15/2020 10/15/2019  Total Protein 6.0 - 8.3 g/dL 7.5 6.8 7.2  Albumin 3.5 - 5.2 g/dL 4.2 3.9 4.0  AST 0 - 37 U/L _0 ALT 0 - 35 U/L _1 Alk Phosphatase 39 - 117 U/L 117 86 111  Total Bilirubin 0.2 - 1.2 mg/dL 0.4 0.3 0.5  Bilirubin, Direct 0.0 - 0.3 mg/dL 0.1 - 0.1    Lab Results  Component Value Date/Time   TSH 2.12 06/16/2020 09:28 AM   TSH 1.15 04/28/2019 02:08 PM    CBC Latest Ref Rng & Units 06/16/2020 04/24/2020 01/15/2020  WBC 4.0 - 10.5 K/uL 8.3 12.3(H) 8.0  Hemoglobin 12.0 - 15.0 g/dL 12.2 12.0 10.9(L)  Hematocrit 36.0 - 46.0 % 38.0 36.9 32.8(L)  Platelets 150.0 - 400.0 K/uL 202.0 200 305.0    Lab Results  Component Value Date/Time   VD25OH 54.23 10/15/2019 09:13 AM    Clinical ASCVD: No    Depression screen [facility] 2/9 02/12/2020 11/25/2019 09/30/2019  Decreased Interest 0 0 0  Down, Depressed, Hopeless 0 0 0  PHQ - 2 Score 0 0 0  Altered sleeping - - -  Tired, decreased energy - - -  Change in appetite - - -  Feeling bad or failure about yourself  - - -  Trouble concentrating - - -  Moving slowly or fidgety/restless - - -  Suicidal  thoughts - - -  PHQ-9 Score - - -  Difficult doing work/chores - - -     Social History   Tobacco Use  Smoking Status Never Smoker  Smokeless Tobacco Never Used   BP Readings from Last 3 Encounters:  10/08/20 136/68  10/01/20 121/75  09/24/20 (!) 161/75   Pulse Readings from Last 3 Encounters:  10/08/20 76  10/01/20 66  09/24/20 67   Wt Readings from Last 3 Encounters:  10/08/20 197 lb 11.2 oz (89.7 kg)  10/01/20 195 lb (88.5 kg)  09/24/20 198 lb (89.8 kg)     Assessment/Interventions: Review of patient past medical history, allergies, medications, health status, including review of consultants reports, laboratory and other test data, was performed as part of comprehensive evaluation and provision of chronic care management services.   SDOH:  (Social Determinants of  Health) assessments and interventions performed: Yes SDOH Interventions   Flowsheet Row Most Recent Value  SDOH Interventions   Financial Strain Interventions Intervention Not Indicated      CCM Care Plan  Allergies  Allergen Reactions  . Amoxicillin Itching    Medications Reviewed Today    Reviewed by Gardiner Barefoot, DPM (Physician) on 10/14/20 at 1325  Med List Status: <None>  Medication Order Taking? Sig Documenting Provider Last Dose Status Informant  acetaminophen (TYLENOL) 650 MG CR tablet [id number] No Take 650 mg by mouth 3 (three) times daily as needed. [provider] Taking Active   albuterol (VENTOLIN HFA) 108 (90 Base) MCG/ACT inhaler [id number] No Inhale 1-2 puffs into the lungs every 6 (six) hours as needed for wheezing or shortness of breath. Martyn Ehrich, NP Taking Active   amLODipine (NORVASC) 5 MG tablet [id number] No Take 1 tablet (5 mg total) by mouth daily. Kate Sable, MD Taking Active   aspirin EC 81 MG tablet [id number] No Take 81 mg by mouth daily. [provider] Taking Active   atorvastatin (LIPITOR) 40 MG tablet [id number] No Take 1 tablet (40 mg total) by mouth daily. Kate Sable, MD Taking Active   carvedilol (COREG) 25 MG tablet [id number] No Take 1 tablet (25 mg total) by mouth 2 (two) times daily. Kate Sable, MD Taking Active   dicyclomine (BENTYL) 10 MG capsule [id number] No Take 1 capsule (10 mg total) by mouth 4 (four) times daily -  before meals and at bedtime. [name], MD Taking Active   furosemide (LASIX) 40 MG tablet [id number] No Take 1 tablet (40 mg total) by mouth in the morning and at bedtime. Kate Sable, MD Taking Active   Glucose Blood (BLOOD GLUCOSE TEST STRIPS) STRP [id number] No E11.9 check sugar tid for designated machine McLean-Scocuzza, Nino Glow, MD Taking Active   insulin degludec (TRESIBA FLEXTOUCH) 200 UNIT/ML FlexTouch Pen [id number] No Inject 46 Units into the skin daily.  McLean-Scocuzza, Nino Glow, MD Taking Active   insulin lispro (HUMALOG KWIKPEN) 100 UNIT/ML KwikPen [id number] No Prn based on sugar If sugar 70-130 0 units If sugar 131-180 4 units If sugar 181-240 8 units If sugar 241-300 10 units If sugar 301-350 12 units If sugar 351-400 16 units If >400 20 units and call doctor McLean-Scocuzza, Nino Glow, MD Taking Active   Insulin Pen Needle (PEN NEEDLES) 31G X 8 MM MISC [id number] No 1 pen by Does not apply route 2 (two) times daily as needed. McLean-Scocuzza, Nino Glow, MD Taking Active   Insulin Pen Needle 32G X 4 MM MISC [id number] No 1 Device by Does not apply route  4 (four) times daily - after meals and at bedtime. McLean-Scocuzza, Nino Glow, MD Taking Active   latanoprost (XALATAN) 0.005 % ophthalmic solution [id number] No Place 1 drop into the left eye at bedtime. [provider] Taking Active   losartan (COZAAR) 100 MG tablet [id number] No Take 100 mg by mouth daily. [provider] Taking Active   Turmeric (QC TUMERIC COMPLEX PO) [id number] No Take by mouth.  [provider] Taking Active           Patient Active Problem List   Diagnosis Date Noted  . Shortness of breath 09/14/2020  . Chronic venous insufficiency 08/02/2020  . Chronic diarrhea of unknown origin   . Lymphedema 05/18/2020  . Benign hypertensive kidney disease with chronic kidney disease 05/03/2020  . Chronic kidney disease, stage IV (severe) ([place]) 05/03/2020  . Diabetes mellitus ([place]) 05/03/2020  . OSA on CPAP 02/29/2020  . Hypertension associated with diabetes ([place]) 01/20/2020  . Memory loss 01/20/2020  . Tremor 01/20/2020  . Essential hypertension 01/20/2020  . Left lumbar radiculopathy 12/26/2019  . At risk for obstructive sleep apnea 11/21/2019  . Allergic rhinitis 11/21/2019  . Cystitis 10/17/2019  . PVD (peripheral vascular disease) ([place]) 10/02/2019  . Stage 3b chronic kidney disease ([place]) 09/30/2019  . Lumbar radiculopathy 09/30/2019  . Cervicalgia  09/30/2019  . CKD (chronic kidney disease) stage 4, GFR 15-29 ml/min (HCC) 09/30/2019  . Arthritis 09/30/2019  . Chronic maxillary sinusitis 09/30/2019  . Venous stasis 08/28/2019  . Secondary hyperparathyroidism of renal origin ([place]) 07/29/2019  . Type 2 diabetes mellitus with diabetic chronic kidney disease ([place]) 06/10/2019  . Pain due to onychomycosis of toenails of both feet 05/01/2019  . Type 2 diabetes mellitus with vascular disease ([place]) 05/01/2019  . Chronic arthropathy 05/01/2019  . Obesity 01/21/2018  . CAD (coronary artery disease) 06/04/2017  . Hyperlipemia 06/04/2017  . Insulin dependent diabetes mellitus 06/04/2017  . S/P PTCA (percutaneous transluminal coronary angioplasty) 06/04/2017  . Palpitations 06/04/2017  . Essential (primary) hypertension 06/04/2015    Immunization History  Administered Date(s) Administered  . Fluad Quad(high Dose 65+) 04/28/2019, 05/14/2020  . Influenza, High Dose Seasonal PF 06/04/2017, 06/07/2018  . PFIZER(Purple Top)SARS-COV-2 Vaccination 10/08/2019, 10/29/2019, 05/14/2020  . Pneumococcal Polysaccharide-23 05/05/2013    Conditions to be addressed/monitored:  Hypertension, Hyperlipidemia and Diabetes  Care Plan : Medication Management  Updates made by De Hollingshead, RPH-CPP since 10/15/2020 12:00 AM    Problem: Diabetes, CAD, PVD, HTN     Long-Range Goal: Disease Progression Prevention   Start Date: 06/18/2020  Expected End Date: 09/18/2020  Recent Progress: On track  Priority: High  Note:   Current Barriers:  . Unable to achieve control of diabetes   Pharmacist Clinical Goal(s):  [name] Over the next 90 days, patient will improve control of diabetes through collaboration with PharmD and provider.   Interventions: . 1:1 collaboration with McLean-Scocuzza, Nino Glow, MD regarding development and update of comprehensive plan of care as evidenced by provider attestation and co-signature . Inter-disciplinary care team collaboration  (see longitudinal plan of care) . Comprehensive medication review performed; medication list updated in electronic medical record  Diabetes: . Uncontrolled; current treatment: Tresiba 46 units daily, Humalog 5 units with lunch and supper  o Renal function limits use of metformin . Current glucose readings: Date of Download: 2/19-10/15/20 % Time CGM is active: 98% Average Glucose: 123 mg/dL Glucose Management Indicator: 6.3  Glucose Variability: 36.3 (goal <36%) Time in Goal:  - Time in range 70-180:  82% - Time above range: 12% - Time below range: 6% Observed patterns: fasting hypoglycemia, with significant elevations before bed. Post lunch/post supper readings remain generally <180 . Current meal patterns: due to early morning hypoglycemia, patient has been eating peanut butter crackers and apple juice before bed to try to mitigate.  . Reduce Tresiba to 40 units daily to reduce risk of hypoglycemia. Continue Humalog 5 units with lunch and supper. Encouraged patient to contact us if she continues to have episodes of hypoglycemia and we can further reduce basal insulin.  . Stop bedtime snacking to prevent overnight hyperglycemia . Could consider use of GLP1 and/or SGLT2 in the future, though would be wary of exacerbation of GI symptoms w/ GLP1 and to ensure that eGFR remains appropriate for initiation of SGLT2. Continue to follow.   Hypertension in the setting of CKD: . Controlled; current treatment: amlodipine 5 mg daily QPM, losartan 100 mg QAM, carvedilol 25 mg BID, furosemide 40 mg BID . Previously recommend to continue current regimen at this time.  . Could consider use of SGLT2 moving forward, pending maintenance of appropriate renal function for initiation.   Hyperlipidemia, secondary ASCVD prevention (CAD, MI 2014, PVD) . Controlled per last lipid panel; current treatment: atorvastatin 40 mg daily . Antiplatelet regimen: aspirin 81 mg daily . Previously recommend to continue current  regimen at this time  Patient Goals/Self-Care Activities . Over the next 90 days, patient will:  - take medications as prescribed check glucose three times daily using CGM, document, and provide at future appointments  Follow Up Plan: Face to Face appointment with care management team member scheduled for:  discussed ~ 4 weeks, but patient prefers ~ 8 weeks       Medication Assistance: None required.  Patient affirms current coverage meets needs.  Patient's preferred pharmacy is:  [facility] [place], [place] - [place] [address] [place] [address] [place] [address] Phone: [phone] Fax: [phone]   Care Plan and Follow Up Patient Decision:  Patient agrees to Care Plan and Follow-up.  Plan: Face to Face appointment with care management team member scheduled for: ~ 8 weeks (recommended 4 weeks but patient preferred 8)  [name], PharmD, [place], [place] Clinical Pharmacist [employer] [facility] [employer] [phone]

## 2020-10-16 ENCOUNTER — Encounter: Payer: Self-pay | Admitting: Internal Medicine

## 2020-10-21 ENCOUNTER — Encounter (INDEPENDENT_AMBULATORY_CARE_PROVIDER_SITE_OTHER): Payer: Self-pay

## 2020-10-25 ENCOUNTER — Encounter (INDEPENDENT_AMBULATORY_CARE_PROVIDER_SITE_OTHER): Payer: Self-pay | Admitting: Nurse Practitioner

## 2020-10-25 NOTE — Progress Notes (Signed)
Subjective:    Patient ID: Vickie Kemp, female    DOB: 04-23-1941, 80 y.o.   MRN: [id number] Chief Complaint  Patient presents with  . Follow-up    Rt unna boot check    The patient presents today for evaluation of her right lower extremity following blisters that resulted in ulcerations.  The patient was placed in [place] wraps for several weeks to heal these ulcerations.  Today these blisters are not visualized with some evidence of previous ulceration.  Skin appears to be healed over at this time.  The patient notes that her legs feel much better and there is no weeping from her legs either.  She denies any fever or chills.  Prior to this the patient was wearing medical grade 1 compression stockings and she had just received her lymphedema pump prior to being able to use her pump she began to have these blisters which prohibited her from doing so.  The patient also had gout during that time and that is also doing better as well.   Review of Systems  Cardiovascular: Positive for leg swelling.  Skin: Negative for wound.  All other systems reviewed and are negative.      Objective:   Physical Exam Vitals reviewed.  HENT:     Head: Normocephalic.  Cardiovascular:     Rate and Rhythm: Normal rate.     Pulses: Normal pulses.  Pulmonary:     Effort: Pulmonary effort is normal.  Musculoskeletal:     Right lower leg: Edema present.     Left lower leg: Edema present.  Neurological:     Mental Status: She is alert and oriented to person, place, and time.  Psychiatric:        Mood and Affect: Mood normal.        Behavior: Behavior normal.        Thought Content: Thought content normal.        Judgment: Judgment normal.     BP 116/63   Pulse 63   Ht 5\' 4"  (1.626 m)   Wt 197 lb (89.4 kg)   LMP  (LMP Unknown)   BMI 33.81 kg/m   Past Medical History:  Diagnosis Date  . Chronic kidney disease   . Coronary artery disease    MI, pci to mid LAD 2014. LHC 80%OM, 60%RCA, 90% LAD   . Diabetes mellitus without complication ([place])   . Hypertension   . Sleep apnea     Social History   Socioeconomic History  . Marital status: Single    Spouse name: Not on file  . Number of children: Not on file  . Years of education: Not on file  . Highest education level: Not on file  Occupational History  . Not on file  Tobacco Use  . Smoking status: Never Smoker  . Smokeless tobacco: Never Used  Vaping Use  . Vaping Use: Never used  Substance and Sexual Activity  . Alcohol use: Never  . Drug use: Never  . Sexual activity: Not on file  Other Topics Concern  . Not on file  Social History Narrative   Used to live in [place] Determinants of Health   Financial Resource Strain: Low Risk   . Difficulty of Paying Living Expenses: Not very hard  Food Insecurity: No Food Insecurity  . Worried About Charity fundraiser in the Last Year: Never true  . Ran Out of Food in  the Last Year: Never true  Transportation Needs: Not on file  Physical Activity: Not on file  Stress: Not on file  Social Connections: Not on file  Intimate Partner Violence: Not on file    Past Surgical History:  Procedure Laterality Date  . ACNE CYST REMOVAL  2008   Groin area  . CARDIAC CATHETERIZATION    . COLONOSCOPY WITH PROPOFOL N/A 05/27/2020   Procedure: COLONOSCOPY WITH PROPOFOL;  Surgeon: [name], MD;  Location: [facility] ENDOSCOPY;  Service: Gastroenterology;  Laterality: N/A;  . CORONARY ANGIOPLASTY      Family History  Problem Relation Age of Onset  . Diabetes Mother   . Heart disease Father     Allergies  Allergen Reactions  . Amoxicillin Itching    CBC Latest Ref Rng & Units 06/16/2020 04/24/2020 01/15/2020  WBC 4.0 - 10.5 K/uL 8.3 12.3(H) 8.0  Hemoglobin 12.0 - 15.0 g/dL 12.2 12.0 10.9(L)  Hematocrit 36.0 - 46.0 % 38.0 36.9 32.8(L)  Platelets 150.0 - 400.0 K/uL 202.0 200 305.0      CMP     Component Value Date/Time   NA 142 06/02/2020  1103   K 4.4 06/02/2020 1103   CL 104 06/02/2020 1103   CO2 29 06/02/2020 1103   GLUCOSE 130 (H) 06/02/2020 1103   BUN 26 (H) 06/02/2020 1103   CREATININE 1.60 (H) 06/02/2020 1103   CALCIUM 9.2 06/02/2020 1103   PROT 7.5 06/16/2020 0928   ALBUMIN 4.2 06/16/2020 0928   AST 14 06/16/2020 0928   ALT 9 06/16/2020 0928   ALKPHOS 117 06/16/2020 0928   BILITOT 0.4 06/16/2020 0928   GFRNONAA 31 (L) 06/02/2020 1103   GFRAA 37 (L) 04/24/2020 1119     No results found.     Assessment & Plan:   1. Venous ulcer ([place]) This is resolved.  Patient will continue with conservative therapy as outlined below.  2. Lymphedema The patient will transition to medical grade 1 compression socks on a daily basis.  The patient is advised to elevate her lower extremities when possible if she is not being active.  The patient should also utilize her lymphedema pump for an hour during the evening.  If she is able, ambulation is also helpful for controlling edema.  Patient return in 3 months for evaluation of her lower extremity edema she will contact us sooner if her legs begin to ulcerate or if swelling is not under good control.  3. Essential (primary) hypertension Continue antihypertensive medications as already ordered, these medications have been reviewed and there are no changes at this time.    Current Outpatient Medications on File Prior to Visit  Medication Sig Dispense Refill  . acetaminophen (TYLENOL) 650 MG CR tablet Take 650 mg by mouth 3 (three) times daily as needed.    [name] albuterol (VENTOLIN HFA) 108 (90 Base) MCG/ACT inhaler Inhale 1-2 puffs into the lungs every 6 (six) hours as needed for wheezing or shortness of breath. 18 g 2  . amLODipine (NORVASC) 5 MG tablet Take 1 tablet (5 mg total) by mouth daily. 90 tablet 0  . aspirin EC 81 MG tablet Take 81 mg by mouth daily.    [name] atorvastatin (LIPITOR) 40 MG tablet Take 1 tablet (40 mg total) by mouth daily. 90 tablet 0  . carvedilol (COREG) 25  MG tablet Take 1 tablet (25 mg total) by mouth 2 (two) times daily. 180 tablet 3  . dicyclomine (BENTYL) 10 MG capsule Take 1 capsule (10 mg  total) by mouth 4 (four) times daily -  before meals and at bedtime. 15 capsule 0  . furosemide (LASIX) 40 MG tablet Take 1 tablet (40 mg total) by mouth in the morning and at bedtime. 60 tablet 3  . Glucose Blood (BLOOD GLUCOSE TEST STRIPS) STRP E11.9 check sugar tid for designated machine 300 strip 5  . insulin degludec (TRESIBA FLEXTOUCH) 200 UNIT/ML FlexTouch Pen Inject 40 Units into the skin daily. 30 mL 3  . insulin lispro (HUMALOG KWIKPEN) 100 UNIT/ML KwikPen Prn based on sugar If sugar 70-130 0 units If sugar 131-180 4 units If sugar 181-240 8 units If sugar 241-300 10 units If sugar 301-350 12 units If sugar 351-400 16 units If >400 20 units and call doctor 15 mL 11  . Insulin Pen Needle (PEN NEEDLES) 31G X 8 MM MISC 1 pen by Does not apply route 2 (two) times daily as needed. 100 each 3  . Insulin Pen Needle 32G X 4 MM MISC 1 Device by Does not apply route 4 (four) times daily - after meals and at bedtime. 360 each 3  . latanoprost (XALATAN) 0.005 % ophthalmic solution Place 1 drop into the left eye at bedtime.    [name] losartan (COZAAR) 100 MG tablet Take 100 mg by mouth daily.    . Turmeric (QC TUMERIC COMPLEX PO) Take by mouth.      No current facility-administered medications on file prior to visit.    There are no Patient Instructions on file for this visit. No follow-ups on file.   Kris Hartmann, NP

## 2020-11-07 DIAGNOSIS — E1165 Type 2 diabetes mellitus with hyperglycemia: Secondary | ICD-10-CM | POA: Diagnosis not present

## 2020-11-09 DIAGNOSIS — G4733 Obstructive sleep apnea (adult) (pediatric): Secondary | ICD-10-CM | POA: Diagnosis not present

## 2020-11-15 ENCOUNTER — Encounter: Payer: Self-pay | Admitting: Internal Medicine

## 2020-11-16 DIAGNOSIS — G4733 Obstructive sleep apnea (adult) (pediatric): Secondary | ICD-10-CM | POA: Diagnosis not present

## 2020-11-18 ENCOUNTER — Other Ambulatory Visit: Payer: Self-pay

## 2020-11-18 ENCOUNTER — Ambulatory Visit
Admission: RE | Admit: 2020-11-18 | Discharge: 2020-11-18 | Disposition: A | Discharge status: Home / Self Care | Payer: Medicare Other | Source: Ambulatory Visit | Attending: Internal Medicine | Admitting: Internal Medicine

## 2020-11-18 DIAGNOSIS — Z1231 Encounter for screening mammogram for malignant neoplasm of breast: Secondary | ICD-10-CM | POA: Diagnosis not present

## 2020-11-18 IMAGING — MG MM DIGITAL SCREENING BILAT W/ TOMO AND CAD
6 of 12 series · 6 of 36 positions shown · non-contrast
Comparison: Previous exam(s).

CLINICAL DATA: Screening.

EXAM:
DIGITAL SCREENING BILATERAL MAMMOGRAM WITH TOMOSYNTHESIS AND CAD
TECHNIQUE: Bilateral screening digital craniocaudal and mediolateral oblique
mammograms were obtained. Bilateral screening digital breast
tomosynthesis was performed. The images were evaluated with
computer-aided detection.

[L CC synth-2D]
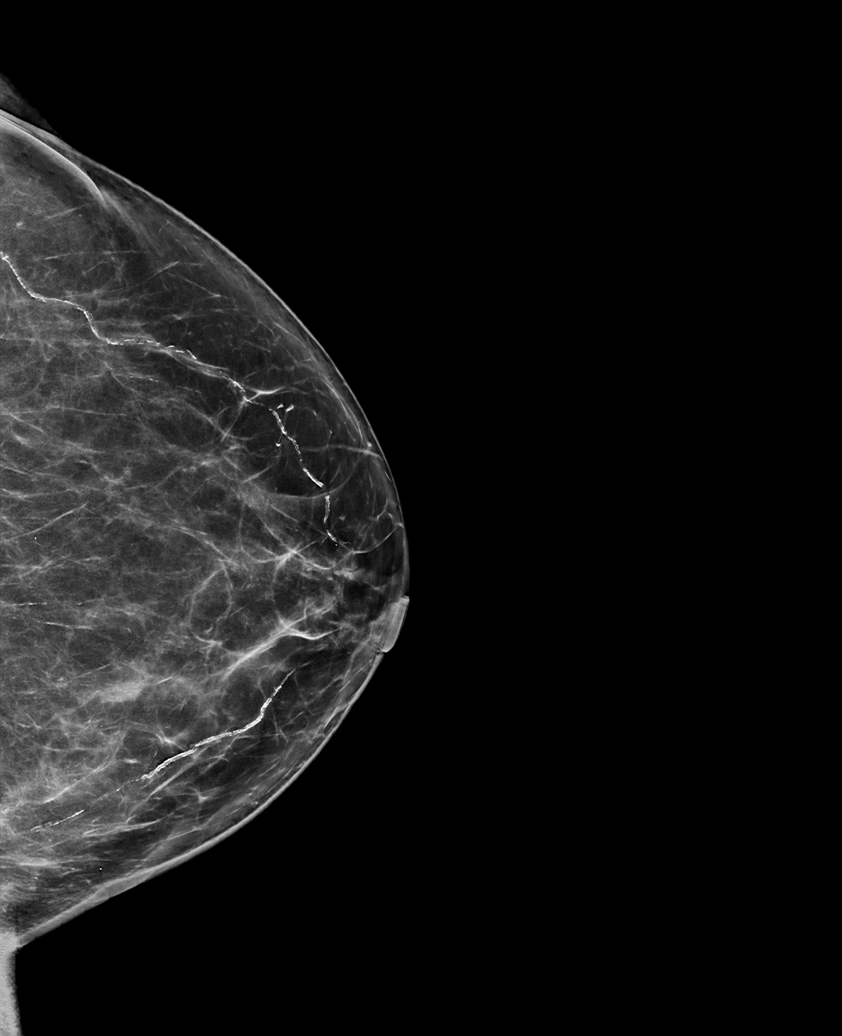

[R MLO synth-2D (1 of 2)]
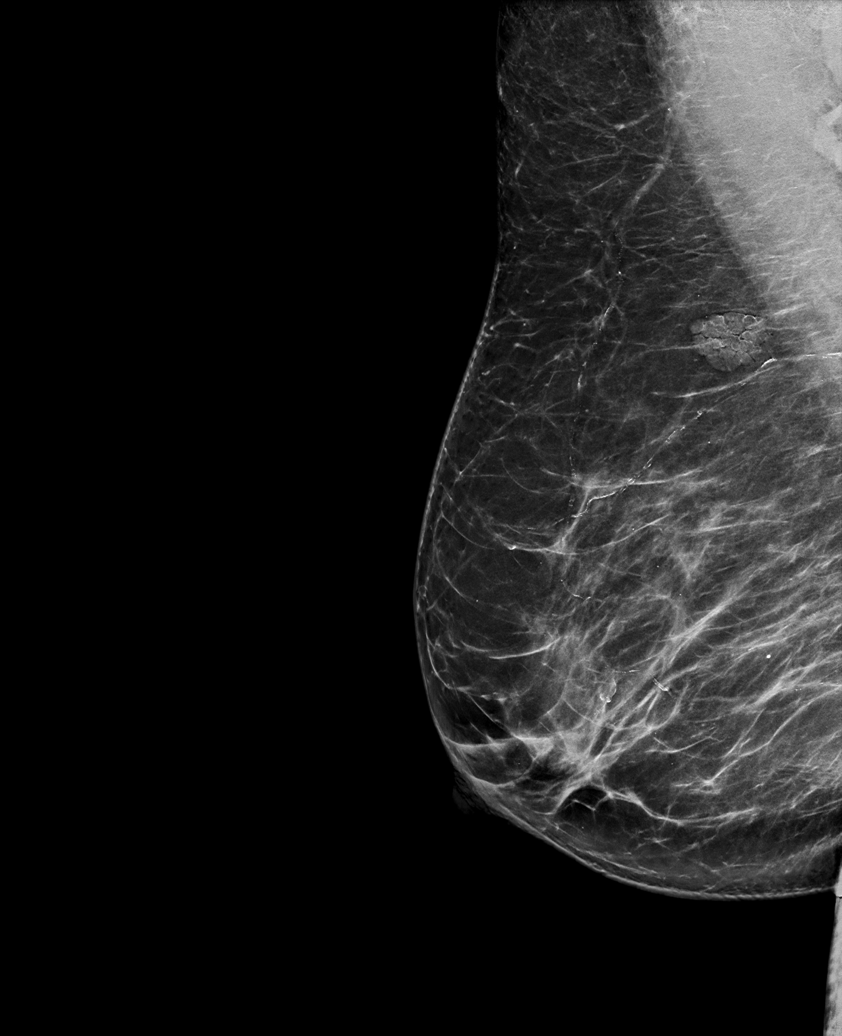

[R CC synth-2D]
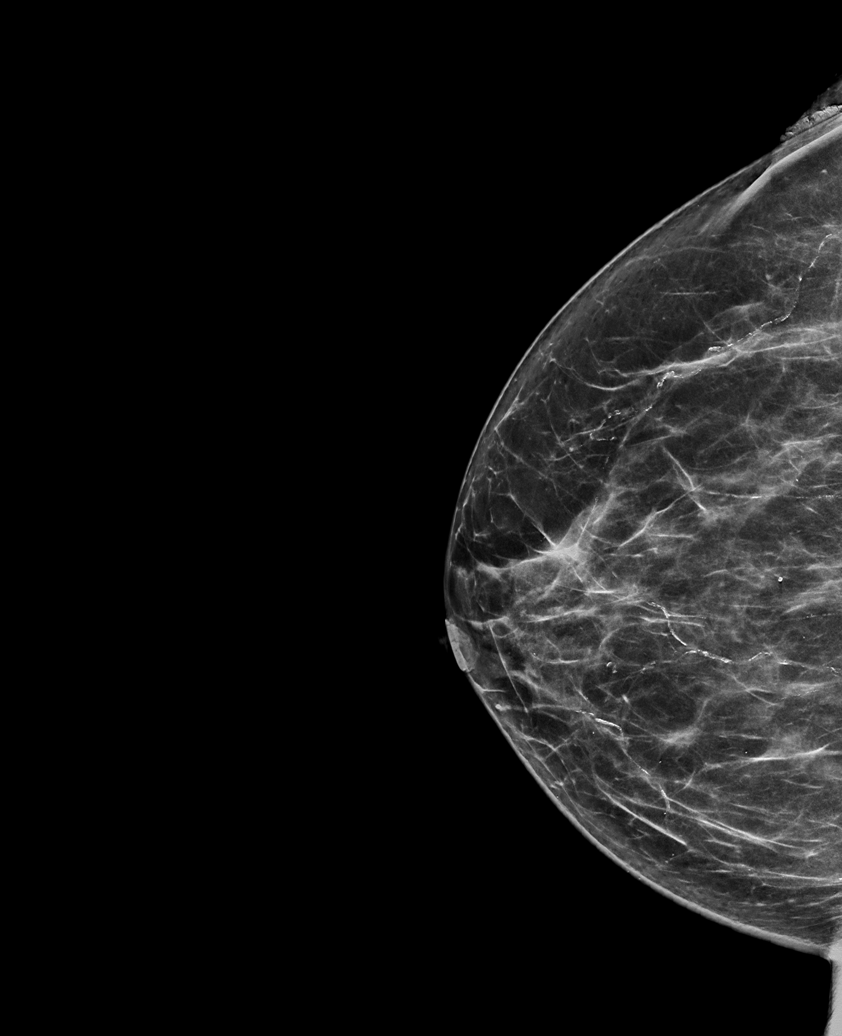

[R MLO synth-2D (2 of 2)]
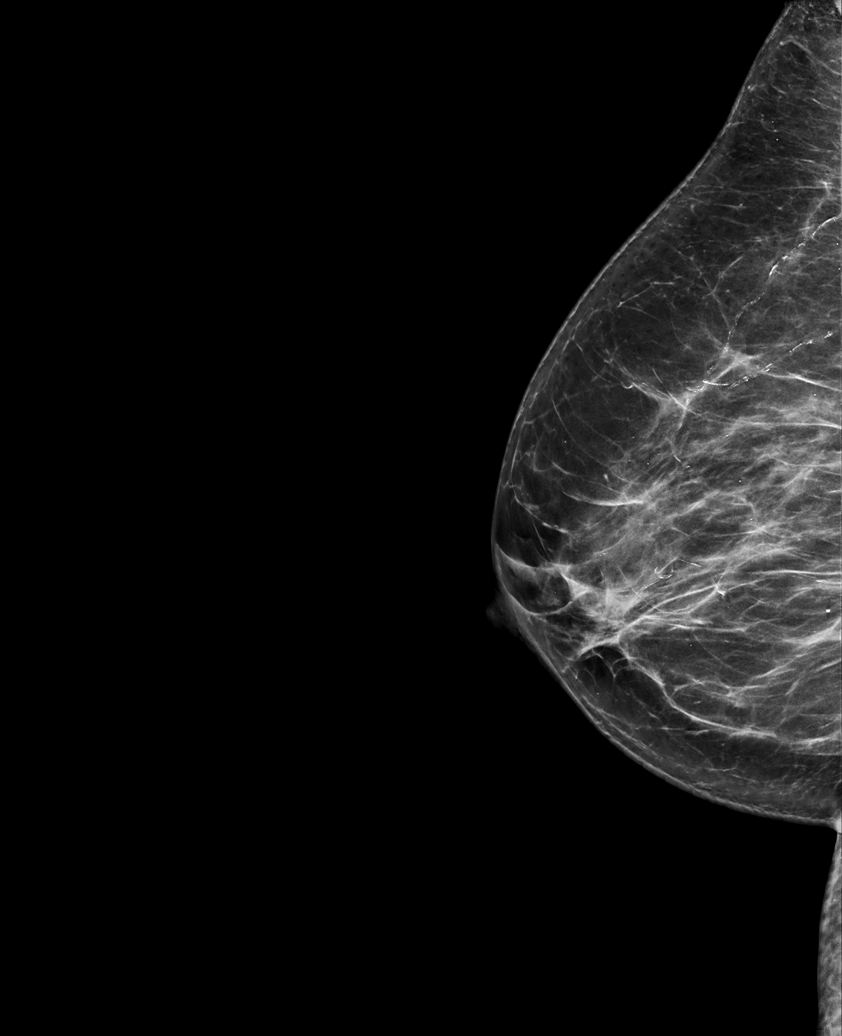

[L MLO synth-2D (1 of 2)]
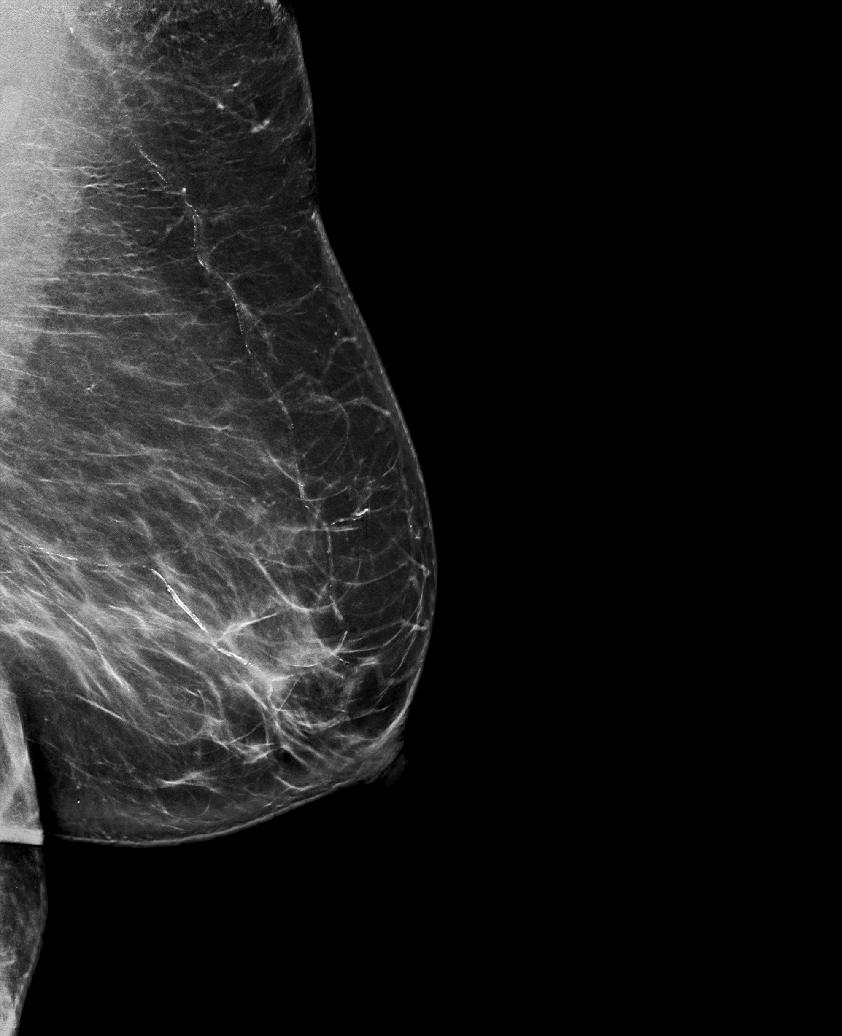

[L MLO synth-2D (2 of 2)]
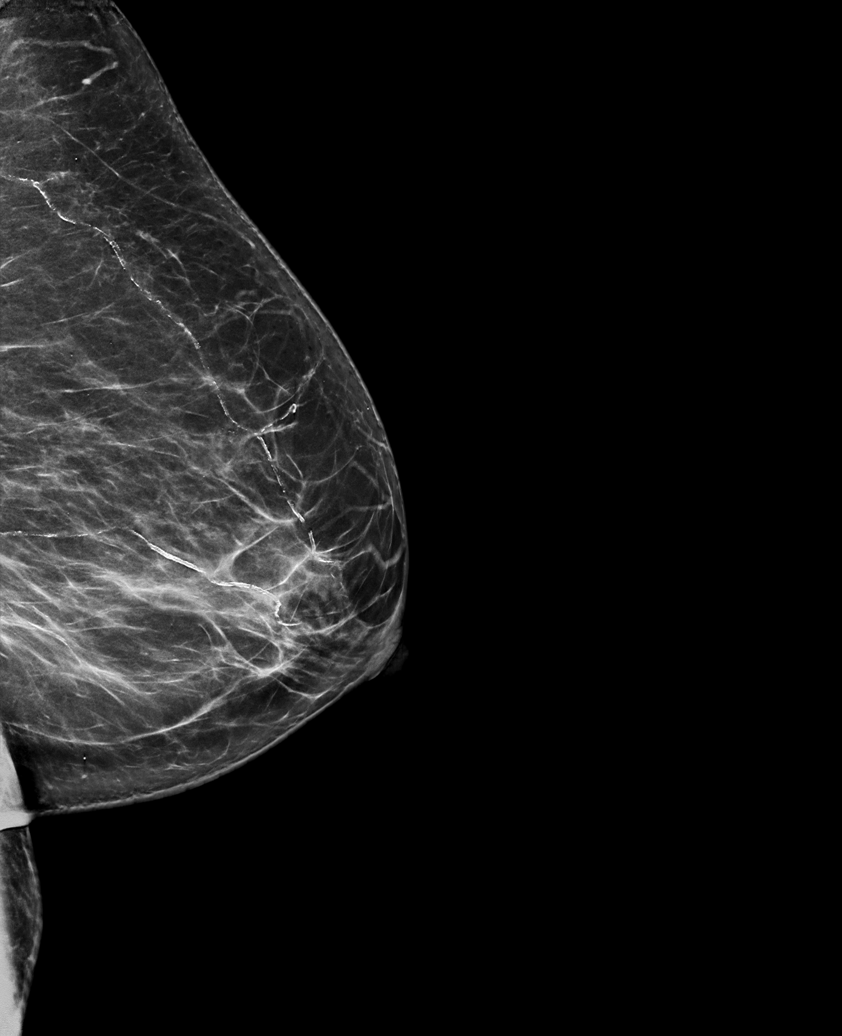

[6 of 36 positions shown; findings below may reference images not displayed]

ACR Breast Density Category b: There are scattered areas of
fibroglandular density.
FINDINGS: There are no findings suspicious for malignancy. The images were
evaluated with computer-aided detection.
IMPRESSION: No mammographic evidence of malignancy. A result letter of this
screening mammogram will be mailed directly to the patient.

RECOMMENDATION:
Screening mammogram in one year. (Code:[OD])

BI-RADS CATEGORY  1: Negative.

## 2020-11-24 ENCOUNTER — Encounter (INDEPENDENT_AMBULATORY_CARE_PROVIDER_SITE_OTHER): Payer: Self-pay

## 2020-11-25 ENCOUNTER — Ambulatory Visit (INDEPENDENT_AMBULATORY_CARE_PROVIDER_SITE_OTHER): Payer: Medicare Other

## 2020-11-25 VITALS — Ht 64.0 in | Wt 197.0 lb

## 2020-11-25 DIAGNOSIS — Z0000 Encounter for general adult medical examination without abnormal findings: Secondary | ICD-10-CM

## 2020-11-25 NOTE — Progress Notes (Signed)
Subjective:   Vickie Kemp is a 80 y.o. female who presents for Medicare Annual (Subsequent) preventive examination.  Review of Systems    No ROS.  Medicare Wellness Virtual Visit.   Cardiac Risk Factors include: advanced age (>79men, >50 women);diabetes mellitus;hypertension     Objective:    Today's Vitals   11/25/20 1403  Weight: 197 lb (89.4 kg)  Height: 5\' 4"  (1.626 m)   Body mass index is 33.81 kg/m.  Advanced Directives 11/25/2020 05/27/2020 04/24/2020 11/25/2019  Does Patient Have a Medical Advance Directive? Yes Yes Yes Yes  Type of Paramedic of [place];Living will Healthcare Power of [place];Living will;Out of facility DNR (pink MOST or yellow form)  Does patient want to make changes to medical advance directive? No - Patient declined - - No - Patient declined  Copy of [place] in Chart? No - copy requested No - copy requested - No - copy requested    Current Medications (verified) Outpatient Encounter Medications as of 11/25/2020  Medication Sig  . acetaminophen (TYLENOL) 650 MG CR tablet Take 650 mg by mouth 3 (three) times daily as needed.  [name] albuterol (VENTOLIN HFA) 108 (90 Base) MCG/ACT inhaler Inhale 1-2 puffs into the lungs every 6 (six) hours as needed for wheezing or shortness of breath.  [name] amLODipine (NORVASC) 5 MG tablet Take 1 tablet (5 mg total) by mouth daily.  [name] aspirin EC 81 MG tablet Take 81 mg by mouth daily.  [name] atorvastatin (LIPITOR) 40 MG tablet Take 1 tablet (40 mg total) by mouth daily.  . carvedilol (COREG) 25 MG tablet Take 1 tablet (25 mg total) by mouth 2 (two) times daily.  [name] dicyclomine (BENTYL) 10 MG capsule Take 1 capsule (10 mg total) by mouth 4 (four) times daily -  before meals and at bedtime.  . furosemide (LASIX) 40 MG tablet Take 1 tablet (40 mg total) by mouth in the morning and at bedtime.  . Glucose Blood (BLOOD GLUCOSE TEST  STRIPS) STRP E11.9 check sugar tid for designated machine  . insulin degludec (TRESIBA FLEXTOUCH) 200 UNIT/ML FlexTouch Pen Inject 40 Units into the skin daily.  . insulin lispro (HUMALOG KWIKPEN) 100 UNIT/ML KwikPen Prn based on sugar If sugar 70-130 0 units If sugar 131-180 4 units If sugar 181-240 8 units If sugar 241-300 10 units If sugar 301-350 12 units If sugar 351-400 16 units If >400 20 units and call doctor  . Insulin Pen Needle (PEN NEEDLES) 31G X 8 MM MISC 1 pen by Does not apply route 2 (two) times daily as needed.  . Insulin Pen Needle 32G X 4 MM MISC 1 Device by Does not apply route 4 (four) times daily - after meals and at bedtime.  [name] latanoprost (XALATAN) 0.005 % ophthalmic solution Place 1 drop into the left eye at bedtime.  [name] losartan (COZAAR) 100 MG tablet Take 100 mg by mouth daily.  . Turmeric (QC TUMERIC COMPLEX PO) Take by mouth.    No facility-administered encounter medications on file as of 11/25/2020.    Allergies (verified) Amoxicillin   History: Past Medical History:  Diagnosis Date  . Chronic kidney disease   . Coronary artery disease    MI, pci to mid LAD 2014. LHC 80%OM, 60%RCA, 90% LAD  . Diabetes mellitus without complication ([place])   . Hypertension   . Sleep apnea    Past Surgical History:  Procedure Laterality Date  .  ACNE CYST REMOVAL  2008   Groin area  . CARDIAC CATHETERIZATION    . COLONOSCOPY WITH PROPOFOL N/A 05/27/2020   Procedure: COLONOSCOPY WITH PROPOFOL;  Surgeon: [name], MD;  [facility]: [facility] END[facility]COPY;  Service: Gastroenterology;  Laterality: N/A;  . CORONARY ANGIOPLASTY     Family History  Problem Relation Age of Onset  . Diabetes Mother   . Heart disease Father   . Breast cancer Neg Hx    Social History   Socioeconomic History  . Marital status: Single    Spouse name: Not on file  . Number of children: Not on file  . Years of education: Not on file  . Highest education level: Not on file  Occupational History   . Not on file  Tobacco Use  . Smoking status: Never Smoker  . Smokeless tobacco: Never Used  Vaping Use  . Vaping Use: Never used  Substance and Sexual Activity  . Alcohol use: Never  . Drug use: Never  . Sexual activity: Not on file  Other Topics Concern  . Not on file  Social History Narrative   Used to live in [place] Determinants of Health   Financial Resource Strain: Low Risk   . Difficulty of Paying Living Expenses: Not hard at all  Food Insecurity: No Food Insecurity  . Worried About Charity fundraiser in the Last Year: Never true  . Ran Out of Food in the Last Year: Never true  Transportation Needs: No Transportation Needs  . Lack of Transportation (Medical): No  . Lack of Transportation (Non-Medical): No  Physical Activity: Not on file  Stress: No Stress Concern Present  . Feeling of Stress : Not at all  Social Connections: Unknown  . Frequency of Communication with Friends and Family: More than three times a week  . Frequency of Social Gatherings with Friends and Family: More than three times a week  . Attends Religious Services: More than 4 times per year  . Active Member of Clubs or Organizations: Yes  . Attends Archivist Meetings: Not on file  . Marital Status: Not on file    Tobacco Counseling Counseling given: Not Answered   Clinical Intake:  Pre-visit preparation completed: Yes        Diabetes: Yes (Followed by pcp)  How often do you need to have someone help you when you read instructions, pamphlets, or other written materials from your doctor or pharmacy?: 1 - Never  Nutrition Risk Assessment: Has the patient had any N/V/D within the last 2 months?  No  Does the patient have any non-healing wounds?  No  Has the patient had any unintentional weight loss or weight gain?  No   Diabetes: If diabetic, was a CBG obtained today?  Yes , 116 Did the patient bring in their glucometer from home?  No  How often  do you monitor your CBG's? 1 hour after each meal and several times daily.   Financial Strains and Diabetes Management: Is the patient seen by Chronic Care Management for management of their diabetes?  Yes    Interpreter Needed?: No      Activities of Daily Living In your present state of health, do you have any difficulty performing the following activities: 11/25/2020  Hearing? N  Vision? N  Difficulty concentrating or making decisions? (No Data)  Comment Memory Loss Dx 01/20/20  Walking or climbing stairs? Y  Comment Unsteady gait.  Cane/walker in use.  Dressing or bathing? N  Doing errands, shopping? Y  Comment Patient does not drive, without  license. Does not run erands alone.  Preparing Food and eating ? N  Using the Toilet? N  In the past six months, have you accidently leaked urine? N  Do you have problems with loss of bowel control? N  Comment Followed by GI  Managing your Medications? N  Managing your Finances? N  Housekeeping or managing your Housekeeping? N  Some recent data might be hidden    Patient Care Team: McLean-Scocuzza, Nino Glow, MD as PCP - General (Internal Medicine) Kate Sable, MD as PCP - Cardiology (Cardiology) De Hollingshead, RPH-CPP (Pharmacist)  Indicate any recent Medical Services you may have received from other than [facility] providers in the past year (date may be approximate).     Assessment:   This is a routine wellness examination for Emanuelle.  I connected with Mayela today by telephone and verified that I am speaking with the correct person using two identifiers. Location patient: home Location provider: work Persons participating in the virtual visit: patient, Marine scientist.    I discussed the limitations, risks, security and privacy concerns of performing an evaluation and management service by telephone and the availability of in person appointments. The patient expressed understanding and verbally consented to this telephonic  visit.    Interactive audio and video telecommunications were attempted between this provider and patient, however failed, due to patient having technical difficulties OR patient did not have access to video capability.  We continued and completed visit with audio only.  Some vital signs may be absent or patient reported.   Hearing/Vision screen  Hearing Screening   125Hz  250Hz  500Hz  1000Hz  2000Hz  3000Hz  4000Hz  6000Hz  8000Hz   Right ear:           Left ear:           Comments: Patient is able to hear conversational tones without difficulty.  No issues reported.  Vision Screening Comments: Followed by [facility] Wears corrective lenses Visual acuity not assessed, virtual visit.  They have seen their ophthalmologist in the last 12 months.     Dietary issues and exercise activities discussed: Current Exercise Habits: Home exercise routine, Type of exercise: walking (Chair/standing and squat exercises), Intensity: Mild  Regular diet; she tries to have a healthy diet. Portion control. Good water intake  Goals      Patient Stated   .  I would like to take online classes (pt-stated)      Brain enhancement activity    .  Medication Monitoring (pt-stated)      Patient Goals/Self-Care Activities . Over the next 90 days, patient will:  - take medications as prescribed check glucose at least three times daily using CGM, document, and provide at future appointments      Depression Screen [facility] 2/9 Scores 11/25/2020 02/12/2020 11/25/2019 09/30/2019 04/28/2019  PHQ - 2 Score 0 0 0 0 0  PHQ- 9 Score - - - - 0    Fall Risk Fall Risk  11/25/2020 05/14/2020 02/12/2020 12/26/2019 11/25/2019  Falls in the past year? 0 0 0 1 1  Number falls in past yr: 0 0 0 1 (No Data)  Comment - - - - Notes fell lastnight while sitting on the egde of the bed putting on socks. Denies injury. Cannot recall slipping/falling off the bed, just getting back up. Declines follow up at this time.  Injury with Fall? 0 0 0 0  0   Risk for fall due to : Impaired balance/gait - - History of fall(s) -  Follow up Falls evaluation completed Falls evaluation completed Falls evaluation completed Falls evaluation completed Falls evaluation completed;Falls prevention discussed    FALL RISK PREVENTION PERTAINING TO THE HOME: Handrails in use when climbing stairs? Yes Home free of loose throw rugs in walkways, pet beds, electrical cords, etc? Yes  Adequate lighting in your home to reduce risk of falls? Yes   ASSISTIVE DEVICES UTILIZED TO PREVENT FALLS: Life alert? No  Use of a cane, walker or w/c? Yes , cane/walker as needed. Grab bars in the bathroom? No  Shower chair or bench in shower? Yes  Elevated toilet seat? Yes   TIMED UP AND GO: Was the test performed? No . Virtual visit.  Cognitive Function:     6CIT Screen 11/25/2020 11/25/2019  What Year? 0 points 0 points  What month? 0 points 0 points  What time? 0 points 0 points  Count back from 20 0 points 0 points  Months in reverse 0 points 0 points  Repeat phrase - 0 points  Total Score - 0    Immunizations Immunization History  Administered Date(s) Administered  . Fluad Quad(high Dose 65+) 04/28/2019, 05/14/2020  . Influenza, High Dose Seasonal PF 06/04/2017, 06/07/2018  . PFIZER(Purple Top)SARS-COV-2 Vaccination 10/08/2019, 10/29/2019, 05/14/2020  . Pneumococcal Polysaccharide-23 05/05/2013    TDAP status: Due, Education has been provided regarding the importance of this vaccine. Advised may receive this vaccine at local pharmacy or Health Dept. Aware to provide a copy of the vaccination record if obtained from local pharmacy or Health Dept. Verbalized acceptance and understanding. Deferred.  PNA vaccine- Advised may receive this vaccine ibn office, at local pharmacy or Health Dept. Aware to provide a copy of the vaccination record if obtained from local pharmacy or Health Dept. Verbalized acceptance and understanding. Deferred.  Health  Maintenance Health Maintenance  Topic Date Due  . DEXA SCAN  11/25/2021 (Originally 11/30/2006)  . TETANUS/TDAP  11/25/2021 (Originally 11/29/1959)  . PNA vac Low Risk Adult (2 of 2 - PCV13) 11/25/2021 (Originally 05/05/2014)  . HEMOGLOBIN A1C  12/14/2020  . INFLUENZA VACCINE  03/14/2021  . OPHTHALMOLOGY EXAM  04/20/2021  . FOOT EXAM  10/14/2021  . COLONOSCOPY (Pts 45-12yrs Insurance coverage will need to be confirmed)  05/27/2025  . COVID-19 Vaccine  Completed  . Hepatitis C Screening  Completed  . HPV VACCINES  Aged Out      There are no preventive care reminders to display for this patient. Bone density- deferred per patient preference.   Colorectal cancer screening: Type of screening: Colonoscopy. Completed 05/27/20. Repeat every 5 years  Mammogram status: Completed 11/18/20. Repeat every year  Lung Cancer Screening: (Low Dose CT Chest recommended if Age 37-80 years, 30 pack-year currently smoking OR have quit w/in 15years.) does not qualify.   Hepatitis C Screening: does not qualify.  Vision Screening: Recommended annual ophthalmology exams for early detection of glaucoma and other disorders of the eye.  Is the patient up to date with their annual eye exam?  Yes  Who is the provider or what is the name of the office in which the patient attends annual eye exams? [facility].   Dental Screening: Recommended annual dental exams for proper oral hygiene.  Community Resource Referral / Chronic Care Management: CRR required this visit?  No   CCM required this visit?  No      Plan:   Keep all routine  maintenance appointments.   CCM 12/15/20 @ 1:00  Next scheduled lab 02/15/21 @ 10:00  Follow up 02/17/21 @ 10:30  I have personally reviewed and noted the following in the patient's chart:   . Medical and social history . Use of alcohol, tobacco or illicit drugs  . Current medications and supplements . Functional ability and status . Nutritional status . Physical  activity . Advanced directives . List of other physicians . Hospitalizations, surgeries, and ER visits in previous 12 months . Vitals . Screenings to include cognitive, depression, and falls . Referrals and appointments  In addition, I have reviewed and discussed with patient certain preventive protocols, quality metrics, and best practice recommendations. A written personalized care plan for preventive services as well as general preventive health recommendations were provided to patient via mychart.     Varney Biles, LPN   [id number]

## 2020-11-25 NOTE — Patient Instructions (Addendum)
Vickie Kemp , Thank you for taking time to come for your Medicare Wellness Visit. I appreciate your ongoing commitment to your health goals. Please review the following plan we discussed and let me know if I can assist you in the future.   These are the goals we discussed: Goals      Patient Stated   .  I would like to take online classes (pt-stated)      Brain enhancement activity    .  Medication Monitoring (pt-stated)      Patient Goals/Self-Care Activities . Over the next 90 days, patient will:  - take medications as prescribed check glucose at least three times daily using CGM, document, and provide at future appointments       This is a list of the screening recommended for you and due dates:  Health Maintenance  Topic Date Due  . DEXA scan (bone density measurement)  11/25/2021*  . Tetanus Vaccine  11/25/2021*  . Pneumonia vaccines (2 of 2 - PCV13) 11/25/2021*  . Hemoglobin A1C  12/14/2020  . Flu Shot  03/14/2021  . Eye exam for diabetics  04/20/2021  . Complete foot exam   10/14/2021  . Colon Cancer Screening  05/27/2025  . COVID-19 Vaccine  Completed  .  Hepatitis C: One time screening is recommended by Center for Disease Control  (CDC) for  adults born from [birth year] through 1964.   Completed  . HPV Vaccine  Aged Out  *Topic was postponed. The date shown is not the original due date.   Keep all routine maintenance appointments.   CCM 12/15/20 @ 1:00  Next scheduled lab 02/15/21 @ 10:00  Follow up 02/17/21 @ 10:30  Advanced directives: End of life planning; Advance aging; Advanced directives discussed.  Copy of current HCPOA/Living Will requested.    Conditions/risks identified: none new.  Follow up in one year for your annual wellness visit.   Preventive Care 9 Years and Older, Female Preventive care refers to lifestyle choices and visits with your health care provider that can promote health and wellness. What does preventive care include?  A yearly physical  exam. This is also called an annual well check.  Dental exams once or twice a year.  Routine eye exams. Ask your health care provider how often you should have your eyes checked.  Personal lifestyle choices, including:  Daily care of your teeth and gums.  Regular physical activity.  Eating a healthy diet.  Avoiding tobacco and drug use.  Limiting alcohol use.  Practicing safe sex.  Taking low-dose aspirin every day.  Taking vitamin and mineral supplements as recommended by your health care provider. What happens during an annual well check? The services and screenings done by your health care provider during your annual well check will depend on your age, overall health, lifestyle risk factors, and family history of disease. Counseling  Your health care provider may ask you questions about your:  Alcohol use.  Tobacco use.  Drug use.  Emotional well-being.  Home and relationship well-being.  Sexual activity.  Eating habits.  History of falls.  Memory and ability to understand (cognition).  Work and work Statistician.  Reproductive health. Screening  You may have the following tests or measurements:  Height, weight, and BMI.  Blood pressure.  Lipid and cholesterol levels. These may be checked every 5 years, or more frequently if you are over 71 years old.  Skin check.  Lung cancer screening. You may have this screening every year starting  at age 68 if you have a 30-pack-year history of smoking and currently smoke or have quit within the past 15 years.  Fecal occult blood test (FOBT) of the stool. You may have this test every year starting at age 41.  Flexible sigmoidoscopy or colonoscopy. You may have a sigmoidoscopy every 5 years or a colonoscopy every 10 years starting at age 38.  Hepatitis C blood test.  Hepatitis B blood test.  Sexually transmitted disease (STD) testing.  Diabetes screening. This is done by checking your blood sugar (glucose)  after you have not eaten for a while (fasting). You may have this done every 1-3 years.  Bone density scan. This is done to screen for osteoporosis. You may have this done starting at age 44.  Mammogram. This may be done every 1-2 years. Talk to your health care provider about how often you should have regular mammograms. Talk with your health care provider about your test results, treatment options, and if necessary, the need for more tests. Vaccines  Your health care provider may recommend certain vaccines, such as:  Influenza vaccine. This is recommended every year.  Tetanus, diphtheria, and acellular pertussis (Tdap, Td) vaccine. You may need a Td booster every 10 years.  Zoster vaccine. You may need this after age 61.  Pneumococcal 13-valent conjugate (PCV13) vaccine. One dose is recommended after age 34.  Pneumococcal polysaccharide (PPSV23) vaccine. One dose is recommended after age 34. Talk to your health care provider about which screenings and vaccines you need and how often you need them. This information is not intended to replace advice given to you by your health care provider. Make sure you discuss any questions you have with your health care provider. Document Released: 08/27/2015 Document Revised: 04/19/2016 Document Reviewed: 06/01/2015 Elsevier Interactive Patient Education  2017 [place] Prevention in the Home Falls can cause injuries. They can happen to people of all ages. There are many things you can do to make your home safe and to help prevent falls. What can I do on the outside of my home?  Regularly fix the edges of walkways and driveways and fix any cracks.  Remove anything that might make you trip as you walk through a door, such as a raised step or threshold.  Trim any bushes or trees on the path to your home.  Use bright outdoor lighting.  Clear any walking paths of anything that might make someone trip, such as rocks or  tools.  Regularly check to see if handrails are loose or broken. Make sure that both sides of any steps have handrails.  Any raised decks and porches should have guardrails on the edges.  Have any leaves, snow, or ice cleared regularly.  Use sand or salt on walking paths during winter.  Clean up any spills in your garage right away. This includes oil or grease spills. What can I do in the bathroom?  Use night lights.  Install grab bars by the toilet and in the tub and shower. Do not use towel bars as grab bars.  Use non-skid mats or decals in the tub or shower.  If you need to sit down in the shower, use a plastic, non-slip stool.  Keep the floor dry. Clean up any water that spills on the floor as soon as it happens.  Remove soap buildup in the tub or shower regularly.  Attach bath mats securely with double-sided non-slip rug tape.  Do not have throw rugs and other things  on the floor that can make you trip. What can I do in the bedroom?  Use night lights.  Make sure that you have a light by your bed that is easy to reach.  Do not use any sheets or blankets that are too big for your bed. They should not hang down onto the floor.  Have a firm chair that has side arms. You can use this for support while you get dressed.  Do not have throw rugs and other things on the floor that can make you trip. What can I do in the kitchen?  Clean up any spills right away.  Avoid walking on wet floors.  Keep items that you use a lot in easy-to-reach places.  If you need to reach something above you, use a strong step stool that has a grab bar.  Keep electrical cords out of the way.  Do not use floor polish or wax that makes floors slippery. If you must use wax, use non-skid floor wax.  Do not have throw rugs and other things on the floor that can make you trip. What can I do with my stairs?  Do not leave any items on the stairs.  Make sure that there are handrails on both  sides of the stairs and use them. Fix handrails that are broken or loose. Make sure that handrails are as long as the stairways.  Check any carpeting to make sure that it is firmly attached to the stairs. Fix any carpet that is loose or worn.  Avoid having throw rugs at the top or bottom of the stairs. If you do have throw rugs, attach them to the floor with carpet tape.  Make sure that you have a light switch at the top of the stairs and the bottom of the stairs. If you do not have them, ask someone to add them for you. What else can I do to help prevent falls?  Wear shoes that:  Do not have high heels.  Have rubber bottoms.  Are comfortable and fit you well.  Are closed at the toe. Do not wear sandals.  If you use a stepladder:  Make sure that it is fully opened. Do not climb a closed stepladder.  Make sure that both sides of the stepladder are locked into place.  Ask someone to hold it for you, if possible.  Clearly mark and make sure that you can see:  Any grab bars or handrails.  First and last steps.  Where the edge of each step is.  Use tools that help you move around (mobility aids) if they are needed. These include:  Canes.  Walkers.  Scooters.  Crutches.  Turn on the lights when you go into a dark area. Replace any light bulbs as soon as they burn out.  Set up your furniture so you have a clear path. Avoid moving your furniture around.  If any of your floors are uneven, fix them.  If there are any pets around you, be aware of where they are.  Review your medicines with your doctor. Some medicines can make you feel dizzy. This can increase your chance of falling. Ask your doctor what other things that you can do to help prevent falls. This information is not intended to replace advice given to you by your health care provider. Make sure you discuss any questions you have with your health care provider. Document Released: 05/27/2009 Document Revised:  01/06/2016 Document Reviewed: 09/04/2014 Elsevier Interactive Patient Education  2017 [place].

## 2020-11-27 ENCOUNTER — Encounter: Payer: Self-pay | Admitting: Internal Medicine

## 2020-12-02 DIAGNOSIS — E1165 Type 2 diabetes mellitus with hyperglycemia: Secondary | ICD-10-CM | POA: Diagnosis not present

## 2020-12-10 DIAGNOSIS — G4733 Obstructive sleep apnea (adult) (pediatric): Secondary | ICD-10-CM | POA: Diagnosis not present

## 2020-12-14 NOTE — Telephone Encounter (Signed)
Ok. We can discuss at follow-up. Thanks for update

## 2020-12-14 NOTE — Telephone Encounter (Signed)
Sending to [facility] to make aware.

## 2020-12-15 ENCOUNTER — Ambulatory Visit: Payer: Medicare Other

## 2020-12-16 ENCOUNTER — Encounter (INDEPENDENT_AMBULATORY_CARE_PROVIDER_SITE_OTHER): Payer: Self-pay | Admitting: Nurse Practitioner

## 2020-12-16 ENCOUNTER — Other Ambulatory Visit: Payer: Self-pay

## 2020-12-16 ENCOUNTER — Ambulatory Visit (INDEPENDENT_AMBULATORY_CARE_PROVIDER_SITE_OTHER): Payer: Medicare Other | Admitting: Nurse Practitioner

## 2020-12-16 VITALS — BP 121/66 | HR 70 | Resp 16 | Wt 199.8 lb

## 2020-12-16 DIAGNOSIS — L97909 Non-pressure chronic ulcer of unspecified part of unspecified lower leg with unspecified severity: Secondary | ICD-10-CM

## 2020-12-16 DIAGNOSIS — I10 Essential (primary) hypertension: Secondary | ICD-10-CM

## 2020-12-16 DIAGNOSIS — I890 Lymphedema, not elsewhere classified: Secondary | ICD-10-CM | POA: Diagnosis not present

## 2020-12-16 DIAGNOSIS — I83009 Varicose veins of unspecified lower extremity with ulcer of unspecified site: Secondary | ICD-10-CM | POA: Diagnosis not present

## 2020-12-19 ENCOUNTER — Encounter (INDEPENDENT_AMBULATORY_CARE_PROVIDER_SITE_OTHER): Payer: Self-pay

## 2020-12-20 ENCOUNTER — Encounter: Payer: Self-pay | Admitting: Internal Medicine

## 2020-12-20 ENCOUNTER — Encounter (INDEPENDENT_AMBULATORY_CARE_PROVIDER_SITE_OTHER): Payer: Self-pay | Admitting: Nurse Practitioner

## 2020-12-20 NOTE — Progress Notes (Signed)
Subjective:    Patient ID: Vickie Kemp, female    DOB: 1941/05/15, 80 y.o.   MRN: [id number] Chief Complaint  Patient presents with  . Follow-up    3 month follow up    Vickie Kemp is a 80 year old female that returns today for evaluation of lower extremity edema.  Previously the patient had blisters develop on her right lower extremity.  The patient was in [place] wraps for several weeks.  Today, these blisters are completely healed and not well visualized.  She still continues to have some swelling but there is no weeping or significant discomfort that was present previously.  Patient does have a lymphedema pump and she has been utilizing these on a regular basis.  She also continues with conservative therapy involving compression socks, elevation and activity when possible.  She denies any fevers or chills.  Overall it appears she is doing well.    Review of Systems  Cardiovascular: Positive for leg swelling.  Skin: Negative for wound.  All other systems reviewed and are negative.      Objective:   Physical Exam Vitals reviewed.  HENT:     Head: Normocephalic.  Cardiovascular:     Rate and Rhythm: Normal rate.     Pulses: Decreased pulses.  Pulmonary:     Effort: Pulmonary effort is normal.  Skin:    General: Skin is warm and dry.  Neurological:     Mental Status: She is alert and oriented to person, place, and time.  Psychiatric:        Mood and Affect: Mood normal.        Behavior: Behavior normal.        Thought Content: Thought content normal.        Judgment: Judgment normal.     BP 121/66 (BP Location: Right Arm)   Pulse 70   Resp 16   Wt 199 lb 12.8 oz (90.6 kg)   LMP  (LMP Unknown)   BMI 34.30 kg/m   Past Medical History:  Diagnosis Date  . Chronic kidney disease   . Coronary artery disease    MI, pci to mid LAD 2014. LHC 80%OM, 60%RCA, 90% LAD  . Diabetes mellitus without complication ([place])   . Hypertension   . Sleep apnea     Social  History   Socioeconomic History  . Marital status: Single    Spouse name: Not on file  . Number of children: Not on file  . Years of education: Not on file  . Highest education level: Not on file  Occupational History  . Not on file  Tobacco Use  . Smoking status: Never Smoker  . Smokeless tobacco: Never Used  Vaping Use  . Vaping Use: Never used  Substance and Sexual Activity  . Alcohol use: Never  . Drug use: Never  . Sexual activity: Not on file  Other Topics Concern  . Not on file  Social History Narrative   Used to live in [place] Determinants of Health   Financial Resource Strain: Low Risk   . Difficulty of Paying Living Expenses: Not hard at all  Food Insecurity: No Food Insecurity  . Worried About Charity fundraiser in the Last Year: Never true  . Ran Out of Food in the Last Year: Never true  Transportation Needs: No Transportation Needs  . Lack of Transportation (Medical): No  . Lack of Transportation (Non-Medical): No  Physical Activity:  Not on file  Stress: No Stress Concern Present  . Feeling of Stress : Not at all  Social Connections: Unknown  . Frequency of Communication with Friends and Family: More than three times a week  . Frequency of Social Gatherings with Friends and Family: More than three times a week  . Attends Religious Services: More than 4 times per year  . Active Member of Clubs or Organizations: Yes  . Attends Archivist Meetings: Not on file  . Marital Status: Not on file  Intimate Partner Violence: Not At Risk  . Fear of Current or Ex-Partner: No  . Emotionally Abused: No  . Physically Abused: No  . Sexually Abused: No    Past Surgical History:  Procedure Laterality Date  . ACNE CYST REMOVAL  2008   Groin area  . CARDIAC CATHETERIZATION    . COLONOSCOPY WITH PROPOFOL N/A 05/27/2020   Procedure: COLONOSCOPY WITH PROPOFOL;  Surgeon: [name], MD;  Location[facility] END[facility]COPY;  Service:  Gastroenterology;  Laterality: N/A;  . CORONARY ANGIOPLASTY      Family History  Problem Relation Age of Onset  . Diabetes Mother   . Heart disease Father   . Breast cancer Neg Hx     Allergies  Allergen Reactions  . Amoxicillin Itching    CBC Latest Ref Rng & Units 06/16/2020 04/24/2020 01/15/2020  WBC 4.0 - 10.5 K/uL 8.3 12.3(H) 8.0  Hemoglobin 12.0 - 15.0 g/dL 12.2 12.0 10.9(L)  Hematocrit 36.0 - 46.0 % 38.0 36.9 32.8(L)  Platelets 150.0 - 400.0 K/uL 202.0 200 305.0      CMP     Component Value Date/Time   NA 142 06/02/2020 1103   K 4.4 06/02/2020 1103   CL 104 06/02/2020 1103   CO2 29 06/02/2020 1103   GLUCOSE 130 (H) 06/02/2020 1103   BUN 26 (H) 06/02/2020 1103   CREATININE 1.60 (H) 06/02/2020 1103   CALCIUM 9.2 06/02/2020 1103   PROT 7.5 06/16/2020 0928   ALBUMIN 4.2 06/16/2020 0928   AST 14 06/16/2020 0928   ALT 9 06/16/2020 0928   ALKPHOS 117 06/16/2020 0928   BILITOT 0.4 06/16/2020 0928   GFRNONAA 31 (L) 06/02/2020 1103   GFRAA 37 (L) 04/24/2020 1119     No results found.     Assessment & Plan:   1. Lymphedema  No surgery or intervention at this point in time.    I have reviewed my discussion with the patient regarding lymphedema and why it  causes symptoms.  Patient will continue wearing graduated compression stockings class 1 (20-30 mmHg) on a daily basis a prescription was given. The patient is reminded to put the stockings on first thing in the morning and removing them in the evening. The patient is instructed specifically not to sleep in the stockings.   In addition, behavioral modification throughout the day will be continued.  This will include frequent elevation (such as in a recliner), use of over the counter pain medications as needed and exercise such as walking.  I have reviewed systemic causes for chronic edema such as liver, kidney and cardiac etiologies and there does not appear to be any significant changes in these organ systems over  the past year.  The patient is under the impression that these organ systems are all stable and unchanged.    The patient will continue aggressive use of the  lymph pump.  This will continue to improve the edema control and prevent sequela such as ulcers  and infections.   The patient will follow-up in 6 months, provided no changes.   2. Essential (primary) hypertension Continue antihypertensive medications as already ordered, these medications have been reviewed and there are no changes at this time.   3. Venous ulcer ([place]) Currently the ulcers have resolved however patient is advised to contact us if they recur.  Patient will follow with conservative therapy as outlined above.   Current Outpatient Medications on File Prior to Visit  Medication Sig Dispense Refill  . acetaminophen (TYLENOL) 650 MG CR tablet Take 650 mg by mouth 3 (three) times daily as needed.    [name] albuterol (VENTOLIN HFA) 108 (90 Base) MCG/ACT inhaler Inhale 1-2 puffs into the lungs every 6 (six) hours as needed for wheezing or shortness of breath. 18 g 2  . amLODipine (NORVASC) 5 MG tablet Take 1 tablet (5 mg total) by mouth daily. 90 tablet 0  . aspirin EC 81 MG tablet Take 81 mg by mouth daily.    [name] atorvastatin (LIPITOR) 40 MG tablet Take 1 tablet (40 mg total) by mouth daily. 90 tablet 0  . dicyclomine (BENTYL) 10 MG capsule Take 1 capsule (10 mg total) by mouth 4 (four) times daily -  before meals and at bedtime. 15 capsule 0  . furosemide (LASIX) 40 MG tablet Take 1 tablet (40 mg total) by mouth in the morning and at bedtime. 60 tablet 3  . Glucose Blood (BLOOD GLUCOSE TEST STRIPS) STRP E11.9 check sugar tid for designated machine 300 strip 5  . insulin degludec (TRESIBA FLEXTOUCH) 200 UNIT/ML FlexTouch Pen Inject 40 Units into the skin daily. 30 mL 3  . insulin lispro (HUMALOG KWIKPEN) 100 UNIT/ML KwikPen Prn based on sugar If sugar 70-130 0 units If sugar 131-180 4 units If sugar 181-240 8 units If sugar 241-300  10 units If sugar 301-350 12 units If sugar 351-400 16 units If >400 20 units and call doctor 15 mL 11  . Insulin Pen Needle (PEN NEEDLES) 31G X 8 MM MISC 1 pen by Does not apply route 2 (two) times daily as needed. 100 each 3  . Insulin Pen Needle 32G X 4 MM MISC 1 Device by Does not apply route 4 (four) times daily - after meals and at bedtime. 360 each 3  . latanoprost (XALATAN) 0.005 % ophthalmic solution Place 1 drop into the left eye at bedtime.    [name] losartan (COZAAR) 100 MG tablet Take 100 mg by mouth daily.    . Turmeric (QC TUMERIC COMPLEX PO) Take by mouth.     . carvedilol (COREG) 25 MG tablet Take 1 tablet (25 mg total) by mouth 2 (two) times daily. 180 tablet 3   No current facility-administered medications on file prior to visit.    There are no Patient Instructions on file for this visit. No follow-ups on file.   Kris Hartmann, NP

## 2020-12-23 ENCOUNTER — Other Ambulatory Visit: Payer: Self-pay

## 2020-12-23 MED ORDER — ATORVASTATIN CALCIUM 40 MG PO TABS: 40.0000 mg | ORAL_TABLET | Freq: Every day | ORAL | 0 refills | Status: DC

## 2020-12-27 ENCOUNTER — Telehealth: Payer: Self-pay

## 2020-12-27 DIAGNOSIS — N1832 Chronic kidney disease, stage 3b: Secondary | ICD-10-CM | POA: Diagnosis not present

## 2020-12-27 DIAGNOSIS — N2581 Secondary hyperparathyroidism of renal origin: Secondary | ICD-10-CM | POA: Diagnosis not present

## 2020-12-27 DIAGNOSIS — I129 Hypertensive chronic kidney disease with stage 1 through stage 4 chronic kidney disease, or unspecified chronic kidney disease: Secondary | ICD-10-CM | POA: Diagnosis not present

## 2020-12-27 DIAGNOSIS — E1122 Type 2 diabetes mellitus with diabetic chronic kidney disease: Secondary | ICD-10-CM | POA: Diagnosis not present

## 2020-12-27 NOTE — Telephone Encounter (Signed)
Patient is aware of date/time of covid test prior to PFT//ms

## 2020-12-29 ENCOUNTER — Other Ambulatory Visit: Payer: Self-pay

## 2020-12-29 ENCOUNTER — Other Ambulatory Visit: Admission: RE | Admit: 2020-12-29 | Payer: Medicare Other | Source: Ambulatory Visit

## 2020-12-29 ENCOUNTER — Other Ambulatory Visit
Admission: RE | Admit: 2020-12-29 | Discharge: 2020-12-29 | Disposition: A | Discharge status: Home / Self Care | Payer: Medicare Other | Source: Ambulatory Visit | Attending: Primary Care | Admitting: Primary Care

## 2020-12-29 DIAGNOSIS — Z20822 Contact with and (suspected) exposure to covid-19: Secondary | ICD-10-CM | POA: Insufficient documentation

## 2020-12-29 DIAGNOSIS — Z01812 Encounter for preprocedural laboratory examination: Secondary | ICD-10-CM | POA: Insufficient documentation

## 2020-12-29 LAB — SARS CORONAVIRUS 2 (TAT 6-24 HRS): SARS Coronavirus 2: NEGATIVE

## 2020-12-30 ENCOUNTER — Ambulatory Visit: Payer: Medicare Other | Attending: Primary Care

## 2020-12-30 DIAGNOSIS — R0600 Dyspnea, unspecified: Secondary | ICD-10-CM | POA: Diagnosis not present

## 2020-12-30 DIAGNOSIS — R0609 Other forms of dyspnea: Secondary | ICD-10-CM

## 2020-12-30 MED ORDER — ALBUTEROL SULFATE (2.5 MG/3ML) 0.083% IN NEBU
2.5000 mg | INHALATION_SOLUTION | Freq: Once | RESPIRATORY_TRACT | Status: AC
  Administered 2020-12-30: 2.5 mg via RESPIRATORY_TRACT
  Filled 2020-12-30: qty 3

## 2020-12-31 NOTE — Progress Notes (Signed)
PFTs showed moderate restriction with diffusion defect. Please make sure she has a follow up with her primary pulmonologist in [place]. I had said 3 months back in January

## 2021-01-02 DIAGNOSIS — E1165 Type 2 diabetes mellitus with hyperglycemia: Secondary | ICD-10-CM | POA: Diagnosis not present

## 2021-01-09 DIAGNOSIS — G4733 Obstructive sleep apnea (adult) (pediatric): Secondary | ICD-10-CM | POA: Diagnosis not present

## 2021-01-17 ENCOUNTER — Ambulatory Visit: Payer: Medicare Other | Admitting: Cardiology

## 2021-01-21 ENCOUNTER — Other Ambulatory Visit: Payer: Self-pay

## 2021-01-21 ENCOUNTER — Ambulatory Visit: Payer: Medicare Other | Admitting: Cardiology

## 2021-01-21 ENCOUNTER — Encounter: Payer: Self-pay | Admitting: Cardiology

## 2021-01-21 VITALS — BP 181/73 | HR 70 | Ht 65.0 in | Wt 210.0 lb

## 2021-01-21 DIAGNOSIS — I2510 Atherosclerotic heart disease of native coronary artery without angina pectoris: Secondary | ICD-10-CM | POA: Diagnosis not present

## 2021-01-21 DIAGNOSIS — I10 Essential (primary) hypertension: Secondary | ICD-10-CM

## 2021-01-21 DIAGNOSIS — I5189 Other ill-defined heart diseases: Secondary | ICD-10-CM

## 2021-01-21 DIAGNOSIS — I272 Pulmonary hypertension, unspecified: Secondary | ICD-10-CM

## 2021-01-21 NOTE — Progress Notes (Signed)
Cardiology Office Note:    Date:  01/21/2021   ID:  Kaori Jumper, DOB [birth year], MRN [id number]  PCP:  McLean-Scocuzza, Nino Glow, MD  Cardiologist:  Kate Sable, MD  Electrophysiologist:  None   Referring MD: McLean-Scocuzza, Olivia Mackie *   Chief Complaint  Patient presents with   Other    6 month follow up. Meds reviewed verbally with patient.     History of Present Illness:    Vickie Kemp is a 80 y.o. female with a hx of HFpEF, hypertension, CKD, OSA, obesity, CAD/MI (status post DES to mLAD 04/2013 [facility] [place]), pulmonary hypertension who presents for follow-up.  Patient being seen due to diastolic dysfunction and edema.  Started on Lasix with good effect.  Still has shortness of breath with minimal exertion such as walking around her apartment.  Denies chest pain.  Last echo with severe pulmonary hypertension.  Had a recent pulmonary function test last month showing moderate restriction and diffusion defect.  Plans to follow up with pulmonary medicine.  May have ILD.  Blood pressure frequently checked at home, usually well controlled.   Prior notes Echocardiogram on 01/1442 normal systolic function, EF 55 to 15%, grade 2 diastolic dysfunction, severely elevated pulmonary artery systolic pressure, 40.0 mmHg Per records, angiogram in 2014 showed 90% mid LAD lesion, 80% OM, 60% RCA s/p DES to mid LAD 2014.     Past Medical History:  Diagnosis Date   Chronic kidney disease    Coronary artery disease    MI, pci to mid LAD 2014. LHC 80%OM, 60%RCA, 90% LAD   Diabetes mellitus without complication ([facility])    Hypertension    Sleep apnea     Past Surgical History:  Procedure Laterality Date   ACNE CYST REMOVAL  2008   Groin area   CARDIAC CATHETERIZATION     COLONOSCOPY WITH PROPOFOL N/A 05/27/2020   Procedure: COLONOSCOPY WITH PROPOFOL;  Surgeon: [name], MD;  Location: [facility] ENDOSCOPY;  Service: Gastroenterology;   Laterality: N/A;   CORONARY ANGIOPLASTY      Current Medications: Current Meds  Medication Sig   acetaminophen (TYLENOL) 650 MG CR tablet Take 650 mg by mouth 3 (three) times daily as needed.   amLODipine (NORVASC) 5 MG tablet Take 1 tablet (5 mg total) by mouth daily.   aspirin EC 81 MG tablet Take 81 mg by mouth daily.   atorvastatin (LIPITOR) 40 MG tablet Take 1 tablet (40 mg total) by mouth daily. Please schedule office visit for further refills. Thank you!   carvedilol (COREG) 25 MG tablet Take 1 tablet (25 mg total) by mouth 2 (two) times daily.   dicyclomine (BENTYL) 10 MG capsule Take 1 capsule (10 mg total) by mouth 4 (four) times daily -  before meals and at bedtime.   furosemide (LASIX) 40 MG tablet Take 1 tablet (40 mg total) by mouth in the morning and at bedtime.   Glucose Blood (BLOOD GLUCOSE TEST STRIPS) STRP E11.9 check sugar tid for designated machine   insulin degludec (TRESIBA FLEXTOUCH) 200 UNIT/ML FlexTouch Pen Inject 40 Units into the skin daily.   insulin lispro (HUMALOG KWIKPEN) 100 UNIT/ML KwikPen Prn based on sugar If sugar 70-130 0 units If sugar 131-180 4 units If sugar 181-240 8 units If sugar 241-300 10 units If sugar 301-350 12 units If sugar 351-400 16 units If >400 20 units and call doctor   Insulin Pen Needle (PEN NEEDLES) 31G X 8 MM MISC 1  pen by Does not apply route 2 (two) times daily as needed.   Insulin Pen Needle 32G X 4 MM MISC 1 Device by Does not apply route 4 (four) times daily - after meals and at bedtime.   latanoprost (XALATAN) 0.005 % ophthalmic solution Place 1 drop into the left eye at bedtime.   losartan (COZAAR) 100 MG tablet Take 100 mg by mouth daily.   Turmeric (QC TUMERIC COMPLEX PO) Take by mouth.      Allergies:   Amoxicillin   Social History   Socioeconomic History   Marital status: Single    Spouse name: Not on file   Number of children: Not on file   Years of education: Not on file   Highest education level: Not on file   Occupational History   Not on file  Tobacco Use   Smoking status: Never   Smokeless tobacco: Never  Vaping Use   Vaping Use: Never used  Substance and Sexual Activity   Alcohol use: Never   Drug use: Never   Sexual activity: Not on file  Other Topics Concern   Not on file  Social History Narrative   Used to live in [place]    Social Determinants of Health   Financial Resource Strain: Low Risk    Difficulty of Paying Living Expenses: Not hard at all  Food Insecurity: No Food Insecurity   Worried About Charity fundraiser in the Last Year: Never true   Arboriculturist in the Last Year: Never true  Transportation Needs: No Transportation Needs   Lack of Transportation (Medical): No   Lack of Transportation (Non-Medical): No  Physical Activity: Not on file  Stress: No Stress Concern Present   Feeling of Stress : Not at all  Social Connections: Unknown   Frequency of Communication with Friends and Family: More than three times a week   Frequency of Social Gatherings with Friends and Family: More than three times a week   Attends Religious Services: More than 4 times per year   Active Member of [employer] or Organizations: Yes   Attends Archivist Meetings: Not on file   Marital Status: Not on file     Family History: The patient's family history includes Diabetes in her mother; Heart disease in her father. There is no history of Breast cancer.  ROS:   Please see the history of present illness.     All other systems reviewed and are negative.  EKGs/Labs/Other Studies Reviewed:    The following studies were reviewed today: TTE 2019/07/03 1. Left ventricular ejection fraction, by visual estimation, is 60 to 65%. The left ventricle has normal function. There is no left ventricular hypertrophy.  2. Left ventricular diastolic parameters are consistent with Grade I diastolic dysfunction (impaired relaxation).  3. Global right ventricle has normal systolic  function.The right ventricular size is normal. No increase in right ventricular wall thickness.  4. Left atrial size was mildly dilated.  5. Mild to moderate mitral valve regurgitation.  6. Tricuspid valve regurgitation mild-moderate.  7. The pulmonic valve was normal in structure. Pulmonic valve regurgitation is not visualized.  8. Moderately elevated pulmonary artery systolic pressure.  9. The tricuspid regurgitant velocity is 3.28 m/s, and with an assumed right atrial pressure of 10 mmHg, the estimated right ventricular systolic pressure is moderately elevated at 53.0 mmHg.  EKG:  EKG is ordered today. Normal sinus rhythm, first-degree AV block.  Recent Labs:  04/24/2020: B Natriuretic Peptide 79.0; Magnesium 1.7 06/02/2020: BUN 26; Creatinine, Ser 1.60; Potassium 4.4; Sodium 142 06/16/2020: ALT 9; Hemoglobin 12.2; Platelets 202.0; TSH 2.12  Recent Lipid Panel    Component Value Date/Time   CHOL 106 06/16/2020 0928   TRIG 70.0 06/16/2020 0928   HDL 29.50 (L) 06/16/2020 0928   CHOLHDL 4 06/16/2020 0928   VLDL 14.0 06/16/2020 0928   LDLCALC 62 06/16/2020 0928    Physical Exam:    VS:  BP (!) 181/73 (BP Location: Right Arm, Patient Position: Sitting, Cuff Size: Large)   Pulse 70   Ht 5\' 5"  (1.651 m)   Wt 210 lb (95.3 kg)   LMP  (LMP Unknown)   SpO2 94%   BMI 34.95 kg/m     Wt Readings from Last 3 Encounters:  01/21/21 210 lb (95.3 kg)  12/16/20 199 lb 12.8 oz (90.6 kg)  11/25/20 197 lb (89.4 kg)     GEN:  Well nourished, well developed in no acute distress HEENT: Normal NECK: No JVD; No carotid bruits LYMPHATICS: No lymphadenopathy CARDIAC: RRR, no murmurs, rubs, gallops RESPIRATORY: Diminished breath sounds at bases ABDOMEN: Soft, non-tender, non-distended MUSCULOSKELETAL: 1+ pitting edema; No deformity  SKIN: Warm and dry NEUROLOGIC:  Alert and oriented x 3 PSYCHIATRIC:  Normal affect   ASSESSMENT:    1. Diastolic dysfunction   2. Pulmonary HTN ([place])   3.  Coronary artery disease involving native coronary artery of native heart without angina pectoris   4. Hypertension, unspecified type     PLAN:    Patient with improved lower extremity edema, diastolic dysfunction. Continue Lasix 40 mg twice daily.  History of severe pulmonary hypertension with PASP of 40mmHg.  Etiology  likely multifactorial (OSA-hypoxia pathway, diastolic dysfunction (LVDD-PH)). Continue CPAP for OSA, Lasix for diastolic dysfunction as above. Patient declined [place].  May have ILD as per recent pulmonary function test.  Recommend follow-up with pulmonary medicine for additional input. History of CAD status post PCI to mid LAD.  Denies chest pain continue aspirin, Lipitor. hx of hypertension. BP elevated today, usually controlled.  Continue losartan, Norvasc, Coreg for now, may consider titration if BP stays elevated..  Follow-up in 6 months  Total encounter time 40 minutes  Greater than 50% was spent in counseling and coordination of care with the patient   Medication Adjustments/Labs and Tests Ordered: Current medicines are reviewed at length with the patient today.  Concerns regarding medicines are outlined above.  Orders Placed This Encounter  Procedures   EKG 12-Lead    No orders of the defined types were placed in this encounter.   Patient Instructions  Medication Instructions:  Your physician recommends that you continue on your current medications as directed. Please refer to the Current Medication list given to you today.  *If you need a refill on your cardiac medications before your next appointment, please call your pharmacy*   Lab Work: None ordered If you have labs (blood work) drawn today and your tests are completely normal, you will receive your results only by: [place] (if you have MyChart) OR A paper copy in the mail If you have any lab test that is abnormal or we need to change your treatment, we will call you to review the  results.   Testing/Procedures: None ordered   Follow-Up: At [facility], you and your health needs are our priority.  As part of our continuing mission to provide you with exceptional heart care, we have created designated Provider Care  Teams.  These Care Teams include your primary Cardiologist (physician) and Advanced Practice Providers (APPs -  Physician Assistants and Nurse Practitioners) who all work together to provide you with the care you need, when you need it.  We recommend signing up for the patient portal called "MyChart".  Sign up information is provided on this After Visit Summary.  MyChart is used to connect with patients for Virtual Visits (Telemedicine).  Patients are able to view lab/test results, encounter notes, upcoming appointments, etc.  Non-urgent messages can be sent to your provider as well.   To learn more about what you can do with MyChart, go to [email or web address].    Your next appointment:   6 month(s)  The format for your next appointment:   In Person  Provider:   Kate Sable, MD   Other Instructions     Signed, Kate Sable, MD  01/21/2021 4:41 PM    [place]

## 2021-01-21 NOTE — Patient Instructions (Signed)

## 2021-01-24 ENCOUNTER — Telehealth: Payer: Self-pay | Admitting: Internal Medicine

## 2021-01-24 ENCOUNTER — Other Ambulatory Visit: Payer: Self-pay | Admitting: *Deleted

## 2021-01-24 MED ORDER — FUROSEMIDE 40 MG PO TABS: 40.0000 mg | ORAL_TABLET | Freq: Two times a day (BID) | ORAL | 6 refills | Status: DC

## 2021-01-24 MED ORDER — AMLODIPINE BESYLATE 5 MG PO TABS: 5.0000 mg | ORAL_TABLET | Freq: Every day | ORAL | 2 refills | Status: DC

## 2021-01-24 NOTE — Telephone Encounter (Signed)
PFTs showed moderate restriction with diffusion defect. Please make sure she has a follow up with her primary pulmonologist in [place]. I had said 51months back in January   Patient is aware of results and voiced her understanding. Pending OV for 02/23/2021 with Dr. Mortimer Fries. Nothing further needed at this time.

## 2021-01-31 ENCOUNTER — Ambulatory Visit (INDEPENDENT_AMBULATORY_CARE_PROVIDER_SITE_OTHER): Payer: Medicare Other | Admitting: Nurse Practitioner

## 2021-02-02 ENCOUNTER — Ambulatory Visit: Payer: Medicare Other | Admitting: Internal Medicine

## 2021-02-02 DIAGNOSIS — E1165 Type 2 diabetes mellitus with hyperglycemia: Secondary | ICD-10-CM | POA: Diagnosis not present

## 2021-02-03 ENCOUNTER — Encounter: Payer: Self-pay | Admitting: Internal Medicine

## 2021-02-09 DIAGNOSIS — G4733 Obstructive sleep apnea (adult) (pediatric): Secondary | ICD-10-CM | POA: Diagnosis not present

## 2021-02-10 ENCOUNTER — Telehealth: Payer: Self-pay | Admitting: *Deleted

## 2021-02-10 NOTE — Telephone Encounter (Signed)
Please place future orders for lab appt.  

## 2021-02-11 ENCOUNTER — Other Ambulatory Visit: Payer: Self-pay | Admitting: Internal Medicine

## 2021-02-11 DIAGNOSIS — N3000 Acute cystitis without hematuria: Secondary | ICD-10-CM

## 2021-02-11 DIAGNOSIS — I152 Hypertension secondary to endocrine disorders: Secondary | ICD-10-CM

## 2021-02-15 ENCOUNTER — Other Ambulatory Visit: Payer: Self-pay

## 2021-02-15 ENCOUNTER — Other Ambulatory Visit (INDEPENDENT_AMBULATORY_CARE_PROVIDER_SITE_OTHER): Payer: Medicare Other

## 2021-02-15 DIAGNOSIS — E1159 Type 2 diabetes mellitus with other circulatory complications: Secondary | ICD-10-CM

## 2021-02-15 DIAGNOSIS — I152 Hypertension secondary to endocrine disorders: Secondary | ICD-10-CM

## 2021-02-15 DIAGNOSIS — N3000 Acute cystitis without hematuria: Secondary | ICD-10-CM | POA: Diagnosis not present

## 2021-02-15 LAB — CBC WITH DIFFERENTIAL/PLATELET
Basophils Absolute: 0.1 10*3/uL (ref 0.0–0.1)
Basophils Relative: 0.7 % (ref 0.0–3.0)
Eosinophils Absolute: 0.2 10*3/uL (ref 0.0–0.7)
Eosinophils Relative: 2.6 % (ref 0.0–5.0)
HCT: 37.7 % (ref 36.0–46.0)
Hemoglobin: 12.3 g/dL (ref 12.0–15.0)
Lymphocytes Relative: 26.8 % (ref 12.0–46.0)
Lymphs Abs: 2.2 10*3/uL (ref 0.7–4.0)
MCHC: 32.7 g/dL (ref 30.0–36.0)
MCV: 86.7 fl (ref 78.0–100.0)
Monocytes Absolute: 0.7 10*3/uL (ref 0.1–1.0)
Monocytes Relative: 8.7 % (ref 3.0–12.0)
Neutro Abs: 5 10*3/uL (ref 1.4–7.7)
Neutrophils Relative %: 61.2 % (ref 43.0–77.0)
Platelets: 183 10*3/uL (ref 150.0–400.0)
RBC: 4.35 Mil/uL (ref 3.87–5.11)
RDW: 14.8 % (ref 11.5–15.5)
WBC: 8.2 10*3/uL (ref 4.0–10.5)

## 2021-02-15 LAB — COMPREHENSIVE METABOLIC PANEL
ALT: 9 U/L (ref 0–35)
AST: 14 U/L (ref 0–37)
Albumin: 4.1 g/dL (ref 3.5–5.2)
Alkaline Phosphatase: 97 U/L (ref 39–117)
BUN: 18 mg/dL (ref 6–23)
CO2: 31 mEq/L (ref 19–32)
Calcium: 9.3 mg/dL (ref 8.4–10.5)
Chloride: 105 mEq/L (ref 96–112)
Creatinine, Ser: 1.49 mg/dL — ABNORMAL HIGH (ref 0.40–1.20)
GFR: 33.27 mL/min — ABNORMAL LOW (ref >60.00)
Glucose, Bld: 84 mg/dL (ref 70–99)
Potassium: 3.9 mEq/L (ref 3.5–5.1)
Sodium: 142 mEq/L (ref 135–145)
Total Bilirubin: 0.4 mg/dL (ref 0.2–1.2)
Total Protein: 7.2 g/dL (ref 6.0–8.3)

## 2021-02-15 LAB — LIPID PANEL
Cholesterol: 79 mg/dL (ref 0–200)
HDL: 26.7 mg/dL — ABNORMAL LOW (ref >39.00)
LDL Cholesterol: 42 mg/dL (ref 0–99)
NonHDL: 52.42
Total CHOL/HDL Ratio: 3
Triglycerides: 50 mg/dL (ref 0.0–149.0)
VLDL: 10 mg/dL (ref 0.0–40.0)

## 2021-02-15 LAB — HEMOGLOBIN A1C: Hgb A1c MFr Bld: 7.8 % — ABNORMAL HIGH (ref 4.6–6.5)

## 2021-02-15 NOTE — Addendum Note (Signed)
Addended by: Neta Ehlers on: 02/15/2021 10:11 AM   Modules accepted: Orders

## 2021-02-17 ENCOUNTER — Ambulatory Visit (INDEPENDENT_AMBULATORY_CARE_PROVIDER_SITE_OTHER): Payer: Medicare Other | Admitting: Internal Medicine

## 2021-02-17 ENCOUNTER — Ambulatory Visit (INDEPENDENT_AMBULATORY_CARE_PROVIDER_SITE_OTHER): Payer: Medicare Other | Admitting: Pharmacist

## 2021-02-17 ENCOUNTER — Encounter: Payer: Self-pay | Admitting: Internal Medicine

## 2021-02-17 ENCOUNTER — Other Ambulatory Visit: Payer: Self-pay

## 2021-02-17 VITALS — BP 140/70 | HR 68 | Temp 97.8°F | Ht 65.0 in | Wt 200.4 lb

## 2021-02-17 DIAGNOSIS — I25119 Atherosclerotic heart disease of native coronary artery with unspecified angina pectoris: Secondary | ICD-10-CM | POA: Diagnosis not present

## 2021-02-17 DIAGNOSIS — M5416 Radiculopathy, lumbar region: Secondary | ICD-10-CM

## 2021-02-17 DIAGNOSIS — I152 Hypertension secondary to endocrine disorders: Secondary | ICD-10-CM | POA: Diagnosis not present

## 2021-02-17 DIAGNOSIS — E785 Hyperlipidemia, unspecified: Secondary | ICD-10-CM

## 2021-02-17 DIAGNOSIS — E782 Mixed hyperlipidemia: Secondary | ICD-10-CM | POA: Diagnosis not present

## 2021-02-17 DIAGNOSIS — I10 Essential (primary) hypertension: Secondary | ICD-10-CM | POA: Diagnosis not present

## 2021-02-17 DIAGNOSIS — N1832 Chronic kidney disease, stage 3b: Secondary | ICD-10-CM

## 2021-02-17 DIAGNOSIS — N3000 Acute cystitis without hematuria: Secondary | ICD-10-CM

## 2021-02-17 DIAGNOSIS — M542 Cervicalgia: Secondary | ICD-10-CM | POA: Diagnosis not present

## 2021-02-17 DIAGNOSIS — E1159 Type 2 diabetes mellitus with other circulatory complications: Secondary | ICD-10-CM

## 2021-02-17 MED ORDER — TRESIBA FLEXTOUCH 200 UNIT/ML ~~LOC~~ SOPN: 36.0000 [IU] | PEN_INJECTOR | Freq: Every day | SUBCUTANEOUS | 3 refills | Status: DC

## 2021-02-17 MED ORDER — TRAMADOL HCL 50 MG PO TABS: 50.0000 mg | ORAL_TABLET | Freq: Two times a day (BID) | ORAL | 0 refills | Status: DC | PRN

## 2021-02-17 MED ORDER — LOSARTAN POTASSIUM 100 MG PO TABS: 100.0000 mg | ORAL_TABLET | Freq: Every day | ORAL | 3 refills | Status: DC

## 2021-02-17 MED ORDER — ATORVASTATIN CALCIUM 40 MG PO TABS: 40.0000 mg | ORAL_TABLET | Freq: Every day | ORAL | 3 refills | Status: DC

## 2021-02-17 NOTE — Patient Instructions (Addendum)
Glucose tablets over the counter 55 ounces water daily  Consider 4th covid 19 shot  [employer]   Tramadol Tablets What is this medication? TRAMADOL (TRA ma dole) treats severe pain. It is prescribed when other pain medications have not worked or cannot be tolerated. It works by blocking painsignals in the brain. It belongs to a group of medications called opioids. This medicine may be used for other purposes; ask your health care provider orpharmacist if you have questions. COMMON BRAND NAME(S): Ultram What should I tell my care team before I take this medication? They need to know if you have any of these conditions: Brain tumor Drug abuse or addiction Head injury If you often drink alcohol Kidney disease Liver disease Lung disease, asthma, or breathing problems Seizures Stomach or intestine problems Suicidal thoughts, plans or attempt; a previous suicide attempt by you or a family member Taken an MAOI like Marplan, Nardil, or Parnate in the last 14 days An unusual or allergic reaction to tramadol, other medications, foods, dyes, or preservatives Pregnant or trying to get pregnant Breast-feeding How should I use this medication? Take this medication by mouth with a full glass of water. Follow the directions on the prescription label. You can take it with or without food. If it upsets your stomach, take it with food. Do not take your medication more often thandirected. A special MedGuide will be given to you by the pharmacist with eachprescription and refill. Be sure to read this information carefully each time. Talk to your care team regarding the use of this medication in children.Special care may be needed. Overdosage: If you think you have taken too much of this medicine contact apoison control center or emergency room at once. NOTE: This medicine is only for you. Do not share this medicine with others. What if I miss a dose? If you miss a dose, take it as soon as you can. If it is  almost time for yournext dose, take only that dose. Do not take double or extra doses. What may interact with this medication? Do not take this medication with any of the following: Linezolid MAOIs like Marplan, Nardil, and Parnate Methylene blue Ozanimod This medication may also interact with the following: Alcohol Antihistamines for allergy, cough, and cold Atropine Certain antibiotics like erythromycin, clarithromycin, rifampin Certain antivirals for HIV or hepatitis Certain medications for anxiety or sleep Certain medications for bladder problems like oxybutynin, tolterodine Certain medications for depression like amitriptyline, bupropion, fluoxetine, paroxetine, sertraline Certain medications for fungal infections like ketoconazole, itraconazole, or posaconazole Certain medications for migraine headache like almotriptan, eletriptan, frovatriptan, naratriptan, rizatriptan, sumatriptan, zolmitriptan Certain medications for Parkinson's disease like benztropine, trihexyphenidyl Certain medications for seizures like carbamazepine, phenobarbital, primidone Certain medications for stomach problems like dicyclomine, hyoscyamine Certain medications for travel sickness like scopolamine Digoxin Diuretics General anesthetics like halothane, isoflurane, methoxyflurane, propofol Ipratropium Medications that relax muscles for surgery Other narcotic medications for pain Phenothiazines like chlorpromazine, mesoridazine, prochlorperazine, thioridazine Quinidine Warfarin This list may not describe all possible interactions. Give your health care provider a list of all the medicines, herbs, non-prescription drugs, or dietary supplements you use. Also tell them if you smoke, drink alcohol, or use illegaldrugs. Some items may interact with your medicine. What should I watch for while using this medication? Tell your care team if your pain does not go away, if it gets worse, or if you have new or a  different type of pain. You may develop tolerance to this medication. Tolerance means that you  will need a higher dose of the medication for pain relief. Tolerance is normal and is expected if you take thismedication for a long time. Do not suddenly stop taking your medication because you may develop a severe reaction. Your body becomes used to the medication. This does NOT mean you are addicted. Addiction is a behavior related to getting and using a medication for a nonmedical reason. If you have pain, you have a medical reason to take pain medication. Your care team will tell you how much medication to take. If your care team wants you to stop the medication, the dose will be slowly loweredover time to avoid any side effects. If you take other medications that also cause drowsiness like other narcotic pain medications, benzodiazepines, or other medications for sleep, you may have more side effects. Give your care team a list of all medications you use. He or she will tell you how much medication to take. Do not take more medication than directed. Call emergency services if you have trouble breathing or areunusually tired or sleepy. Talk to your care team about naloxone and how to get it. Naloxone is an emergency medication used for an opioid overdose. An overdose can happen if you take too much opioid. It can also happen if an opioid is taken with some other medications or substances, like alcohol. Know the symptoms of an overdose, like trouble breathing, unusually tired or sleepy, or not being able to respond or wake up. Make sure to tell caregivers and close contacts where it is stored. Make sure they know how to use it. After naloxone is given, you must call emergency services. Naloxone is a temporary treatment. Repeat doses may beneeded. This medication may cause serious skin reactions. They can happen weeks to months after starting the medication. Contact your care team right away if you notice fevers or  flu-like symptoms with a rash. The rash may be red or purple and then turn into blisters or peeling of the skin. Or, you might notice a red rash with swelling of the face, lips, or lymph nodes in your neck or under yourarms. You may get drowsy or dizzy. Do not drive, use machinery, or do anything that needs mental alertness until you know how this medication affects you. Do not stand up or sit up quickly, especially if you are an older patient. This reduces the risk of dizzy or fainting spells. Alcohol may interfere with theeffect of this medication. Avoid alcoholic drinks. This medication will cause constipation. If you do not have a bowel movementfor 3 days, call your care team. Your mouth may get dry. Chewing sugarless gum or sucking hard candy and drinking plenty of water may help. Contact your care team if the problem doesnot go away or is severe. What side effects may I notice from receiving this medication? Side effects that you should report to your care team as soon as possible: Allergic reactions-skin rash, itching, hives, swelling of the face, lips, tongue, or throat CNS depression-slow or shallow breathing, shortness of breath, feeling faint, dizziness, confusion, difficulty staying awake Low adrenal gland function-nausea, vomiting, loss of appetite, unusual weakness or fatigue, dizziness Low blood pressure-dizziness, feeling faint or lightheaded, blurry vision Low blood sugar (hypoglycemia)-tremors or shaking, anxiety, sweating, cold or clammy skin, confusion, dizziness, rapid heartbeat Low sodium level-muscle weakness, fatigue, dizziness, headache, confusion Redness, blistering, peeling, or loosening of the skin, including inside the mouth Seizures Side effects that usually do not require medical attention (report to your careteam  if they continue or are bothersome): Constipation Dizziness Drowsiness Dry mouth Headache Nausea Vomiting This list may not describe all possible side  effects. Call your doctor for medical advice about side effects. You may report side effects to FDA at1-800-FDA-1088. Where should I keep my medication? Keep out of the reach of children and pets. This medication can be abused. Keep it in a safe place to protect it from theft. Do not share it with anyone. It is only for you. Selling or giving away this medication is dangerous and againstthe law. Store at room temperature between 20 and 25 degrees C (68 and 77 degrees F).Get rid of any unused medication after the expiration date. This medication may cause harm and death if it is taken by other adults, children, or pets. It is important to get rid of the medication as soon as youno longer need it, or it is expired. You can do this in two ways: Take the medication to a medication take-back program. Check with your pharmacy or law enforcement to find a location. If you cannot return the medication, check the label or package insert to see if the medication should be thrown out in the garbage or flushed down the toilet. If you are not sure, ask your care team. If it is safe to put it in the trash, take the medication out of the container. Mix the medication with cat litter, dirt, coffee grounds, or other unwanted substance. Seal the mixture in a bag or container. Put it in the trash. NOTE: This sheet is a summary. It may not cover all possible information. If you have questions about this medicine, talk to your doctor, pharmacist, orhealth care provider.  2022 Elsevier/Gold Standard (2020-10-12 15:13:15)  Dapagliflozin tablets What is this medication? DAPAGLIFLOZIN (DAP a gli FLOE zin) controls blood sugar in people with diabetes. It is used with lifestyle changes like diet and exercise. It also treats heart failure and kidney disease. It may lower the risk for treatment ofheart failure in the hospital or worsened kidney disease. This medicine may be used for other purposes; ask your health care provider  orpharmacist if you have questions. COMMON BRAND NAME(S): Wilder Glade What should I tell my care team before I take this medication? They need to know if you have any of these conditions: dehydration diabetic ketoacidosis diet low in salt eating less due to illness, surgery, dieting, or any other reason having surgery history of pancreatitis or pancreas problems history of yeast infection of the penis or vagina if you often drink alcohol infection in the bladder, kidneys, or urinary tract kidney disease low blood pressure on dialysis problems urinating type 1 diabetes uncircumcised female an unusual or allergic reaction to dapagliflozin, other medicines, foods, dyes, or preservatives pregnant or trying to get pregnant breast-feeding How should I use this medication? Take this medicine by mouth with water. Take it as directed on the prescription label at the same time every day. You can take it with or without food. If it upsets your stomach, take it with food. Keep taking it unless your health careprovider tells you to stop. A special MedGuide will be given to you by the pharmacist with eachprescription and refill. Be sure to read this information carefully each time. Talk to your health care provider about the use of this medicine in children.Special care may be needed. Overdosage: If you think you have taken too much of this medicine contact apoison control center or emergency room at once. NOTE: This medicine is  only for you. Do not share this medicine with others. What if I miss a dose? If you miss a dose, take it as soon as you can. If it is almost time for yournext dose, take only that dose. Do not take double or extra doses. What may interact with this medication? Interactions are not expected. This list may not describe all possible interactions. Give your health care provider a list of all the medicines, herbs, non-prescription drugs, or dietary supplements you use. Also tell them  if you smoke, drink alcohol, or use illegaldrugs. Some items may interact with your medicine. What should I watch for while using this medication? Visit your health care provider for regular checks on your progress. Tell your health care provider if your symptoms do not start to get better or if they getworse. This medicine can cause a serious condition in which there is too much acid in the blood. If you develop nausea, vomiting, stomach pain, unusual tiredness, or breathing problems, stop taking this medicine and call your doctor right away.If possible, use a ketone dipstick to check for ketones in your urine. Check with your health care provider if you have severe diarrhea, nausea, and vomiting, or if you sweat a lot. The loss of too much body fluid may make itdangerous for you to take this medicine. A test called the HbA1C (A1C) will be monitored. This is a simple blood test. It measures your blood sugar control over the last 2 to 3 months. You willreceive this test every 3 to 6 months. Learn how to check your blood sugar. Learn the symptoms of low and high bloodsugar and how to manage them. Always carry a quick-source of sugar with you in case you have symptoms of low blood sugar. Examples include hard sugar candy or glucose tablets. Make sure others know that you can choke if you eat or drink when you develop serious symptoms of low blood sugar, such as seizures or unconsciousness. Get medicalhelp at once. Tell your health care provider if you have high blood sugar. You might need to change the dose of your medicine. If you are sick or exercising more thanusual, you may need to change the dose of your medicine. Do not skip meals. Ask your health care provider if you should avoid alcohol. Many nonprescription cough and cold products contain sugar or alcohol. Thesecan affect blood sugar. Wear a medical ID bracelet or chain. Carry a card that describes yourcondition. List the medicines and doses you  take on the card. What side effects may I notice from receiving this medication? Side effects that you should report to your doctor or health care professionalas soon as possible: allergic reactions (skin rash, itching or hives, swelling of the face, lips, or tongue) breathing problems dizziness feeling faint or lightheaded, falls genital infection (fever; tenderness, redness, or swelling in the genitals or area from the genitals to the back of the rectum) kidney injury (trouble passing urine or change in the amount of urine) low blood sugar (feeling anxious; confusion; dizziness; increased hunger; unusually weak or tired; increased sweating; shakiness; cold, clammy skin; irritable; headache; blurred vision; fast heartbeat; loss of consciousness) muscle weakness nausea, vomiting, unusual stomach upset or pain new pain or tenderness, change in skin color, sores or ulcers, or infection in legs or feet penile discharge, itching, or pain unusual tiredness unusual vaginal discharge, itching, or odor urinary tract infection (fever; chills; a burning feeling when urinating; urgent need to urinate more often; blood in the urine;  back pain) Side effects that usually do not require medical attention (report to yourdoctor or health care professional if they continue or are bothersome): mild increase in urination thirsty This list may not describe all possible side effects. Call your doctor for medical advice about side effects. You may report side effects to FDA at1-800-FDA-1088. Where should I keep my medication? Keep out of the reach of children and pets. Store at room temperature between 20 and 25 degrees C (68 and 77 degrees F).Get rid of any unused medicine after the expiration date. To get rid of medicines that are no longer needed or have expired: Take the medicine to a medicine take-back program. Check with your pharmacy or law enforcement to find a location. If you cannot return the medicine,  check the label or package insert to see if the medicine should be thrown out in the garbage or flushed down the toilet. If you are not sure, ask your health care provider. If it is safe to put it in the trash, take the medicine out of the container. Mix the medicine with cat litter, dirt, coffee grounds, or other unwanted substance. Seal the mixture in a bag or container. Put it in the trash. NOTE: This sheet is a summary. It may not cover all possible information. If you have questions about this medicine, talk to your doctor, pharmacist, orhealth care provider.  2022 Elsevier/Gold Standard (2020-01-07 13:18:47)  Hypoglycemia Hypoglycemia occurs when the level of sugar (glucose) in the blood is too low. Hypoglycemia can happen in people who have or do not have diabetes. It can develop quickly, and it can be a medical emergency. For most people, a blood glucose level below 70 mg/dL (3.9 mmol/L) is consideredhypoglycemia. Glucose is a type of sugar that provides the body's main source of energy. Certain hormones (insulin and glucagon) control the level of glucose in the blood. Insulin lowers blood glucose, and glucagon raises blood glucose. Hypoglycemia can result from having too much insulin in the bloodstream, or from not eating enough food that contains glucose. You may also have reactive hypoglycemia, which happens within 4 hoursafter eating a meal. What are the causes? Hypoglycemia occurs most often in people who have diabetes and may be caused by: Diabetes medicine. Not eating enough, or not eating often enough. Increased physical activity. Drinking alcohol on an empty stomach. If you do not have diabetes, hypoglycemia may be caused by: A tumor in the pancreas. Not eating enough, or not eating for long periods at a time (fasting). A severe infection or illness. Problems after having bariatric surgery. Organ failure, such as kidney or liver failure. Certain medicines. What increases the  risk? Hypoglycemia is more likely to develop in people who: Have diabetes and take medicines to lower blood glucose. Abuse alcohol. Have a severe illness. What are the signs or symptoms? Symptoms vary depending on whether the condition is mild, moderate, or severe. Mild hypoglycemia Hunger. Sweating and feeling clammy. Dizziness or feeling light-headed. Sleepiness or restless sleep. Nausea. Increased heart rate. Headache. Blurry vision. Mood changes, such as irritability or anxiety. Tingling or numbness around the mouth, lips, or tongue. Moderate hypoglycemia Confusion and poor judgment. Behavior changes. Weakness. Irregular heartbeat. A change in coordination. Severe hypoglycemia Severe hypoglycemia is a medical emergency. It can cause: Fainting. Seizures. Loss of consciousness (coma). Death. How is this diagnosed? Hypoglycemia is diagnosed with a blood test to measure your blood glucose level. This blood test is done while you are having symptoms. Your health careprovider  may also do a physical exam and review your medical history. How is this treated? This condition can be treated by immediately eating or drinking something that contains sugar with 15 grams of fast-acting carbohydrate, such as: 4 oz (120 mL) of fruit juice. 4 oz (120 mL) of regular soda (not diet soda). Several pieces of hard candy. Check food labels to find out how many pieces to eat for 15 grams. 1 Tbsp (15 mL) of sugar or honey. 4 glucose tablets. 1 tube of glucose gel. Treating hypoglycemia if you have diabetes If you are alert and able to swallow safely, follow the 15:15 rule: Take 15 grams of a fast-acting carbohydrate. Talk with your health care provider about how much you should take. Options for getting 15 grams of fast-acting carbohydrate include: Glucose tablets (take 4 tablets). Several pieces of hard candy. Check food labels to find out how many pieces to eat for 15 grams. 4 oz (120 mL) of  fruit juice. 4 oz (120 mL) of regular soda (not diet soda). 1 Tbsp (15 mL) of sugar or honey. 1 tube of glucose gel. Check your blood glucose 15 minutes after you take the carbohydrate. If the repeat blood glucose level is still at or below 70 mg/dL (3.9 mmol/L), take 15 grams of a carbohydrate again. If your blood glucose level does not increase above 70 mg/dL (3.9 mmol/L) after 3 tries, seek emergency medical care. After your blood glucose level returns to normal, eat a meal or a snack within 1 hour.  Treating severe hypoglycemia Severe hypoglycemia is when your blood glucose level is below 54 mg/dL (3 mmol/L). Severe hypoglycemia is a medical emergency. Get medical help right away. If you have severe hypoglycemia and you cannot eat or drink, you will need to be given glucagon. A family member or close friend should learn how to check your blood glucose and how to give you glucagon. Ask your health care providerif you need to have an emergency glucagon kit available. Severe hypoglycemia may need to be treated in a hospital. The treatment may include getting glucose through an IV. You may also need treatment for thecause of your hypoglycemia. Follow these instructions at home:  General instructions Take over-the-counter and prescription medicines only as told by your health care provider. Monitor your blood glucose as told by your health care provider. If you drink alcohol: Limit how much you have to: 0-1 drink a day for women who are not pregnant. 0-2 drinks a day for men. Know how much alcohol is in your drink. In the [place] one drink equals one 12 oz bottle of beer (355 mL), one 5 oz glass of wine (148 mL), or one 1 oz glass of hard liquor (44 mL). Be sure to eat food along with drinking alcohol. Be aware that alcohol is absorbed quickly and may have lingering effects that may result in hypoglycemia later. Be sure to do ongoing glucose monitoring. Keep all follow-up visits. This is  important. If you have diabetes: Always have a fast-acting carbohydrate (15 grams) option with you to treat low blood glucose. Follow your diabetes management plan as directed by your health care provider. Make sure you: Know the symptoms of hypoglycemia. It is important to treat it right away to prevent it from becoming severe. Check your blood glucose as often as told. Always check before and after exercise. Always check your blood glucose before you drive a motorized vehicle. Take your medicines as told. Follow your meal plan. Eat on  time, and do not skip meals. Share your diabetes management plan with people in your workplace, school, and household. Carry a medical alert card or wear medical alert jewelry. Where to find more information American Diabetes Association: www.diabetes.org Contact a health care provider if: You have problems keeping your blood glucose in your target range. You have frequent episodes of hypoglycemia. Get help right away if: You continue to have hypoglycemia symptoms after eating or drinking something that contains 15 grams of fast-acting carbohydrate, and you cannot get your blood glucose above 70 mg/dL (3.9 mmol/L) while following the 15:15 rule. Your blood glucose is below 54 mg/dL (3 mmol/L). You have a seizure. You faint. These symptoms may represent a serious problem that is an emergency. Do not wait to see if the symptoms will go away. Get medical help right away. Call your local emergency services (911 in the [place] Do not drive yourself to the hospital. Summary Hypoglycemia occurs when the level of sugar (glucose) in the blood is too low. Hypoglycemia can happen in people who have or do not have diabetes. It can develop quickly, and it can be a medical emergency. Make sure you know the symptoms of hypoglycemia and how to treat it. Always have a fast-acting carbohydrate option with you to treat low blood sugar. This information is not intended to  replace advice given to you by your health care provider. Make sure you discuss any questions you have with your healthcare provider. Document Revised: 07/01/2020 Document Reviewed: 07/01/2020 Elsevier Patient Education  2022 [employer].

## 2021-02-17 NOTE — Chronic Care Management (AMB) (Signed)
Chronic Care Management Pharmacy Note  02/17/2021 Name:  Vickie Kemp MRN:  [id number] DOB:  Jan 16, 1941  Subjective: Vickie Kemp is an 80 y.o. year old female who is a primary patient of McLean-Scocuzza, Nino Glow, MD.  The CCM team was consulted for assistance with disease management and care coordination needs.    Engaged with patient face to face for follow up visit in response to provider referral for pharmacy case management and/or care coordination services.   Consent to Services:  The patient was given information about Chronic Care Management services, agreed to services, and gave verbal consent prior to initiation of services.  Please see initial visit note for detailed documentation.   Patient Care Team: McLean-Scocuzza, Nino Glow, MD as PCP - General (Internal Medicine) Kate Sable, MD as PCP - Cardiology (Cardiology) De Hollingshead, RPH-CPP (Pharmacist)  Recent office visits: None recently  Recent consult visits: 6/10 - cardiology [place]- some SOB w/ minimal exertion - BP elevated at this appointment, but patient reported better control at home 5/19 - PFT - moderate restriction with diffusion defect; f/u with [facility] scheduled  5/16 - nephrology [place] - consider SGLT2? 5/5 - [facility] visits: None in previous 6 months  Objective:  Lab Results  Component Value Date   CREATININE 1.49 (H) 02/15/2021   CREATININE 1.60 (H) 06/02/2020   CREATININE 1.55 (H) 04/24/2020    Lab Results  Component Value Date   HGBA1C 7.8 (H) 02/15/2021   Last diabetic Eye exam:  Lab Results  Component Value Date/Time   HMDIABEYEEXA Retinopathy (A) 04/20/2020 12:00 AM    Last diabetic Foot exam: No results found for: HMDIABFOOTEX      Component Value Date/Time   CHOL 79 02/15/2021 0942   TRIG 50.0 02/15/2021 0942   HDL 26.70 (L) 02/15/2021 0942   CHOLHDL 3 02/15/2021 0942   VLDL 10.0 02/15/2021 0942   LDLCALC 42 02/15/2021 0942    Hepatic Function  Latest Ref Rng & Units 02/15/2021 06/16/2020 01/15/2020  Total Protein 6.0 - 8.3 g/dL 7.2 7.5 6.8  Albumin 3.5 - 5.2 g/dL 4.1 4.2 3.9  AST 0 - 37 U/L 14 14 13   ALT 0 - 35 U/L 9 9 9   Alk Phosphatase 39 - 117 U/L 97 117 86  Total Bilirubin 0.2 - 1.2 mg/dL 0.4 0.4 0.3  Bilirubin, Direct 0.0 - 0.3 mg/dL - 0.1 -    Lab Results  Component Value Date/Time   TSH 2.12 06/16/2020 09:28 AM   TSH 1.15 04/28/2019 02:08 PM    CBC Latest Ref Rng & Units 02/15/2021 06/16/2020 04/24/2020  WBC 4.0 - 10.5 K/uL 8.2 8.3 12.3(H)  Hemoglobin 12.0 - 15.0 g/dL 12.3 12.2 12.0  Hematocrit 36.0 - 46.0 % 37.7 38.0 36.9  Platelets 150.0 - 400.0 K/uL 183.0 202.0 200    Lab Results  Component Value Date/Time   VD25OH 54.23 10/15/2019 09:13 AM    Clinical ASCVD: Yes  The ASCVD Risk score Mikey Bussing DC Jr., et al., 2013) failed to calculate for the following reasons:   The valid total cholesterol range is 130 to 320 mg/dL   Unable to determine if patient is Non-Hispanic African American     Social History   Tobacco Use  Smoking Status Never  Smokeless Tobacco Never   BP Readings from Last 3 Encounters:  02/17/21 140/70  01/21/21 (!) 181/73  12/16/20 121/66   Pulse Readings from Last 3 Encounters:  02/17/21 68  01/21/21 70  12/16/20 70  Wt Readings from Last 3 Encounters:  02/17/21 200 lb 6.4 oz (90.9 kg)  01/21/21 210 lb (95.3 kg)  12/16/20 199 lb 12.8 oz (90.6 kg)    Assessment: Review of patient past medical history, allergies, medications, health status, including review of consultants reports, laboratory and other test data, was performed as part of comprehensive evaluation and provision of chronic care management services.   SDOH:  (Social Determinants of Health) assessments and interventions performed:    CCM Care Plan  Allergies  Allergen Reactions   Amoxicillin Itching    Medications Reviewed Today     Reviewed by Thressa Sheller, CMA (Certified Medical Assistant) on 02/17/21 at 1029   Med List Status: <None>   Medication Order Taking? Sig Documenting Provider Last Dose Status Informant  acetaminophen (TYLENOL) 650 MG CR tablet [id number] Yes Take 650 mg by mouth 3 (three) times daily as needed. [provider] Taking Active   amLODipine (NORVASC) 5 MG tablet [id number] Yes Take 1 tablet (5 mg total) by mouth daily. Kate Sable, MD Taking Active   aspirin EC 81 MG tablet [id number] Yes Take 81 mg by mouth daily. [provider] Taking Active   atorvastatin (LIPITOR) 40 MG tablet [id number] Yes Take 1 tablet (40 mg total) by mouth daily. Please schedule office visit for further refills. Thank you! Kate Sable, MD Taking Active   carvedilol (COREG) 25 MG tablet [id number] Yes Take 1 tablet (25 mg total) by mouth 2 (two) times daily. Kate Sable, MD Taking Active   Cholecalciferol (VITAMIN D3 PO) [id number] Yes Take by mouth. [provider] Taking Active   dicyclomine (BENTYL) 10 MG capsule [id number] No Take 1 capsule (10 mg total) by mouth 4 (four) times daily -  before meals and at bedtime.  Patient not taking: Reported on 02/17/2021   [name], MD Not Taking Consider Medication Status and Discontinue   furosemide (LASIX) 40 MG tablet [id number] Yes Take 1 tablet (40 mg total) by mouth in the morning and at bedtime. Kate Sable, MD Taking Active   Glucose Blood (BLOOD GLUCOSE TEST STRIPS) STRP [id number] Yes E11.9 check sugar tid for designated machine McLean-Scocuzza, Nino Glow, MD Taking Active   insulin degludec (TRESIBA FLEXTOUCH) 200 UNIT/ML FlexTouch Pen [id number] Yes Inject 40 Units into the skin daily. McLean-Scocuzza, Nino Glow, MD Taking Active   insulin lispro (HUMALOG KWIKPEN) 100 UNIT/ML KwikPen [id number] Yes Prn based on sugar If sugar 70-130 0 units If sugar 131-180 4 units If sugar 181-240 8 units If sugar 241-300 10 units If sugar 301-350 12 units If sugar 351-400 16 units If >400 20 units and call doctor  McLean-Scocuzza, Nino Glow, MD Taking Active   Insulin Pen Needle (PEN NEEDLES) 31G X 8 MM MISC [id number] Yes 1 pen by Does not apply route 2 (two) times daily as needed. McLean-Scocuzza, Nino Glow, MD Taking Active   Insulin Pen Needle 32G X 4 MM MISC [id number] Yes 1 Device by Does not apply route 4 (four) times daily - after meals and at bedtime. McLean-Scocuzza, Nino Glow, MD Taking Active   latanoprost (XALATAN) 0.005 % ophthalmic solution [id number] Yes Place 1 drop into the left eye at bedtime. [provider] Taking Active   losartan (COZAAR) 100 MG tablet [id number] Yes Take 100 mg by mouth daily. [provider] Taking Active   Turmeric (QC TUMERIC COMPLEX PO) [id number] Yes Take by mouth.  [provider] Taking Active  Patient Active Problem List   Diagnosis Date Noted   Shortness of breath 09/14/2020   Chronic venous insufficiency 08/02/2020   Chronic diarrhea of unknown origin    Lymphedema 05/18/2020   Benign hypertensive kidney disease with chronic kidney disease 05/03/2020   Chronic kidney disease, stage IV (severe) ([place]) 05/03/2020   Diabetes mellitus ([place]) 05/03/2020   OSA on CPAP 02/29/2020   Hypertension associated with diabetes ([place]) 01/20/2020   Memory loss 01/20/2020   Tremor 01/20/2020   Essential hypertension 01/20/2020   Left lumbar radiculopathy 12/26/2019   At risk for obstructive sleep apnea 11/21/2019   Allergic rhinitis 11/21/2019   Cystitis 10/17/2019   PVD (peripheral vascular disease) ([place]) 10/02/2019   Stage 3b chronic kidney disease ([employer]) 09/30/2019   Lumbar radiculopathy 09/30/2019   Cervicalgia 09/30/2019   CKD (chronic kidney disease) stage 4, GFR 15-29 ml/min (HCC) 09/30/2019   Arthritis 09/30/2019   Chronic maxillary sinusitis 09/30/2019   Venous stasis 08/28/2019   Secondary hyperparathyroidism of renal origin ([place]) 07/29/2019   Type 2 diabetes mellitus with diabetic chronic kidney disease ([place]) 06/10/2019    Pain due to onychomycosis of toenails of both feet 05/01/2019   Type 2 diabetes mellitus with vascular disease ([place]) 05/01/2019   Chronic arthropathy 05/01/2019   Obesity 01/21/2018   CAD (coronary artery disease) 06/04/2017   Hyperlipemia 06/04/2017   Insulin dependent diabetes mellitus 06/04/2017   S/P PTCA (percutaneous transluminal coronary angioplasty) 06/04/2017   Palpitations 06/04/2017   Essential (primary) hypertension 06/04/2015    Immunization History  Administered Date(s) Administered   Fluad Quad(high Dose 65+) 04/28/2019, 05/14/2020   Influenza, High Dose Seasonal PF 06/04/2017, 06/07/2018   PFIZER(Purple Top)SARS-COV-2 Vaccination 10/08/2019, 10/29/2019, 05/14/2020   Pneumococcal Polysaccharide-23 05/05/2013    Conditions to be addressed/monitored: HTN, HLD, and DMII  Care Plan : Medication Management  Updates made by De Hollingshead, RPH-CPP since 02/17/2021 12:00 AM     Problem: Diabetes, CAD, PVD, HTN      Long-Range Goal: Disease Progression Prevention   Start Date: 06/18/2020  Expected End Date: 09/18/2020  This Visit's Progress: On track  Recent Progress: On track  Priority: High  Note:   Current Barriers:  Unable to achieve control of diabetes   Pharmacist Clinical Goal(s):  Over the next 90 days, patient will improve control of diabetes through collaboration with PharmD and provider.   Interventions: 1:1 collaboration with McLean-Scocuzza, Nino Glow, MD regarding development and update of comprehensive plan of care as evidenced by provider attestation and co-signature Inter-disciplinary care team collaboration (see longitudinal plan of care) Comprehensive medication review performed; medication list updated in electronic medical record  Health Maintenance: Up to date on DM eye exam, foot exam Due for Shingrix. Will discuss moving forward.   Diabetes: Uncontrolled but improved; current treatment: Tresiba 40 units daily, Humalog 5 units with  lunch and supper - though has not been taking recently   Renal function limits use of metformin Current glucose readings: Date of Download: 6/24-02/17/21 % Time CGM is active: 94% Average Glucose: 137 mg/dL Glucose Management Indicator: 6.6%  Glucose Variability: 32.2 (goal <36%) Time in Goal:  - Time in range 70-180: 82% - Time above range: 18% - Time below range: 0% Observed patterns: occasional episodes of hypoglycemia, occasional evening hyperglycemia Current meal patterns: snacking to treat hypoglycemia Discussed with PCP. Recommend reducing Tresiba to 36 units daily.  Educated on SGLT2 benefit in CKD, DM. Counseled on SGLT2, including mechanism of action, side effects, and benefits. Discussed potential side  effects of dehydration, genitourinary infections. Encouraged adequate hydration and genital hygiene. Advised on sick day rules (if a day with significantly reduced oral intake, serious vomiting, or diarrhea, hold SGLT2). Patient verbalized understanding. eGFR is still appropriate for initiation of Farxiga. Patient declines addition of medication today, would prefer to think about it.   Hypertension in the setting of CKD: Moderately well controlled BP today in office; current treatment: amlodipine 5 mg daily QPM, losartan 100 mg QAM, carvedilol 25 mg BID, furosemide 40 mg BID Recommend to continue current regimen at this time.  Discussed SGLT2 as above. Follow for interest moving forward.   Hyperlipidemia, secondary ASCVD prevention (CAD, MI 2014, PVD) Controlled per last lipid panel; current treatment: atorvastatin 40 mg daily Antiplatelet regimen: aspirin 81 mg daily Recommended to continue current regimen at this time  Patient Goals/Self-Care Activities Over the next 90 days, patient will:  - take medications as prescribed check glucose three times daily using CGM, document, and provide at future appointments  Follow Up Plan: Telephone follow up appointment with care  management team member scheduled for: offered follow up in 6-8 weeks. Patient declines. Will schedule outreach in ~ 3 months     Medication Assistance: None required.  Patient affirms current coverage meets needs.  Patient's preferred pharmacy is:  [facility] [place], [place] - [place] AT [address] [place] [address] Phone: [phone] Fax: [phone]    Follow Up:  Patient agrees to Care Plan and Follow-up.  Plan: Telephone follow up appointment with care management team member scheduled for: offered follow up in 6-8 weeks. Patient declines. Will schedule outreach in ~ 3-4 months  [name], PharmD, [place], [place] Clinical Pharmacist [employer] at [place]

## 2021-02-17 NOTE — Progress Notes (Addendum)
Chief Complaint  Patient presents with   Follow-up   F/u  1. Dm 2 and htn with CKD BP controlled on norvasc 5mg  qd lasix 4 0mg  qd tresiba 40 units but having lows on losartan 100 mg qd  Having lows to 64-69 at time will reduced to 36 units shes not taking novolog  2. Chronic pain low back and left leg tylenol helps at times it is 8/10 abnormal imaging will try tramadol in the future if needed    Review of Systems  Constitutional:  Negative for weight loss.  HENT:  Negative for hearing loss.   Eyes:  Negative for blurred vision.  Respiratory:  Negative for shortness of breath.   Cardiovascular:  Negative for chest pain.  Gastrointestinal:  Negative for abdominal pain.  Musculoskeletal:  Positive for back pain, joint pain and neck pain.  Skin:  Negative for rash.  Neurological:  Negative for headaches.  Psychiatric/Behavioral:  Negative for depression.   Past Medical History:  Diagnosis Date   Chronic kidney disease    Coronary artery disease    MI, pci to mid LAD 2014. LHC 80%OM, 60%RCA, 90% LAD   Diabetes mellitus without complication (HCC)    Hypertension    Sleep apnea    Past Surgical History:  Procedure Laterality Date   ACNE CYST REMOVAL  2008   Groin area   CARDIAC CATHETERIZATION     COLONOSCOPY WITH PROPOFOL N/A 05/27/2020   Procedure: COLONOSCOPY WITH PROPOFOL;  Surgeon: [name], MD;  Location: [facility] ENDOSCOPY;  Service: Gastroenterology;  Laterality: N/A;   CORONARY ANGIOPLASTY     Family History  Problem Relation Age of Onset   Diabetes Mother    Heart disease Father    Breast cancer Neg Hx    Social History   Socioeconomic History   Marital status: Single    Spouse name: Not on file   Number of children: Not on file   Years of education: Not on file   Highest education level: Not on file  Occupational History   Not on file  Tobacco Use   Smoking status: Never   Smokeless tobacco: Never  Vaping Use   Vaping Use: Never used  Substance  and Sexual Activity   Alcohol use: Never   Drug use: Never   Sexual activity: Not on file  Other Topics Concern   Not on file  Social History Narrative   Used to live in [place]    Social Determinants of Health   Financial Resource Strain: Low Risk    Difficulty of Paying Living Expenses: Not hard at all  Food Insecurity: No Food Insecurity   Worried About Charity fundraiser in the Last Year: Never true   Arboriculturist in the Last Year: Never true  Transportation Needs: No Transportation Needs   Lack of Transportation (Medical): No   Lack of Transportation (Non-Medical): No  Physical Activity: Not on file  Stress: No Stress Concern Present   Feeling of Stress : Not at all  Social Connections: Unknown   Frequency of Communication with Friends and Family: More than three times a week   Frequency of Social Gatherings with Friends and Family: More than three times a week   Attends Religious Services: More than 4 times per year   Active Member of [employer] or Organizations: Yes   Attends Archivist Meetings: Not on file   Marital Status: Not on file  Intimate  Partner Violence: Not At Risk   Fear of Current or Ex-Partner: No   Emotionally Abused: No   Physically Abused: No   Sexually Abused: No   Current Meds  Medication Sig   acetaminophen (TYLENOL) 650 MG CR tablet Take 650 mg by mouth 3 (three) times daily as needed.   amLODipine (NORVASC) 5 MG tablet Take 1 tablet (5 mg total) by mouth daily.   aspirin EC 81 MG tablet Take 81 mg by mouth daily.   carvedilol (COREG) 25 MG tablet Take 1 tablet (25 mg total) by mouth 2 (two) times daily.   Cholecalciferol (VITAMIN D3 PO) Take by mouth.   furosemide (LASIX) 40 MG tablet Take 1 tablet (40 mg total) by mouth in the morning and at bedtime.   Glucose Blood (BLOOD GLUCOSE TEST STRIPS) STRP E11.9 check sugar tid for designated machine   insulin lispro (HUMALOG KWIKPEN) 100 UNIT/ML KwikPen Prn based on sugar If  sugar 70-130 0 units If sugar 131-180 4 units If sugar 181-240 8 units If sugar 241-300 10 units If sugar 301-350 12 units If sugar 351-400 16 units If >400 20 units and call doctor   Insulin Pen Needle (PEN NEEDLES) 31G X 8 MM MISC 1 pen by Does not apply route 2 (two) times daily as needed.   Insulin Pen Needle 32G X 4 MM MISC 1 Device by Does not apply route 4 (four) times daily - after meals and at bedtime.   latanoprost (XALATAN) 0.005 % ophthalmic solution Place 1 drop into the left eye at bedtime.   traMADol (ULTRAM) 50 MG tablet Take 1 tablet (50 mg total) by mouth 2 (two) times daily as needed.   Turmeric (QC TUMERIC COMPLEX PO) Take by mouth.    [DISCONTINUED] atorvastatin (LIPITOR) 40 MG tablet Take 1 tablet (40 mg total) by mouth daily. Please schedule office visit for further refills. Thank you!   [DISCONTINUED] insulin degludec (TRESIBA FLEXTOUCH) 200 UNIT/ML FlexTouch Pen Inject 40 Units into the skin daily.   [DISCONTINUED] losartan (COZAAR) 100 MG tablet Take 100 mg by mouth daily.   Allergies  Allergen Reactions   Amoxicillin Itching   Recent Results (from the past 2160 hour(s))  SARS CORONAVIRUS 2 (TAT 6-24 HRS) Nasopharyngeal Nasopharyngeal Swab     Status: None   Collection Time: 12/29/20  9:16 AM   Specimen: Nasopharyngeal Swab  Result Value Ref Range   SARS Coronavirus 2 NEGATIVE NEGATIVE    Comment: (NOTE) SARS-CoV-2 target nucleic acids are NOT DETECTED.  The SARS-CoV-2 RNA is generally detectable in upper and lower respiratory specimens during the acute phase of infection. Negative results do not preclude SARS-CoV-2 infection, do not rule out co-infections with other pathogens, and should not be used as the sole basis for treatment or other patient management decisions. Negative results must be combined with clinical observations, patient history, and epidemiological information. The expected result is Negative.  Fact Sheet for  Patients: SugarRoll.be  Fact Sheet for Healthcare Providers: https://www.woods-mathews.com/  This test is not yet approved or cleared by the [place] FDA and  has been authorized for detection and/or diagnosis of SARS-CoV-2 by FDA under an Emergency Use Authorization (EUA). This EUA will remain  in effect (meaning this test can be used) for the duration of the COVID-19 declaration under Se ction 564(b)(1) of the Act, 21 U.S.C. section 360bbb-3(b)(1), unless the authorization is terminated or revoked sooner.  Performed at [facility], [place] [address], [place], [place] [address]  Hemoglobin A1c     Status: Abnormal   Collection Time: 02/15/21  9:42 AM  Result Value Ref Range   Hgb A1c MFr Bld 7.8 (H) 4.6 - 6.5 %    Comment: Glycemic Control Guidelines for People with Diabetes:Non Diabetic:  <6%Goal of Therapy: <7%Additional Action Suggested:  >8%   CBC w/Diff     Status: None   Collection Time: 02/15/21  9:42 AM  Result Value Ref Range   WBC 8.2 4.0 - 10.5 K/uL   RBC 4.35 3.87 - 5.11 Mil/uL   Hemoglobin 12.3 12.0 - 15.0 g/dL   HCT 37.7 36.0 - 46.0 %   MCV 86.7 78.0 - 100.0 fl   MCHC 32.7 30.0 - 36.0 g/dL   RDW 14.8 11.5 - 15.5 %   Platelets 183.0 150.0 - 400.0 K/uL   Neutrophils Relative % 61.2 43.0 - 77.0 %   Lymphocytes Relative 26.8 12.0 - 46.0 %   Monocytes Relative 8.7 3.0 - 12.0 %   Eosinophils Relative 2.6 0.0 - 5.0 %   Basophils Relative 0.7 0.0 - 3.0 %   Neutro Abs 5.0 1.4 - 7.7 K/uL   Lymphs Abs 2.2 0.7 - 4.0 K/uL   Monocytes Absolute 0.7 0.1 - 1.0 K/uL   Eosinophils Absolute 0.2 0.0 - 0.7 K/uL   Basophils Absolute 0.1 0.0 - 0.1 K/uL  Lipid panel     Status: Abnormal   Collection Time: 02/15/21  9:42 AM  Result Value Ref Range   Cholesterol 79 0 - 200 mg/dL    Comment: ATP III Classification       Desirable:  < 200 mg/dL               Borderline High:  200 - 239 mg/dL          High:  > = 240 mg/dL    Triglycerides 50.0 0.0 - 149.0 mg/dL    Comment: Normal:  <150 mg/dLBorderline High:  150 - 199 mg/dL   HDL 26.70 (L) >39.00 mg/dL   VLDL 10.0 0.0 - 40.0 mg/dL   LDL Cholesterol 42 0 - 99 mg/dL   Total CHOL/HDL Ratio 3     Comment:                Men          Women1/2 Average Risk     3.4          3.3Average Risk          5.0          4.42X Average Risk          9.6          7.13X Average Risk          15.0          11.0                       NonHDL 52.42     Comment: NOTE:  Non-HDL goal should be 30 mg/dL higher than patient's LDL goal (i.e. LDL goal of < 70 mg/dL, would have non-HDL goal of < 100 mg/dL)  Comprehensive metabolic panel     Status: Abnormal   Collection Time: 02/15/21  9:42 AM  Result Value Ref Range   Sodium 142 135 - 145 mEq/L   Potassium 3.9 3.5 - 5.1 mEq/L   Chloride 105 96 - 112 mEq/L   CO2 31 19 - 32 mEq/L   Glucose, Bld 84 70 -  99 mg/dL   BUN 18 6 - 23 mg/dL   Creatinine, Ser 1.49 (H) 0.40 - 1.20 mg/dL   Total Bilirubin 0.4 0.2 - 1.2 mg/dL   Alkaline Phosphatase 97 39 - 117 U/L   AST 14 0 - 37 U/L   ALT 9 0 - 35 U/L   Total Protein 7.2 6.0 - 8.3 g/dL   Albumin 4.1 3.5 - 5.2 g/dL   GFR 33.27 (L) >60.00 mL/min    Comment: Calculated using the CKD-EPI Creatinine Equation (2021)   Calcium 9.3 8.4 - 10.5 mg/dL   Objective  Body mass index is 33.35 kg/m. Wt Readings from Last 3 Encounters:  02/17/21 200 lb 6.4 oz (90.9 kg)  01/21/21 210 lb (95.3 kg)  12/16/20 199 lb 12.8 oz (90.6 kg)   Temp Readings from Last 3 Encounters:  02/17/21 97.8 F (36.6 C) (Oral)  09/22/20 98.5 F (36.9 C) (Oral)  09/13/20 97.8 F (36.6 C) (Temporal)   BP Readings from Last 3 Encounters:  02/17/21 140/70  01/21/21 (!) 181/73  12/16/20 121/66   Pulse Readings from Last 3 Encounters:  02/17/21 68  01/21/21 70  12/16/20 70    Physical Exam Vitals and nursing note reviewed.  Constitutional:      Appearance: Normal appearance. She is well-developed and well-groomed. She  is obese.  HENT:     Head: Normocephalic and atraumatic.  Eyes:     Conjunctiva/sclera: Conjunctivae normal.     Pupils: Pupils are equal, round, and reactive to light.  Cardiovascular:     Rate and Rhythm: Normal rate and regular rhythm.     Heart sounds: Normal heart sounds.  Pulmonary:     Effort: Pulmonary effort is normal.     Breath sounds: Normal breath sounds.  Abdominal:     General: Abdomen is flat. Bowel sounds are normal.  Skin:    General: Skin is warm and dry.  Neurological:     General: No focal deficit present.     Mental Status: She is alert and oriented to person, place, and time. Mental status is at baseline.     Gait: Gait normal.  Psychiatric:        Attention and Perception: Attention and perception normal.        Mood and Affect: Mood and affect normal.        Speech: Speech normal.        Behavior: Behavior normal. Behavior is cooperative.        Thought Content: Thought content normal.        Cognition and Memory: Cognition and memory normal.        Judgment: Judgment normal.    Assessment  Plan  Hypertension controlled with stable CKD associated with diabetes (HCC) A1C 9.3 to 7.8 improved - Plan: losartan (COZAAR) 100 MG tablet norvasc 5mg  qd lasix 4 0mg  qd tresiba 40 units but having lows on losartan 100 mg qd  Having lows to 64-69 at time will reduced to 36 units shes not taking novolog  atorvastatin (LIPITOR) 40 MG tablet Pt declines farxiga with CKD for now will review side effects   Hyperlipidemia, unspecified hyperlipidemia type - Plan: atorvastatin (LIPITOR) 40 MG tablet  Lumbar radiculopathy - Plan: traMADol (ULTRAM) 50 MG tablet bid prn  Cervicalgia - Plan: traMADol (ULTRAM) 50 MG tablet  Disc otc lidocaine pain patch   HM Flu shot utd pna 23 utd  Consider Prevnar, tdap, shingrix vaccine in the future given Rx today covid 19 vx utd  3/3 consider booster  [facility]   Hep C neg 06/10/19 Carotid artery  plaque b/l DEXA 11/20/16 osteopenia Out of pap window Colonoscopy sch 05/27/20 repeat due in 5 years Dr. Marius Ditch Mammogram 10/26/16 negative, 11/18/20 negative    Never smoker    07/08/16 lumbar mild L4/5 mild disc narrowing Grade I anterolisthesis L4 and L5 with associated disc space narrowing without evidence of fracture. Arthritis 08/12/14 degenerative changes CT scan 07/24/16 negative ABI right 0.64, left 0.62 moderate 05/30/17 moderate PVD 05/25/17 left maxillary sinus disease    Specialists [facility]     Provider: Dr. Olivia Mackie [facility]

## 2021-02-17 NOTE — Addendum Note (Signed)
Addended by: Leeanne Rio on: 02/17/2021 12:17 PM   Modules accepted: Orders

## 2021-02-17 NOTE — Patient Instructions (Addendum)
Vickie Kemp,   It was great talking with you today! Please call me if you need anything in the meantime.   Since you didn't schedule follow up with Dr. Olivia Mackie at check out, I went ahead and scheduled an outreach call in about 4 months.   Take care!  [name], PharmD [phone]   Visit Information  PATIENT GOALS:  Goals Addressed               This Visit's Progress     Patient Stated     Medication Monitoring (pt-stated)        Patient Goals/Self-Care Activities Over the next 90 days, patient will:  - take medications as prescribed check glucose at least three times daily using CGM, document, and provide at future appointments          Patient verbalizes understanding of instructions provided today and agrees to view in [place].    Plan: Telephone follow up appointment with care management team member scheduled for: offered follow up in 6-8 weeks. Patient declines. Will plan to see her when she next sees PCP in office.  [name], PharmD, [place], [place] Clinical Pharmacist [employer] [facility] [place]

## 2021-02-18 NOTE — Telephone Encounter (Signed)
For your information  

## 2021-02-19 LAB — URINALYSIS, ROUTINE W REFLEX MICROSCOPIC
Bilirubin Urine: NEGATIVE
Glucose, UA: NEGATIVE
Hgb urine dipstick: NEGATIVE
Ketones, ur: NEGATIVE
Nitrite: NEGATIVE
Protein, ur: NEGATIVE
RBC / HPF: NONE SEEN /HPF (ref 0–2)
Specific Gravity, Urine: 1.008 (ref 1.001–1.035)
Squamous Epithelial / HPF: NONE SEEN /HPF (ref <=5)
pH: 5.5 (ref 5.0–8.0)

## 2021-02-19 LAB — URINE CULTURE
MICRO NUMBER:: 12092035
SPECIMEN QUALITY:: ADEQUATE

## 2021-02-19 LAB — MICROSCOPIC MESSAGE

## 2021-02-23 ENCOUNTER — Ambulatory Visit: Payer: Medicare Other | Admitting: Internal Medicine

## 2021-02-23 ENCOUNTER — Encounter: Payer: Self-pay | Admitting: Internal Medicine

## 2021-02-23 ENCOUNTER — Other Ambulatory Visit: Payer: Self-pay

## 2021-02-23 VITALS — BP 138/88 | HR 71 | Temp 98.7°F | Ht 65.0 in | Wt 199.6 lb

## 2021-02-23 DIAGNOSIS — G4733 Obstructive sleep apnea (adult) (pediatric): Secondary | ICD-10-CM

## 2021-02-23 DIAGNOSIS — I5022 Chronic systolic (congestive) heart failure: Secondary | ICD-10-CM | POA: Diagnosis not present

## 2021-02-23 DIAGNOSIS — R0689 Other abnormalities of breathing: Secondary | ICD-10-CM | POA: Diagnosis not present

## 2021-02-23 DIAGNOSIS — I2781 Cor pulmonale (chronic): Secondary | ICD-10-CM | POA: Diagnosis not present

## 2021-02-23 NOTE — Progress Notes (Signed)
@Patient  ID: Vickie Kemp, female    DOB: Feb 02, 1941, 80 y.o.   MRN: [id number]   SYNOPSIS Patient presents today for 6 month follow-up sleep apnea. She has not been tolerating CPAP well. She reports a dry cough since she started wearing CPAP. She continues to use CPAP intermittently, she will stop using it for several nights d/t cough. She also experiences dyspnea on exertion with associated wheezing. She thought her breathing would improve significantly once she started using CPAP machine. She has hx heart failure and pulmonary hypertension. She takes lasix morning and at bedtime. She often wakes up several times at night to use the restroom.    Airview download 08/09/20-09/07/20: 10/30 (33%) days used; 20% > 4 hours Average usage 5 hours 8 mins   Pressure 5-15cm h20 (9.8cm h20-95%) Airleaks 7.7L/min (95%) AHI 2.8    CC Follow up OSA   HPI: 80 year old female, never smoked. PMH significant for CAD, HTN, type 2 diabetes, severe OSA, chronic maxillary sinusitis, chronic kidney disease.  Patient has severe sleep apnea with a AHI of 40 Patient was surprised she has not heard these numbers before I went over in detail her sleep study results from 1 year ago  Patient has progressive intermittent shortness of breath Patient was prescribed albuterol but she states that she was not told how to use it Will provide education while in office  No exacerbation at this time No evidence of heart failure at this time No evidence or signs of infection at this time No respiratory distress No fevers, chills, nausea, vomiting, diarrhea No evidence of lower extremity edema No evidence hemoptysis  Patient has signs symptoms of heart failure with cor pulmonale sees cardiology Patient is currently on Lasix therapy  Allergies  Allergen Reactions   Amoxicillin Itching    Immunization History  Administered Date(s) Administered   Fluad Quad(high Dose 65+) 04/28/2019, 05/14/2020   Influenza,  High Dose Seasonal PF 06/04/2017, 06/07/2018   PFIZER(Purple Top)SARS-COV-2 Vaccination 10/08/2019, 10/29/2019, 05/14/2020   Pneumococcal Polysaccharide-23 05/05/2013    Past Medical History:  Diagnosis Date   Chronic kidney disease    Coronary artery disease    MI, pci to mid LAD 2014. LHC 80%OM, 60%RCA, 90% LAD   Diabetes mellitus without complication (HCC)    Hypertension    Sleep apnea     Tobacco History: Social History   Tobacco Use  Smoking Status Never  Smokeless Tobacco Never   Counseling given: Not Answered   Outpatient Medications Prior to Visit  Medication Sig Dispense Refill   acetaminophen (TYLENOL) 650 MG CR tablet Take 650 mg by mouth 3 (three) times daily as needed.     amLODipine (NORVASC) 5 MG tablet Take 1 tablet (5 mg total) by mouth daily. 90 tablet 2   aspirin EC 81 MG tablet Take 81 mg by mouth daily.     atorvastatin (LIPITOR) 40 MG tablet Take 1 tablet (40 mg total) by mouth daily. Qhs 90 tablet 3   Cholecalciferol (VITAMIN D3 PO) Take by mouth.     furosemide (LASIX) 40 MG tablet Take 1 tablet (40 mg total) by mouth in the morning and at bedtime. 60 tablet 6   Glucose Blood (BLOOD GLUCOSE TEST STRIPS) STRP E11.9 check sugar tid for designated machine 300 strip 5   insulin degludec (TRESIBA FLEXTOUCH) 200 UNIT/ML FlexTouch Pen Inject 36 Units into the skin daily. 30 mL 3   insulin lispro (HUMALOG KWIKPEN) 100 UNIT/ML KwikPen Prn based on sugar If sugar  70-130 0 units If sugar 131-180 4 units If sugar 181-240 8 units If sugar 241-300 10 units If sugar 301-350 12 units If sugar 351-400 16 units If >400 20 units and call doctor 15 mL 11   Insulin Pen Needle (PEN NEEDLES) 31G X 8 MM MISC 1 pen by Does not apply route 2 (two) times daily as needed. 100 each 3   Insulin Pen Needle 32G X 4 MM MISC 1 Device by Does not apply route 4 (four) times daily - after meals and at bedtime. 360 each 3   latanoprost (XALATAN) 0.005 % ophthalmic solution Place 1 drop into  the left eye at bedtime.     losartan (COZAAR) 100 MG tablet Take 1 tablet (100 mg total) by mouth daily. 90 tablet 3   traMADol (ULTRAM) 50 MG tablet Take 1 tablet (50 mg total) by mouth 2 (two) times daily as needed. 10 tablet 0   Turmeric (QC TUMERIC COMPLEX PO) Take by mouth.      carvedilol (COREG) 25 MG tablet Take 1 tablet (25 mg total) by mouth 2 (two) times daily. 180 tablet 3   No facility-administered medications prior to visit.    Review of Systems:  Gen:  Denies  fever, sweats, chills weight loss  HEENT: Denies blurred vision, double vision, ear pain, eye pain, hearing loss, nose bleeds, sore throat Cardiac:  No dizziness, chest pain or heaviness, chest tightness,edema, No JVD Resp:   No cough, -sputum production, +shortness of breath,+wheezing, -hemoptysis,  Other:  All other systems negative   BP 138/88 (BP Location: Left Arm, Patient Position: Sitting, Cuff Size: Normal)   Pulse 71   Temp 98.7 F (37.1 C) (Oral)   Ht 5\' 5"  (1.651 m)   Wt 199 lb 9.6 oz (90.5 kg)   LMP  (LMP Unknown)   SpO2 97%   BMI 33.22 kg/m    Physical Examination:   General Appearance: No distress  EYES PERRLA, EOM intact.   NECK Supple, No JVD Pulmonary: normal breath sounds, No wheezing.  CardiovascularNormal S1,S2.  No m/r/g.   Abdomen: Benign, Soft, non-tender. Skin:   warm, no rashes, no ecchymosis  Extremities: normal, no cyanosis, clubbing. Neuro:without focal findings,  speech normal  PSYCHIATRIC: Mood, affect within normal limits.   ALL OTHER ROS ARE NEGATIVE  Lab Results:  CBC    Component Value Date/Time   WBC 8.2 02/15/2021 0942   RBC 4.35 02/15/2021 0942   HGB 12.3 02/15/2021 0942   HCT 37.7 02/15/2021 0942   PLT 183.0 02/15/2021 0942   MCV 86.7 02/15/2021 0942   MCH 28.6 04/24/2020 1119   MCHC 32.7 02/15/2021 0942   RDW 14.8 02/15/2021 0942   LYMPHSABS 2.2 02/15/2021 0942   MONOABS 0.7 02/15/2021 0942   EOSABS 0.2 02/15/2021 0942   BASOSABS 0.1 02/15/2021  0942    BMET    Component Value Date/Time   NA 142 02/15/2021 0942   K 3.9 02/15/2021 0942   CL 105 02/15/2021 0942   CO2 31 02/15/2021 0942   GLUCOSE 84 02/15/2021 0942   BUN 18 02/15/2021 0942   CREATININE 1.49 (H) 02/15/2021 0942   CALCIUM 9.3 02/15/2021 0942   GFRNONAA 31 (L) 06/02/2020 1103   GFRAA 37 (L) 04/24/2020 1119    BNP    Component Value Date/Time   BNP 79.0 04/24/2020 1119     Assessment & Plan:   80 year old pleasant African-American female with a history of cor pulmonale diastolic heart failure with severe sleep  apnea based on sleep study completed last year with a  AHI of 40 , patient has been noncompliant with her auto CPAP therapy  I have discussed her sleep study results from 1 year ago She was very surprised of her results I have encouraged her to start using her auto CPAP therapy as soon as possible I have recommended a mask fitting referral service to assess her mask She refused to get this referral at this time  Patient with intermittent shortness of breath and dyspnea on exertion This is multifactorial based on her weight as well as her diagnosis of heart failure and pulm hypertension Findings consistent with cor pulmonale Continue Lasix as prescribed Follow-up with cardiology as prescribed  Patient was prescribed albuterol as needed She will need pulmonary function testing at some point Patient will need assessment for exertional hypoxia but pressing issue is to start CPAP therapy  The most pressing issue is that she started auto CPAP therapy as soon as possible I have explained to the patient that delaying CPAP therapy for sleep apnea can lead to stroke, acute heart failure, uncontrolled hypertension, uncontrolled diabetes, possible fatal arrhythmias.  Patient understands the risks of not starting CPAP therapy   MEDICATION ADJUSTMENTS/LABS AND TESTS ORDERED: Patient will need teaching regarding albuterol use and spacer use  Patient will  need auto CPAP therapy as previously prescribed however will wait next week to start therapy  Continue to use albuterol as needed   Follow-up in 3 months with compliance report  Follow-up with cardiology as scheduled   Patient  satisfied with Plan of action and management. All questions answered  Follow up 3 months with compliance report  Total Time Spent  35 mins   Maretta Bees Patricia Pesa, M.D.  [facility]  Medical Director [facility] Cardio-Pulmonary Department

## 2021-02-23 NOTE — Patient Instructions (Addendum)
Patient will need teaching regarding albuterol use and spacer use  Patient will need auto CPAP therapy as previously prescribed however will wait next week to start therapy AUTOCPAP 5-15 cm h20  Continue to use albuterol as needed   Follow-up in 3 months with compliance report  Follow-up with cardiology as scheduled

## 2021-03-01 DIAGNOSIS — G4733 Obstructive sleep apnea (adult) (pediatric): Secondary | ICD-10-CM | POA: Diagnosis not present

## 2021-03-07 DIAGNOSIS — E1165 Type 2 diabetes mellitus with hyperglycemia: Secondary | ICD-10-CM | POA: Diagnosis not present

## 2021-03-11 DIAGNOSIS — G4733 Obstructive sleep apnea (adult) (pediatric): Secondary | ICD-10-CM | POA: Diagnosis not present

## 2021-03-18 ENCOUNTER — Ambulatory Visit: Payer: Medicare Other | Attending: Internal Medicine

## 2021-03-18 ENCOUNTER — Other Ambulatory Visit: Payer: Self-pay

## 2021-03-18 DIAGNOSIS — Z23 Encounter for immunization: Secondary | ICD-10-CM

## 2021-03-18 MED ORDER — PFIZER-BIONT COVID-19 VAC-TRIS 30 MCG/0.3ML IM SUSP
INTRAMUSCULAR | 0 refills | Status: DC
Start: 2021-03-18 — End: 2021-06-07
  Filled 2021-03-18: qty 0.3, 1d supply, fill #0

## 2021-03-18 NOTE — Progress Notes (Signed)
   Covid-19 Vaccination Clinic  Name:  Vickie Kemp    MRN: [id number] DOB: [birth year]  03/18/2021  Vickie Kemp was observed post Covid-19 immunization for 15 minutes without incident. She was provided with Vaccine Information Sheet and instruction to access the V-Safe system.   Vickie Kemp was instructed to call 911 with any severe reactions post vaccine: Difficulty breathing  Swelling of face and throat  A fast heartbeat  A bad rash all over body  Dizziness and weakness   Immunizations Administered     Name Date Dose VIS Date Route   PFIZER Comrnaty(Gray TOP) Covid-19 Vaccine 03/18/2021 11:06 AM 0.3 mL 07/22/2020 Intramuscular   Manufacturer: [place]   Lot: [id number]   [place]: [place], PharmD, MBA Clinical Acute Care Pharmacist

## 2021-04-07 DIAGNOSIS — E1165 Type 2 diabetes mellitus with hyperglycemia: Secondary | ICD-10-CM | POA: Diagnosis not present

## 2021-04-11 DIAGNOSIS — G4733 Obstructive sleep apnea (adult) (pediatric): Secondary | ICD-10-CM | POA: Diagnosis not present

## 2021-04-25 DIAGNOSIS — E113291 Type 2 diabetes mellitus with mild nonproliferative diabetic retinopathy without macular edema, right eye: Secondary | ICD-10-CM | POA: Diagnosis not present

## 2021-04-25 LAB — HM DIABETES EYE EXAM

## 2021-05-08 DIAGNOSIS — E1165 Type 2 diabetes mellitus with hyperglycemia: Secondary | ICD-10-CM | POA: Diagnosis not present

## 2021-05-29 ENCOUNTER — Other Ambulatory Visit: Payer: Self-pay | Admitting: Internal Medicine

## 2021-06-07 ENCOUNTER — Ambulatory Visit (INDEPENDENT_AMBULATORY_CARE_PROVIDER_SITE_OTHER): Payer: Medicare Other | Admitting: Pharmacist

## 2021-06-07 ENCOUNTER — Other Ambulatory Visit: Payer: Self-pay

## 2021-06-07 VITALS — BP 138/82 | HR 62 | Wt 202.0 lb

## 2021-06-07 DIAGNOSIS — Z23 Encounter for immunization: Secondary | ICD-10-CM | POA: Diagnosis not present

## 2021-06-07 DIAGNOSIS — E1122 Type 2 diabetes mellitus with diabetic chronic kidney disease: Secondary | ICD-10-CM

## 2021-06-07 DIAGNOSIS — N184 Chronic kidney disease, stage 4 (severe): Secondary | ICD-10-CM | POA: Diagnosis not present

## 2021-06-07 DIAGNOSIS — E1159 Type 2 diabetes mellitus with other circulatory complications: Secondary | ICD-10-CM | POA: Diagnosis not present

## 2021-06-07 DIAGNOSIS — I10 Essential (primary) hypertension: Secondary | ICD-10-CM

## 2021-06-07 DIAGNOSIS — Z794 Long term (current) use of insulin: Secondary | ICD-10-CM | POA: Diagnosis not present

## 2021-06-07 DIAGNOSIS — I25119 Atherosclerotic heart disease of native coronary artery with unspecified angina pectoris: Secondary | ICD-10-CM

## 2021-06-07 DIAGNOSIS — E1165 Type 2 diabetes mellitus with hyperglycemia: Secondary | ICD-10-CM | POA: Diagnosis not present

## 2021-06-07 MED ORDER — TRESIBA FLEXTOUCH 200 UNIT/ML ~~LOC~~ SOPN: 32.0000 [IU] | PEN_INJECTOR | Freq: Every day | SUBCUTANEOUS | 3 refills | Status: DC

## 2021-06-07 MED ORDER — CARVEDILOL 25 MG PO TABS: 25.0000 mg | ORAL_TABLET | Freq: Two times a day (BID) | ORAL | 1 refills | Status: DC

## 2021-06-07 MED ORDER — INSULIN LISPRO (1 UNIT DIAL) 100 UNIT/ML (KWIKPEN): 5.0000 [IU] | PEN_INJECTOR | Freq: Three times a day (TID) | SUBCUTANEOUS | 1 refills | Status: DC

## 2021-06-07 NOTE — Patient Instructions (Signed)
Please call [facility] and have them send your eye exam results over to our office.   We recommend you get the updated bivalent COVID-19 booster, at least 2 months after any prior doses. You may consider delaying a booster dose by 3 months from a prior episode of COVID-19 per the CDC.   You can find pharmacies that have this formulation in stock at [email or web address]   You can get your Shingles vaccine series at your local pharmacy, but there will be a $0 copay starting in January.   Decrease Tresiba to 32 units daily. Take Humalog 5 units EVERY DAY BEFORE SUPPER.   Call me if you have low blood sugar readings.   Please check blood pressures at home. Reduce your salt intake - bacon, salted meats, canned foods. Bring those blood pressure readings to follow up with Dr. Juleen China in a few weeks.   Schedule labs and follow up in office with Dr. Olivia Mackie after January 25th.   Take care!  [name], PharmD  Visit Information  PATIENT GOALS:  Goals Addressed               This Visit's Progress     Patient Stated     Medication Monitoring (pt-stated)        Patient Goals/Self-Care Activities Over the next 90 days, patient will:  - take medications as prescribed check glucose at least three times daily using CGM, document, and provide at future appointments        Print copy of patient instructions, educational materials, and care plan provided in person. Plan: Face to Face appointment with care management team member scheduled for: 12 weeks  [name], PharmD, [place], [place] Clinical Pharmacist [employer] at [facility] [phone]

## 2021-06-07 NOTE — Chronic Care Management (AMB) (Signed)
Chronic Care Management Pharmacy Note  06/07/2021 Name:  Vickie Kemp MRN:  [id number] DOB:  04-Jul-1941  Subjective: Vickie Kemp is an 80 y.o. year old female who is a primary patient of McLean-Scocuzza, Nino Glow, MD.  The CCM team was consulted for assistance with disease management and care coordination needs.    Engaged with patient face to face for follow up visit for pharmacy case management and/or care coordination services.   Objective:  Medications Reviewed Today     Reviewed by De Hollingshead, RPH-CPP (Pharmacist) on 06/07/21 at 1413  Med List Status: <None>   Medication Order Taking? Sig Documenting Provider Last Dose Status Informant  acetaminophen (TYLENOL) 650 MG CR tablet [id number] Yes Take 650 mg by mouth 3 (three) times daily as needed. [provider] Taking Active   amLODipine (NORVASC) 5 MG tablet [id number] Yes Take 1 tablet (5 mg total) by mouth daily. Kate Sable, MD Taking Active   aspirin EC 81 MG tablet [id number] Yes Take 81 mg by mouth daily. [provider] Taking Active   atorvastatin (LIPITOR) 40 MG tablet [id number] Yes Take 1 tablet (40 mg total) by mouth daily. Qhs McLean-Scocuzza, Nino Glow, MD Taking Active   carvedilol (COREG) 25 MG tablet [id number] Yes Take 1 tablet (25 mg total) by mouth 2 (two) times daily. Kate Sable, MD Taking Active   Cholecalciferol (VITAMIN D3 PO) [id number] Yes Take by mouth. [provider] Taking Active   Continuous Blood Gluc Sensor (FREESTYLE LIBRE 2 SENSOR) [place] [id number] Yes by Does not apply route. [provider] Taking Active   furosemide (LASIX) 40 MG tablet [id number] Yes Take 1 tablet (40 mg total) by mouth in the morning and at bedtime. Kate Sable, MD Taking Active            Med Note (Maynard Jun 07, 2021  2:01 PM) QAM  insulin degludec (TRESIBA FLEXTOUCH) 200 UNIT/ML FlexTouch Pen [id number] Yes Inject 36 Units into the skin daily.  McLean-Scocuzza, Nino Glow, MD Taking Active   insulin lispro (HUMALOG KWIKPEN) 100 UNIT/ML KwikPen [id number] Yes Prn based on sugar If sugar 70-130 0 units If sugar 131-180 4 units If sugar 181-240 8 units If sugar 241-300 10 units If sugar 301-350 12 units If sugar 351-400 16 units If >400 20 units and call doctor McLean-Scocuzza, Nino Glow, MD Taking Active   Insulin Pen Needle (PEN NEEDLES) 31G X 8 MM MISC [id number]  1 pen by Does not apply route 2 (two) times daily as needed. McLean-Scocuzza, Nino Glow, MD  Active   Insulin Pen Needle 32G X 4 MM MISC [id number]  1 Device by Does not apply route 4 (four) times daily - after meals and at bedtime. McLean-Scocuzza, Nino Glow, MD  Active   latanoprost (XALATAN) 0.005 % ophthalmic solution [id number] Yes Place 1 drop into the left eye at bedtime. [provider] Taking Active   losartan (COZAAR) 100 MG tablet [id number] Yes Take 1 tablet (100 mg total) by mouth daily. McLean-Scocuzza, Nino Glow, MD Taking Active   [facility] ULTRA test strip [id number] Yes USE TO CHECK BLOOD SUGAR THREE TIMES DAILY McLean-Scocuzza, Nino Glow, MD Taking Active   Turmeric (QC TUMERIC COMPLEX PO) [id number] Yes Take by mouth.  [provider] Taking Active            Lab Results  Component Value Date   HGBA1C 7.8 (H) 02/15/2021   Lab Results  Component Value Date   CHOL 79  02/15/2021   HDL 26.70 (L) 02/15/2021   LDLCALC 42 02/15/2021   TRIG 50.0 02/15/2021   CHOLHDL 3 02/15/2021   Lab Results  Component Value Date   CREATININE 1.49 (H) 02/15/2021   BUN 18 02/15/2021   NA 142 02/15/2021   K 3.9 02/15/2021   CL 105 02/15/2021   CO2 31 02/15/2021     Assessment/Interventions: Review of patient past medical history, allergies, medications, health status, including review of consultants reports, laboratory and other test data, was performed as part of comprehensive evaluation and provision of chronic care management services.   SDOH:  (Social  Determinants of Health) assessments and interventions performed: Yes SDOH Interventions    Flowsheet Row Most Recent Value  SDOH Interventions   Financial Strain Interventions Intervention Not Indicated        CCM Care Plan  Review of patient past medical history, allergies, medications, health status, including review of consultants reports, laboratory and other test data, was performed as part of comprehensive evaluation and provision of chronic care management services.   Conditions to be addressed/monitored:  Hypertension, Hyperlipidemia, Diabetes, and Coronary Artery Disease  Care Plan : Medication Management  Updates made by De Hollingshead, RPH-CPP since 06/07/2021 12:00 AM     Problem: Diabetes, CAD, PVD, HTN      Long-Range Goal: Disease Progression Prevention   Start Date: 06/18/2020  Expected End Date: 09/18/2020  Recent Progress: On track  Priority: High  Note:   Current Barriers:  Unable to achieve control of diabetes   Pharmacist Clinical Goal(s):  Over the next 90 days, patient will improve control of diabetes through collaboration with PharmD and provider.   Interventions: 1:1 collaboration with McLean-Scocuzza, Nino Glow, MD regarding development and update of comprehensive plan of care as evidenced by provider attestation and co-signature Inter-disciplinary care team collaboration (see longitudinal plan of care) Comprehensive medication review performed; medication list updated in electronic medical record  Health Maintenance   Yearly diabetic eye exam: up to date - encouraged her to have results faxed to [place] from [facility] Yearly diabetic foot exam: up to date Urine microalbumin: up to date Yearly influenza vaccination: due - received today Td/Tdap vaccination: up to date Pneumonia vaccination: due - discuss moving forward COVID vaccinations: due - recommended to pursue bivalent booster at local pharmacy Shingrix vaccinations: due - discussed to  pursue in 2023  Diabetes: Uncontrolled but improved; current treatment: Tresiba 25 units daily, Humalog 5 units with supper "if BG >240", though reports she often doesn't take due to worries about hypoglycemia Renal function limits use of metformin Current glucose readings: Date of Download: 10/12-10/25/22 % Time CGM is active: 97% Average Glucose: 146 mg/dL Glucose Management Indicator: 6.8%  Glucose Variability: 34.2 (goal <36%) Time in Goal:  - Time in range 70-180: 76% - Time above range: 24% - Time below range: 0% Observed patterns: post supper elevations Current meal patterns: snacking to proactively prevent hypoglycemia Moving forward, reconsider option for SGLT2 given renal benefit Reduce Tresiba to 32 units daily to reduce risk of hypoglycemia. Recommend to take Humalog 5 units every day with supper. Patient verbalized understanding.   Hypertension in the setting of CKD: Moderately well controlled BP today in office; current treatment: amlodipine 5 mg daily QPM, losartan 100 mg QAM, carvedilol 25 mg BID, furosemide 40 mg BID - reports taking once daily Hx edema with amlodipine 10 mg Discussed change from losartan to telmisartan due to improved pharmacokinetics, benefits. Patient declines. Encouraged to reduce sodium intake,  check BP readings at home and bring with her to nephrology f/u next month.  Recommend to continue current regimen at this time.  Review use of SGLT2 moving forward.   Hyperlipidemia, secondary ASCVD prevention (CAD, MI 2014, PVD) Controlled per last lipid panel; current treatment: atorvastatin 40 mg daily Antiplatelet regimen: aspirin 81 mg daily Recommended to continue current regimen at this time  Patient Goals/Self-Care Activities Over the next 90 days, patient will:  - take medications as prescribed check glucose three times daily using CGM, document, and provide at future appointments  Follow Up Plan: Face to Face appointment with care management  team member scheduled for:  3 months      Plan: Face to Face appointment with care management team member scheduled for: 12 weeks  [name], PharmD, [place], [place] Pharmacist [employer] at [place]

## 2021-06-08 DIAGNOSIS — E1165 Type 2 diabetes mellitus with hyperglycemia: Secondary | ICD-10-CM | POA: Diagnosis not present

## 2021-06-08 LAB — LIPID PANEL
Cholesterol: 91 mg/dL (ref 0–200)
HDL: 30.1 mg/dL — ABNORMAL LOW (ref >39.00)
LDL Cholesterol: 50 mg/dL (ref 0–99)
NonHDL: 60.89
Total CHOL/HDL Ratio: 3
Triglycerides: 56 mg/dL (ref 0.0–149.0)
VLDL: 11.2 mg/dL (ref 0.0–40.0)

## 2021-06-08 LAB — COMPREHENSIVE METABOLIC PANEL
ALT: 8 U/L (ref 0–35)
AST: 16 U/L (ref 0–37)
Albumin: 4.1 g/dL (ref 3.5–5.2)
Alkaline Phosphatase: 90 U/L (ref 39–117)
BUN: 18 mg/dL (ref 6–23)
CO2: 32 mEq/L (ref 19–32)
Calcium: 9.7 mg/dL (ref 8.4–10.5)
Chloride: 104 mEq/L (ref 96–112)
Creatinine, Ser: 1.34 mg/dL — ABNORMAL HIGH (ref 0.40–1.20)
GFR: 37.71 mL/min — ABNORMAL LOW (ref >60.00)
Glucose, Bld: 130 mg/dL — ABNORMAL HIGH (ref 70–99)
Potassium: 4.7 mEq/L (ref 3.5–5.1)
Sodium: 143 mEq/L (ref 135–145)
Total Bilirubin: 0.5 mg/dL (ref 0.2–1.2)
Total Protein: 7.4 g/dL (ref 6.0–8.3)

## 2021-06-08 LAB — CBC WITH DIFFERENTIAL/PLATELET
Basophils Absolute: 0.1 10*3/uL (ref 0.0–0.1)
Basophils Relative: 1.1 % (ref 0.0–3.0)
Eosinophils Absolute: 0.2 10*3/uL (ref 0.0–0.7)
Eosinophils Relative: 2.5 % (ref 0.0–5.0)
HCT: 37.2 % (ref 36.0–46.0)
Hemoglobin: 11.9 g/dL — ABNORMAL LOW (ref 12.0–15.0)
Lymphocytes Relative: 26.8 % (ref 12.0–46.0)
Lymphs Abs: 2.3 10*3/uL (ref 0.7–4.0)
MCHC: 32.1 g/dL (ref 30.0–36.0)
MCV: 88.3 fl (ref 78.0–100.0)
Monocytes Absolute: 0.7 10*3/uL (ref 0.1–1.0)
Monocytes Relative: 8 % (ref 3.0–12.0)
Neutro Abs: 5.2 10*3/uL (ref 1.4–7.7)
Neutrophils Relative %: 61.6 % (ref 43.0–77.0)
Platelets: 188 10*3/uL (ref 150.0–400.0)
RBC: 4.21 Mil/uL (ref 3.87–5.11)
RDW: 15.6 % — ABNORMAL HIGH (ref 11.5–15.5)
WBC: 8.4 10*3/uL (ref 4.0–10.5)

## 2021-06-08 LAB — LDL CHOLESTEROL, DIRECT: Direct LDL: 49 mg/dL

## 2021-06-08 LAB — HEMOGLOBIN A1C: Hgb A1c MFr Bld: 8.3 % — ABNORMAL HIGH (ref 4.6–6.5)

## 2021-06-13 ENCOUNTER — Encounter: Payer: Self-pay | Admitting: Internal Medicine

## 2021-06-13 DIAGNOSIS — E1122 Type 2 diabetes mellitus with diabetic chronic kidney disease: Secondary | ICD-10-CM | POA: Diagnosis not present

## 2021-06-13 DIAGNOSIS — E1165 Type 2 diabetes mellitus with hyperglycemia: Secondary | ICD-10-CM

## 2021-06-13 DIAGNOSIS — I25119 Atherosclerotic heart disease of native coronary artery with unspecified angina pectoris: Secondary | ICD-10-CM

## 2021-06-13 DIAGNOSIS — N184 Chronic kidney disease, stage 4 (severe): Secondary | ICD-10-CM

## 2021-06-13 DIAGNOSIS — E1159 Type 2 diabetes mellitus with other circulatory complications: Secondary | ICD-10-CM | POA: Diagnosis not present

## 2021-06-13 DIAGNOSIS — Z794 Long term (current) use of insulin: Secondary | ICD-10-CM | POA: Diagnosis not present

## 2021-06-13 DIAGNOSIS — I10 Essential (primary) hypertension: Secondary | ICD-10-CM

## 2021-06-14 ENCOUNTER — Encounter: Payer: Self-pay | Admitting: Internal Medicine

## 2021-06-21 ENCOUNTER — Other Ambulatory Visit: Payer: Self-pay

## 2021-06-21 ENCOUNTER — Encounter (INDEPENDENT_AMBULATORY_CARE_PROVIDER_SITE_OTHER): Payer: Self-pay | Admitting: Nurse Practitioner

## 2021-06-21 ENCOUNTER — Ambulatory Visit (INDEPENDENT_AMBULATORY_CARE_PROVIDER_SITE_OTHER): Payer: Medicare Other | Admitting: Nurse Practitioner

## 2021-06-21 VITALS — BP 121/62 | HR 74 | Resp 16 | Wt 201.0 lb

## 2021-06-21 DIAGNOSIS — I10 Essential (primary) hypertension: Secondary | ICD-10-CM | POA: Diagnosis not present

## 2021-06-21 DIAGNOSIS — I890 Lymphedema, not elsewhere classified: Secondary | ICD-10-CM | POA: Diagnosis not present

## 2021-06-22 ENCOUNTER — Other Ambulatory Visit (HOSPITAL_BASED_OUTPATIENT_CLINIC_OR_DEPARTMENT_OTHER): Payer: Self-pay | Admitting: Orthopedic Surgery

## 2021-06-22 ENCOUNTER — Other Ambulatory Visit: Payer: Self-pay | Admitting: Orthopedic Surgery

## 2021-06-22 DIAGNOSIS — M4316 Spondylolisthesis, lumbar region: Secondary | ICD-10-CM | POA: Diagnosis not present

## 2021-06-22 DIAGNOSIS — D631 Anemia in chronic kidney disease: Secondary | ICD-10-CM | POA: Diagnosis not present

## 2021-06-22 DIAGNOSIS — M4807 Spinal stenosis, lumbosacral region: Secondary | ICD-10-CM

## 2021-06-22 DIAGNOSIS — I739 Peripheral vascular disease, unspecified: Secondary | ICD-10-CM | POA: Diagnosis not present

## 2021-06-22 DIAGNOSIS — N2581 Secondary hyperparathyroidism of renal origin: Secondary | ICD-10-CM | POA: Diagnosis not present

## 2021-06-22 DIAGNOSIS — N1832 Chronic kidney disease, stage 3b: Secondary | ICD-10-CM | POA: Diagnosis not present

## 2021-06-22 DIAGNOSIS — E1122 Type 2 diabetes mellitus with diabetic chronic kidney disease: Secondary | ICD-10-CM | POA: Diagnosis not present

## 2021-06-22 DIAGNOSIS — M25552 Pain in left hip: Secondary | ICD-10-CM | POA: Diagnosis not present

## 2021-06-22 DIAGNOSIS — E1159 Type 2 diabetes mellitus with other circulatory complications: Secondary | ICD-10-CM | POA: Diagnosis not present

## 2021-06-22 DIAGNOSIS — I152 Hypertension secondary to endocrine disorders: Secondary | ICD-10-CM | POA: Diagnosis not present

## 2021-06-22 DIAGNOSIS — I129 Hypertensive chronic kidney disease with stage 1 through stage 4 chronic kidney disease, or unspecified chronic kidney disease: Secondary | ICD-10-CM | POA: Diagnosis not present

## 2021-07-02 ENCOUNTER — Encounter (INDEPENDENT_AMBULATORY_CARE_PROVIDER_SITE_OTHER): Payer: Self-pay | Admitting: Nurse Practitioner

## 2021-07-02 NOTE — Progress Notes (Signed)
Subjective:    Patient ID: Vickie Kemp, female    DOB: 07-01-1941, 80 y.o.   MRN: [id number] Chief Complaint  Patient presents with   Follow-up    6 month follow up    Vickie Kemp is a 80 year old female that returns to the office for followup evaluation regarding leg swelling.  The swelling has persisted but with the lymph pump is much, much better controlled. The pain associated with swelling is essentially eliminated. There have been some small blisters but these have healed relatively quickly.  The patient denies problems with the pump, noting it is working well and the leggings are in good condition.  Since the previous visit the patient has been wearing graduated compression stockings and using the lymph pump on a routine basis and  has noted significant improvement in the lymphedema.   Patient stated the lymph pump has been a very positive factor in her care.      Review of Systems  Cardiovascular:  Positive for leg swelling.  All other systems reviewed and are negative.     Objective:   Physical Exam Vitals reviewed.  HENT:     Head: Normocephalic.  Cardiovascular:     Rate and Rhythm: Normal rate.     Pulses: Decreased pulses.  Pulmonary:     Effort: Pulmonary effort is normal.  Musculoskeletal:     Right lower leg: 1+ Edema present.     Left lower leg: 1+ Edema present.  Skin:    General: Skin is warm and dry.  Neurological:     Mental Status: She is alert and oriented to person, place, and time.  Psychiatric:        Mood and Affect: Mood normal.        Behavior: Behavior normal.        Thought Content: Thought content normal.        Judgment: Judgment normal.    BP 121/62 (BP Location: Left Arm)   Pulse 74   Resp 16   Wt 201 lb (91.2 kg)   LMP  (LMP Unknown)   BMI 33.45 kg/m   Past Medical History:  Diagnosis Date   Chronic kidney disease    Coronary artery disease    MI, pci to mid LAD 2014. LHC 80%OM, 60%RCA, 90% LAD   Diabetes  mellitus without complication (HCC)    Hypertension    Sleep apnea     Social History   Socioeconomic History   Marital status: Single    Spouse name: Not on file   Number of children: Not on file   Years of education: Not on file   Highest education level: Not on file  Occupational History   Not on file  Tobacco Use   Smoking status: Never   Smokeless tobacco: Never  Vaping Use   Vaping Use: Never used  Substance and Sexual Activity   Alcohol use: Never   Drug use: Never   Sexual activity: Not on file  Other Topics Concern   Not on file  Social History Narrative   Used to live in [place] Determinants of Health   Financial Resource Strain: Low Risk    Difficulty of Paying Living Expenses: Not hard at all  Food Insecurity: No Food Insecurity   Worried About Charity fundraiser in the Last Year: Never true   [place] in the Last Year: Never true  Transportation Needs:  No Transportation Needs   Lack of Transportation (Medical): No   Lack of Transportation (Non-Medical): No  Physical Activity: Not on file  Stress: No Stress Concern Present   Feeling of Stress : Not at all  Social Connections: Unknown   Frequency of Communication with Friends and Family: More than three times a week   Frequency of Social Gatherings with Friends and Family: More than three times a week   Attends Religious Services: More than 4 times per year   Active Member of [employer] or Organizations: Yes   Attends Archivist Meetings: Not on file   Marital Status: Not on file  Intimate Partner Violence: Not At Risk   Fear of Current or Ex-Partner: No   Emotionally Abused: No   Physically Abused: No   Sexually Abused: No    Past Surgical History:  Procedure Laterality Date   ACNE CYST REMOVAL  2008   Groin area   CARDIAC CATHETERIZATION     COLONOSCOPY WITH PROPOFOL N/A 05/27/2020   Procedure: COLONOSCOPY WITH PROPOFOL;  Surgeon: [name],  MD;  Location: [facility] ENDOSCOPY;  Service: Gastroenterology;  Laterality: N/A;   CORONARY ANGIOPLASTY      Family History  Problem Relation Age of Onset   Diabetes Mother    Heart disease Father    Breast cancer Neg Hx     Allergies  Allergen Reactions   Amoxicillin Itching    CBC Latest Ref Rng & Units 06/07/2021 02/15/2021 06/16/2020  WBC 4.0 - 10.5 K/uL 8.4 8.2 8.3  Hemoglobin 12.0 - 15.0 g/dL 11.9(L) 12.3 12.2  Hematocrit 36.0 - 46.0 % 37.2 37.7 38.0  Platelets 150.0 - 400.0 K/uL 188.0 183.0 202.0      CMP     Component Value Date/Time   NA 143 06/07/2021 1440   K 4.7 06/07/2021 1440   CL 104 06/07/2021 1440   CO2 32 06/07/2021 1440   GLUCOSE 130 (H) 06/07/2021 1440   BUN 18 06/07/2021 1440   CREATININE 1.34 (H) 06/07/2021 1440   CALCIUM 9.7 06/07/2021 1440   PROT 7.4 06/07/2021 1440   ALBUMIN 4.1 06/07/2021 1440   AST 16 06/07/2021 1440   ALT 8 06/07/2021 1440   ALKPHOS 90 06/07/2021 1440   BILITOT 0.5 06/07/2021 1440   GFRNONAA 31 (L) 06/02/2020 1103   GFRAA 37 (L) 04/24/2020 1119     No results found.     Assessment & Plan:   1. Lymphedema  No surgery or intervention at this point in time.    I have reviewed my discussion with the patient regarding lymphedema and why it  causes symptoms.  Patient will continue wearing graduated compression stockings class 1 (20-30 mmHg) on a daily basis a prescription was given. The patient is reminded to put the stockings on first thing in the morning and removing them in the evening. The patient is instructed specifically not to sleep in the stockings.   In addition, behavioral modification throughout the day will be continued.  This will include frequent elevation (such as in a recliner), use of over the counter pain medications as needed and exercise such as walking.  I have reviewed systemic causes for chronic edema such as liver, kidney and cardiac etiologies and there does not appear to be any significant changes in  these organ systems over the past year.  The patient is under the impression that these organ systems are all stable and unchanged.    The patient will continue aggressive use of  the  lymph pump.  This will continue to improve the edema control and prevent sequela such as ulcers and infections.   The patient will follow-up with me on an annual basis.    2. Essential (primary) hypertension Continue antihypertensive medications as already ordered, these medications have been reviewed and there are no changes at this time.    Current Outpatient Medications on File Prior to Visit  Medication Sig Dispense Refill   acetaminophen (TYLENOL) 650 MG CR tablet Take 650 mg by mouth 3 (three) times daily as needed.     amLODipine (NORVASC) 5 MG tablet Take 1 tablet (5 mg total) by mouth daily. 90 tablet 2   aspirin EC 81 MG tablet Take 81 mg by mouth daily.     atorvastatin (LIPITOR) 40 MG tablet Take 1 tablet (40 mg total) by mouth daily. Qhs 90 tablet 3   carvedilol (COREG) 25 MG tablet Take 1 tablet (25 mg total) by mouth 2 (two) times daily. 180 tablet 1   Cholecalciferol (VITAMIN D3 PO) Take by mouth.     Continuous Blood Gluc Sensor (FREESTYLE LIBRE 2 SENSOR) MISC by Does not apply route.     furosemide (LASIX) 40 MG tablet Take 1 tablet (40 mg total) by mouth in the morning and at bedtime. 60 tablet 6   insulin degludec (TRESIBA FLEXTOUCH) 200 UNIT/ML FlexTouch Pen Inject 32 Units into the skin daily. 30 mL 3   insulin lispro (HUMALOG KWIKPEN) 100 UNIT/ML KwikPen Inject 5 Units into the skin 3 (three) times daily. 15 mL 1   Insulin Pen Needle (PEN NEEDLES) 31G X 8 MM MISC 1 pen by Does not apply route 2 (two) times daily as needed. 100 each 3   Insulin Pen Needle 32G X 4 MM MISC 1 Device by Does not apply route 4 (four) times daily - after meals and at bedtime. 360 each 3   latanoprost (XALATAN) 0.005 % ophthalmic solution Place 1 drop into the left eye at bedtime.     losartan (COZAAR) 100 MG  tablet Take 1 tablet (100 mg total) by mouth daily. 90 tablet 3   ONETOUCH ULTRA test strip USE TO CHECK BLOOD SUGAR THREE TIMES DAILY 300 strip 5   Turmeric (QC TUMERIC COMPLEX PO) Take by mouth.      No current facility-administered medications on file prior to visit.    There are no Patient Instructions on file for this visit. No follow-ups on file.   Kris Hartmann, NP

## 2021-07-05 ENCOUNTER — Ambulatory Visit
Admission: RE | Admit: 2021-07-05 | Discharge: 2021-07-05 | Disposition: A | Discharge status: Home / Self Care | Payer: Medicare Other | Source: Ambulatory Visit | Attending: Orthopedic Surgery | Admitting: Orthopedic Surgery

## 2021-07-05 ENCOUNTER — Other Ambulatory Visit: Payer: Self-pay

## 2021-07-05 DIAGNOSIS — M4807 Spinal stenosis, lumbosacral region: Secondary | ICD-10-CM | POA: Diagnosis not present

## 2021-07-05 DIAGNOSIS — M545 Low back pain, unspecified: Secondary | ICD-10-CM | POA: Diagnosis not present

## 2021-07-05 IMAGING — MR MR LUMBAR SPINE W/O CM
5 series · 31 of 48 positions shown · non-contrast
Comparison: [DATE].

CLINICAL DATA: Low back pain with left flank pain radiates down
left leg into foot

EXAM:
MRI LUMBAR SPINE WITHOUT CONTRAST
TECHNIQUE: Multiplanar, multisequence MR imaging of the lumbar spine was
performed. No intravenous contrast was administered.

[Series 5: T2 · sagittal · 4.0mm · 0.81mm/px · 6 of 17 slices shown (1 of 2)]
[im 1/17]
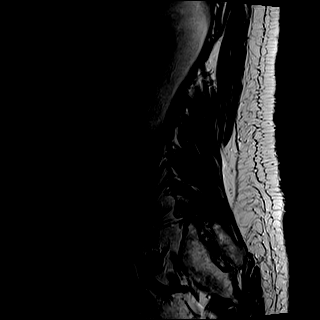
[im 4/17]
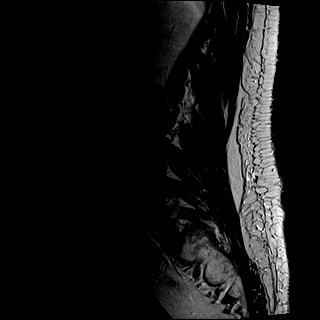
[im 7/17]
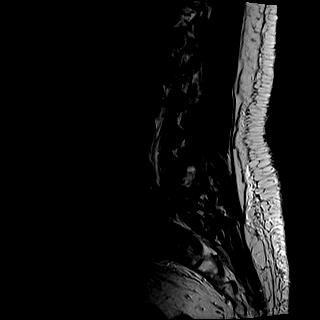
[im 10/17]
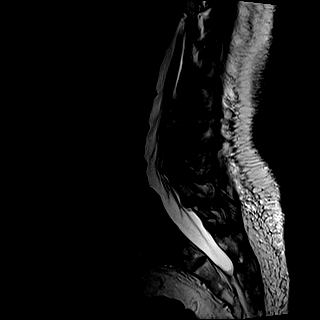
[im 13/17]
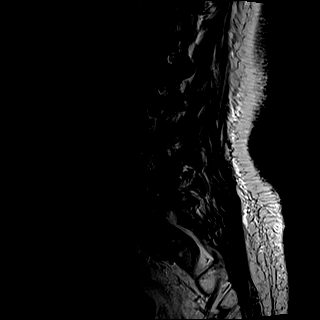
[im 17/17]
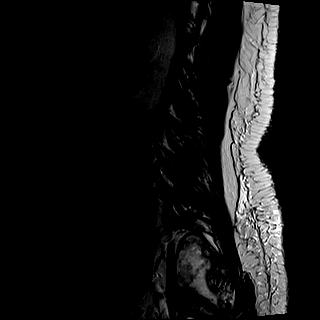

[Series 6: T1 · sagittal · 4.0mm · 0.81mm/px · 7 of 17 slices shown (1 of 2)]
[im 1/17]
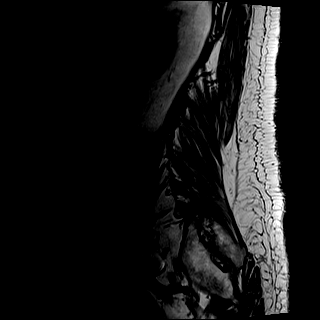
[im 3/17]
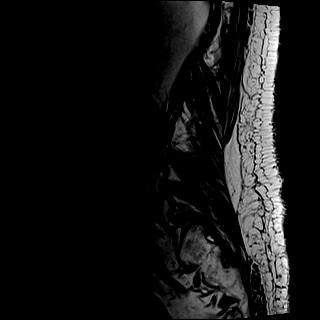
[im 6/17]
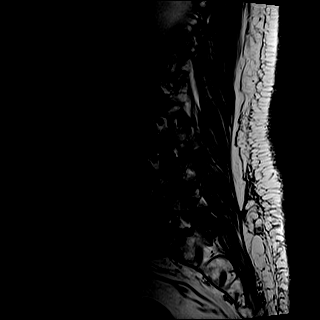
[im 9/17]
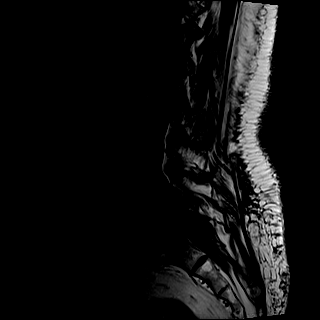
[im 11/17]
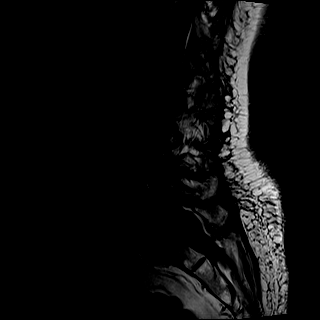
[im 14/17]
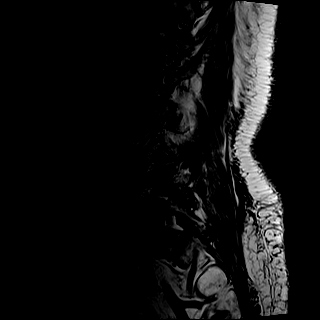
[im 17/17]
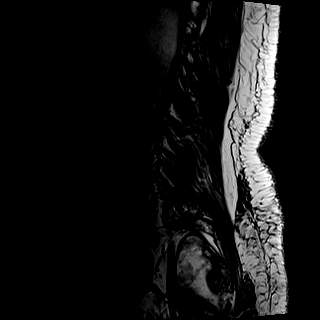

[Series 7: STIR · sagittal · 4.0mm · 0.41mm/px · 2 of 17 slices shown]
[im 1/17]
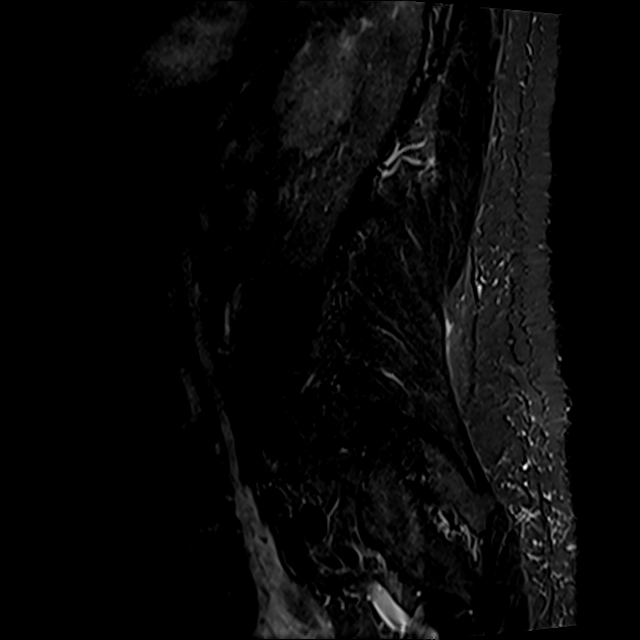
[im 3/17]
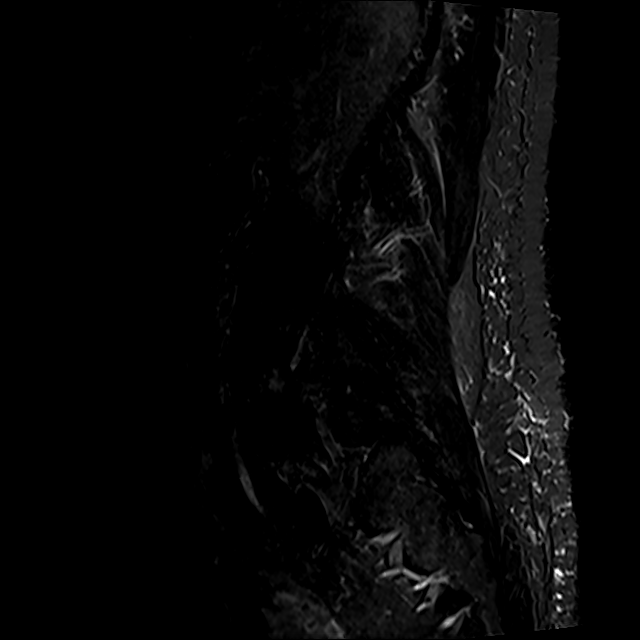

[Series 8: T2 · axial · 4.0mm · 0.78mm/px · z∈[-38,+157]mm · 8 of 33 slices shown (2 of 2)]
[im 1/33]
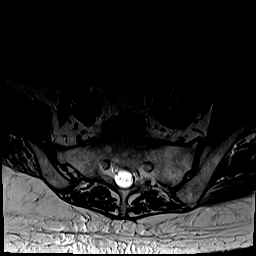
[im 5/33]
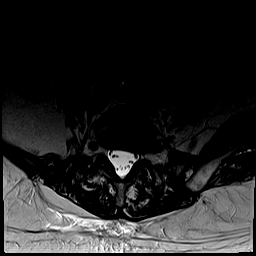
[im 10/33]
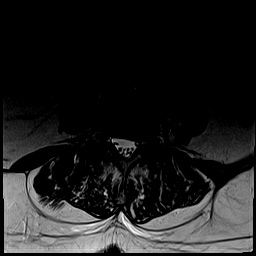
[im 15/33]
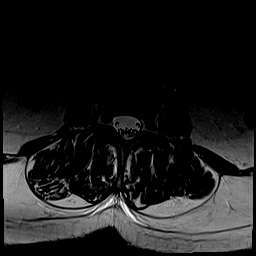
[im 18/33]
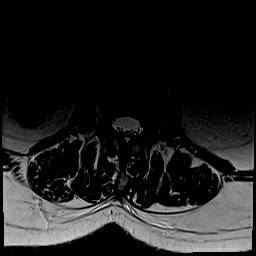
[im 23/33]
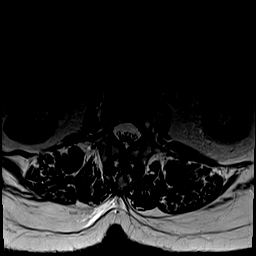
[im 28/33]
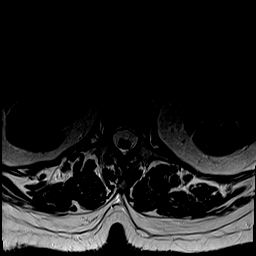
[im 33/33]
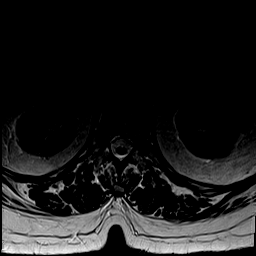

[Series 9: T1 · axial · 4.0mm · 0.39mm/px · z∈[-38,+157]mm · 8 of 33 slices shown (2 of 2)]
[im 1/33]
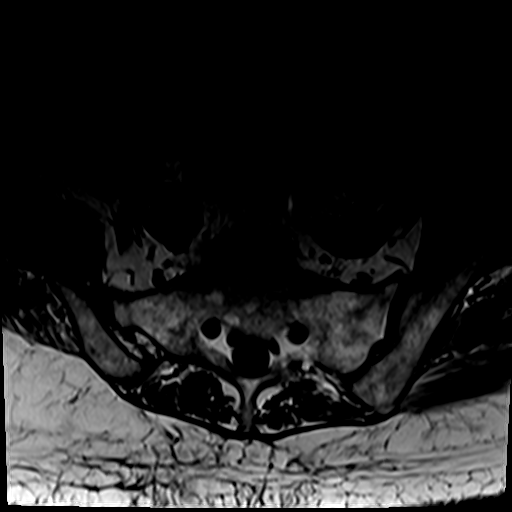
[im 5/33]
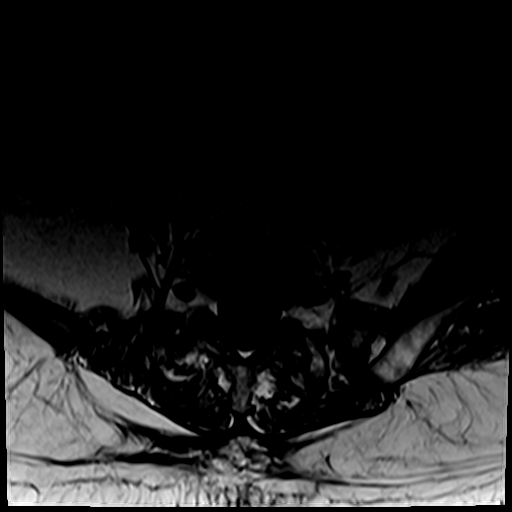
[im 10/33]
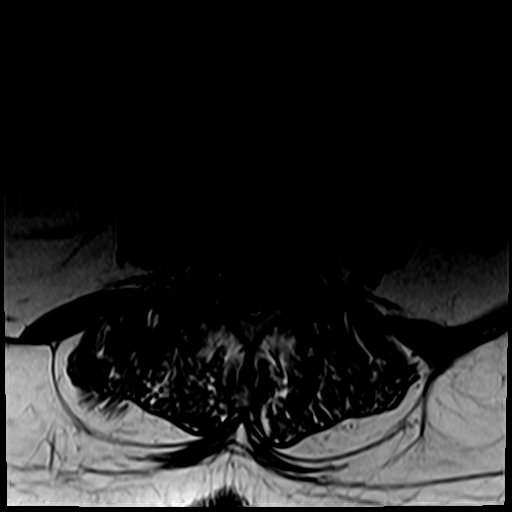
[im 15/33]
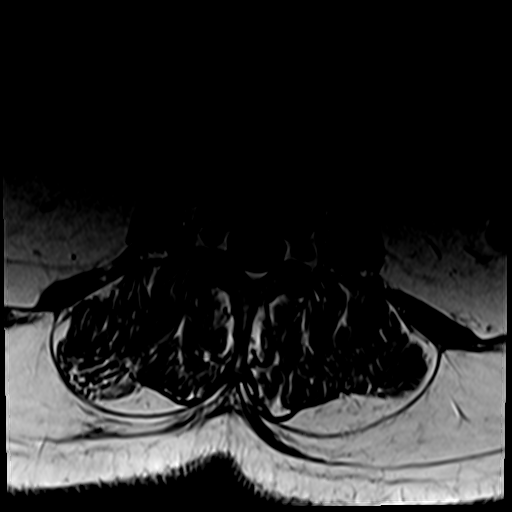
[im 18/33]
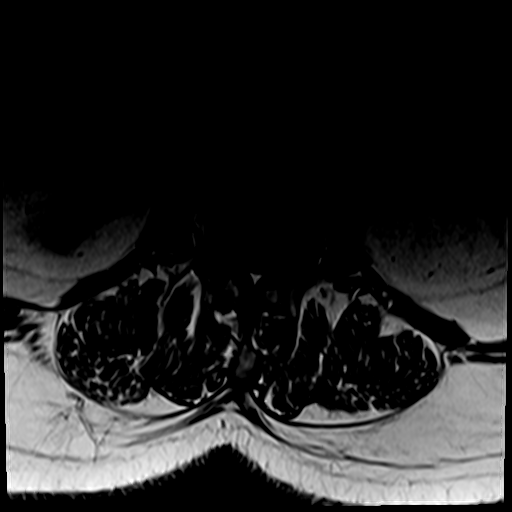
[im 23/33]
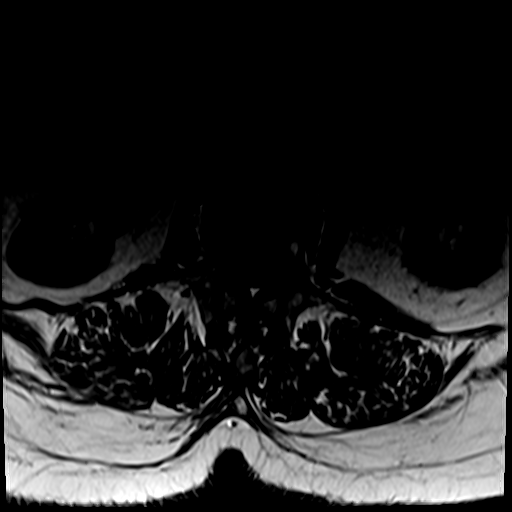
[im 28/33]
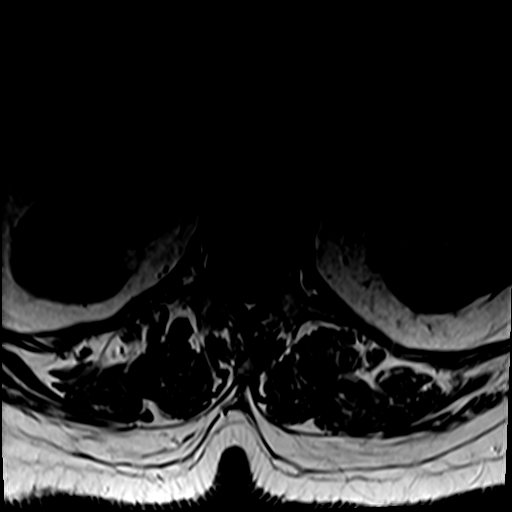
[im 33/33]
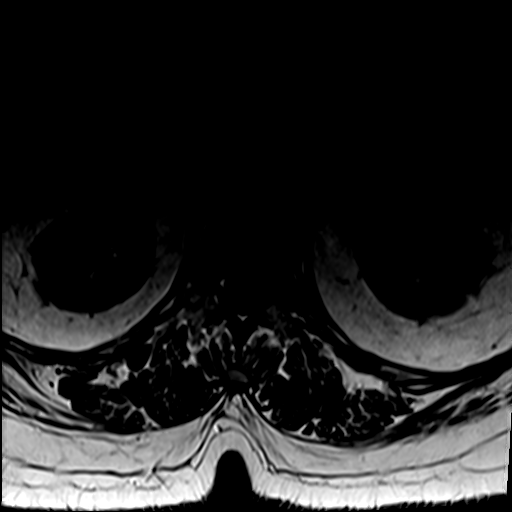

[31 of 48 positions shown; findings below may reference images not displayed]

FINDINGS: Segmentation:  Numbering is kept consistent with the prior study.

Alignment: Stable including anterolisthesis at L4-L5 and
levocurvature.

Vertebrae: Stable vertebral body heights including chronic loss of
height at the superior endplate of L2. No marrow edema. No
suspicious osseous lesion.

Conus medullaris and cauda equina: Conus extends to the L2-L3 level.
Conus and cauda equina appear normal.

Paraspinal and other soft tissues: Unremarkable.

Disc levels:

L1-L2: Minimal disc bulge and minor facet arthropathy. No
significant canal or foraminal stenosis.

L2-L3: Minimal disc bulge with superimposed small left foraminal
protrusion. Minor facet arthropathy. No significant canal or
foraminal stenosis.

L3-L4: Mild disc bulge. Mild facet arthropathy. No significant canal
or foraminal stenosis.

L4-L5: Anterolisthesis with uncovering of disc bulge and decreased
superimposed shallow left subarticular protrusion. Small endplate
osteophytes. Marked facet arthropathy with ligamentum flavum
infolding. No significant canal stenosis. Partial effacement of the
left subarticular recess has decreased. Mild right and mild to
moderate left foraminal stenosis.

L5-S1: Small left foraminal protrusion. Marked facet arthropathy. No
canal or right foraminal stenosis. Minimal left foraminal stenosis.
IMPRESSION: Multilevel degenerative changes as detailed above. Decreased disc
protrusion at L4-L5 with decreased effacement of the subarticular
recess. Left foraminal narrowing remains greatest at this level.
Marked facet arthropathy at L4-L5 and L5-S1.

## 2021-07-09 DIAGNOSIS — E1165 Type 2 diabetes mellitus with hyperglycemia: Secondary | ICD-10-CM | POA: Diagnosis not present

## 2021-07-25 ENCOUNTER — Encounter: Payer: Self-pay | Admitting: Internal Medicine

## 2021-07-26 ENCOUNTER — Other Ambulatory Visit: Payer: Self-pay

## 2021-07-26 NOTE — Telephone Encounter (Signed)
Please advise, Patient wanting to know if back injection will interfere with other conditions

## 2021-07-28 ENCOUNTER — Encounter: Payer: Self-pay | Admitting: Internal Medicine

## 2021-07-29 ENCOUNTER — Ambulatory Visit: Payer: Medicare Other | Admitting: Pharmacist

## 2021-07-29 DIAGNOSIS — Z794 Long term (current) use of insulin: Secondary | ICD-10-CM

## 2021-07-29 DIAGNOSIS — E1122 Type 2 diabetes mellitus with diabetic chronic kidney disease: Secondary | ICD-10-CM

## 2021-07-29 DIAGNOSIS — E1165 Type 2 diabetes mellitus with hyperglycemia: Secondary | ICD-10-CM

## 2021-07-29 DIAGNOSIS — I25119 Atherosclerotic heart disease of native coronary artery with unspecified angina pectoris: Secondary | ICD-10-CM

## 2021-07-29 DIAGNOSIS — I10 Essential (primary) hypertension: Secondary | ICD-10-CM

## 2021-07-29 DIAGNOSIS — E1159 Type 2 diabetes mellitus with other circulatory complications: Secondary | ICD-10-CM

## 2021-07-29 MED ORDER — INSULIN LISPRO (1 UNIT DIAL) 100 UNIT/ML (KWIKPEN): 8.0000 [IU] | PEN_INJECTOR | Freq: Three times a day (TID) | SUBCUTANEOUS | 1 refills | Status: DC

## 2021-07-29 NOTE — Patient Instructions (Signed)
Visit Information  Following are the goals we discussed today:  Patient Goals/Self-Care Activities Over the next 90 days, patient will:  - take medications as prescribed check glucose three times daily using CGM, document, and provide at future appointments        Plan: Face to Face appointment with care management team member scheduled for: 4 weeks as previously scheduled   [name], PharmD, [place], CPP Clinical Pharmacist [place] at [facility] [phone]   Please call the care guide team at [phone] if you need to cancel or reschedule your appointment.   Patient verbalizes understanding of instructions provided today and agrees to view in [place].

## 2021-07-29 NOTE — Chronic Care Management (AMB) (Signed)
Chronic Care Management CCM Pharmacy Note  07/29/2021 Name:  Vickie Kemp MRN:  [id number] DOB:  1941/04/03  Summary: - Post prandial hyperglycemia s/p cortisone injection  Recommendations/Changes made from today's visit: - Increase Humalog to 8 units with supper  Subjective: Vickie Kemp is an 80 y.o. year old female who is a primary patient of McLean-Scocuzza, Nino Glow, MD.  The CCM team was consulted for assistance with disease management and care coordination needs.    Engaged with patient by telephone for  medication management  for pharmacy case management and/or care coordination services.   Objective:  Medications Reviewed Today     Reviewed by Thressa Sheller, CMA (Certified Medical Assistant) on 07/26/21 at 0800  Med List Status: <None>   Medication Order Taking? Sig Documenting Provider Last Dose Status Informant  acetaminophen (TYLENOL) 650 MG CR tablet [id number]  Take 650 mg by mouth 3 (three) times daily as needed. [provider]  Active   amLODipine (NORVASC) 5 MG tablet [id number]  Take 1 tablet (5 mg total) by mouth daily. Kate Sable, MD  Active   aspirin EC 81 MG tablet [id number]  Take 81 mg by mouth daily. [provider]  Active   atorvastatin (LIPITOR) 40 MG tablet [id number]  Take 1 tablet (40 mg total) by mouth daily. Qhs McLean-Scocuzza, Nino Glow, MD  Active   carvedilol (COREG) 25 MG tablet [id number]  Take 1 tablet (25 mg total) by mouth 2 (two) times daily. McLean-Scocuzza, Nino Glow, MD  Active   Cholecalciferol (VITAMIN D3 PO) [id number]  Take by mouth. [provider]  Active   Continuous Blood Gluc Sensor (FREESTYLE LIBRE 2 SENSOR) [place] [id number]  by Does not apply route. [provider]  Active   furosemide (LASIX) 40 MG tablet [id number]  Take 1 tablet (40 mg total) by mouth in the morning and at bedtime. Kate Sable, MD  Active            Med Note ([place] Jun 07, 2021  2:01 PM)  QAM  insulin degludec (TRESIBA FLEXTOUCH) 200 UNIT/ML FlexTouch Pen [id number]  Inject 32 Units into the skin daily. McLean-Scocuzza, Nino Glow, MD  Active   insulin lispro (HUMALOG KWIKPEN) 100 UNIT/ML KwikPen [id number]  Inject 5 Units into the skin 3 (three) times daily. McLean-Scocuzza, Nino Glow, MD  Active   Insulin Pen Needle (PEN NEEDLES) 31G X 8 MM MISC [id number]  1 pen by Does not apply route 2 (two) times daily as needed. McLean-Scocuzza, Nino Glow, MD  Active   Insulin Pen Needle 32G X 4 MM MISC [id number]  1 Device by Does not apply route 4 (four) times daily - after meals and at bedtime. McLean-Scocuzza, Nino Glow, MD  Active   latanoprost (XALATAN) 0.005 % ophthalmic solution [id number]  Place 1 drop into the left eye at bedtime. [provider]  Active   losartan (COZAAR) 100 MG tablet [id number]  Take 1 tablet (100 mg total) by mouth daily. McLean-Scocuzza, Nino Glow, MD  Active   MAXITROL 3.5-10000-0.1 ophthalmic suspension [id number]  Apply 1 drop to eye 3 (three) times daily. [provider]  Active   Donald Siva test strip [id number]  USE TO CHECK BLOOD SUGAR THREE TIMES DAILY McLean-Scocuzza, Nino Glow, MD  Active   Turmeric (QC TUMERIC COMPLEX PO) [id number]  Take by mouth.  [provider]  Active             Pertinent Labs:   Lab  Results  Component Value Date   HGBA1C 8.3 (H) 06/07/2021   Lab Results  Component Value Date   CHOL 91 06/07/2021   HDL 30.10 (L) 06/07/2021   LDLCALC 50 06/07/2021   LDLDIRECT 49.0 06/07/2021   TRIG 56.0 06/07/2021   CHOLHDL 3 06/07/2021   Lab Results  Component Value Date   CREATININE 1.34 (H) 06/07/2021   BUN 18 06/07/2021   NA 143 06/07/2021   K 4.7 06/07/2021   CL 104 06/07/2021   CO2 32 06/07/2021    SDOH:  (Social Determinants of Health) assessments and interventions performed:  SDOH Interventions    Flowsheet Row Most Recent Value  SDOH Interventions   Financial Strain Interventions Intervention Not  Indicated       CCM Care Plan  Review of patient past medical history, allergies, medications, health status, including review of consultants reports, laboratory and other test data, was performed as part of comprehensive evaluation and provision of chronic care management services.   Care Plan : Medication Management  Updates made by De Hollingshead, RPH-CPP since 07/29/2021 12:00 AM     Problem: Diabetes, CAD, PVD, HTN      Long-Range Goal: Disease Progression Prevention   Start Date: 06/18/2020  Expected End Date: 09/18/2020  Recent Progress: On track  Priority: High  Note:   Current Barriers:  Unable to achieve control of diabetes   Pharmacist Clinical Goal(s):  Over the next 90 days, patient will improve control of diabetes through collaboration with PharmD and provider.   Interventions: 1:1 collaboration with McLean-Scocuzza, Nino Glow, MD regarding development and update of comprehensive plan of care as evidenced by provider attestation and co-signature Inter-disciplinary care team collaboration (see longitudinal plan of care) Comprehensive medication review performed; medication list updated in electronic medical record  Health Maintenance   Yearly diabetic eye exam: up to date - encouraged her to have results faxed to [place] from [facility] Yearly diabetic foot exam: up to date Urine microalbumin: up to date Yearly influenza vaccination: due - received today Td/Tdap vaccination: up to date Pneumonia vaccination: due - discuss moving forward COVID vaccinations: due - recommended to pursue bivalent booster at local pharmacy Shingrix vaccinations: due - discussed to pursue in 2023  Diabetes: Uncontrolled but improved; current treatment: Tresiba 32 units daily, Humalog 5 units with supper  Cortisone shot on 12/14.  Renal function limits use of metformin Current glucose readings: reports readings into the 200s after supper.  Increase supper Humalog to 8 units. Continue  to monitor. Reduce back to 5 if development of hypoglycemia after cortisone wears off.  Moving forward, reconsider option for SGLT2 given renal benefit. This was discussed by nephrology per documentation.   Hypertension in the setting of CKD: Moderately well controlled on last check; current treatment: amlodipine 5 mg daily QPM, losartan 100 mg QAM, carvedilol 25 mg BID, furosemide 40 mg BID - reports taking once daily Hx edema with amlodipine 10 mg Previously discussed change from losartan to telmisartan due to improved pharmacokinetics, benefits. Patient declined. Discussed SGLT2. Revisit moving forward.  Previously recommended to continue current regimen at this time.   Hyperlipidemia, secondary ASCVD prevention (CAD, MI 2014, PVD) Controlled per last lipid panel; current treatment: atorvastatin 40 mg daily Antiplatelet regimen: aspirin 81 mg daily Previously recommended to continue current regimen at this time  Patient Goals/Self-Care Activities Over the next 90 days, patient will:  - take medications as prescribed check glucose three times daily using CGM, document, and provide at future appointments  Plan: Face to Face appointment with care management team member scheduled for: 4 weeks as previously scheduled  [name], PharmD, [place], [place] Clinical Pharmacist [employer] at [facility] [phone]

## 2021-08-09 ENCOUNTER — Other Ambulatory Visit: Payer: Self-pay

## 2021-08-09 ENCOUNTER — Ambulatory Visit: Payer: Medicare Other | Attending: Internal Medicine

## 2021-08-09 DIAGNOSIS — Z23 Encounter for immunization: Secondary | ICD-10-CM

## 2021-08-09 MED ORDER — PFIZER COVID-19 VAC BIVALENT 30 MCG/0.3ML IM SUSP
INTRAMUSCULAR | 0 refills | Status: DC
  Filled 2021-08-09: qty 0.3, 1d supply, fill #0

## 2021-08-09 NOTE — Progress Notes (Signed)
° °  Covid-19 Vaccination Clinic  Name:  Vickie Kemp    MRN: [id number] DOB: 05/05/1941  08/09/2021  Ms. Selle was observed post Covid-19 immunization for 15 minutes without incident. She was provided with Vaccine Information Sheet and instruction to access the V-Safe system.   Ms. Sotto was instructed to call 911 with any severe reactions post vaccine: Difficulty breathing  Swelling of face and throat  A fast heartbeat  A bad rash all over body  Dizziness and weakness   Immunizations Administered     Name Date Dose VIS Date Route   Pfizer Covid-19 Vaccine Bivalent Booster 08/09/2021 11:00 AM 0.3 mL 04/13/2021 Intramuscular   Manufacturer: [place]   Lot: [id number]   [place]: [place], PharmD, MBA Clinical Acute Care Pharmacist

## 2021-08-18 ENCOUNTER — Other Ambulatory Visit: Payer: Self-pay

## 2021-08-18 DIAGNOSIS — E1159 Type 2 diabetes mellitus with other circulatory complications: Secondary | ICD-10-CM

## 2021-08-18 DIAGNOSIS — E785 Hyperlipidemia, unspecified: Secondary | ICD-10-CM

## 2021-08-18 MED ORDER — ATORVASTATIN CALCIUM 40 MG PO TABS: 40.0000 mg | ORAL_TABLET | Freq: Every day | ORAL | 0 refills | Status: DC

## 2021-08-22 ENCOUNTER — Other Ambulatory Visit: Payer: Self-pay

## 2021-08-22 DIAGNOSIS — E785 Hyperlipidemia, unspecified: Secondary | ICD-10-CM

## 2021-08-22 DIAGNOSIS — E1159 Type 2 diabetes mellitus with other circulatory complications: Secondary | ICD-10-CM

## 2021-08-22 MED ORDER — ATORVASTATIN CALCIUM 40 MG PO TABS: 40.0000 mg | ORAL_TABLET | Freq: Every day | ORAL | 0 refills | Status: DC

## 2021-08-26 DIAGNOSIS — M5136 Other intervertebral disc degeneration, lumbar region: Secondary | ICD-10-CM | POA: Diagnosis not present

## 2021-08-26 DIAGNOSIS — M47816 Spondylosis without myelopathy or radiculopathy, lumbar region: Secondary | ICD-10-CM | POA: Diagnosis not present

## 2021-08-26 DIAGNOSIS — M48061 Spinal stenosis, lumbar region without neurogenic claudication: Secondary | ICD-10-CM | POA: Diagnosis not present

## 2021-08-26 DIAGNOSIS — M5416 Radiculopathy, lumbar region: Secondary | ICD-10-CM | POA: Diagnosis not present

## 2021-08-30 ENCOUNTER — Ambulatory Visit: Payer: Medicare Other

## 2021-09-06 ENCOUNTER — Other Ambulatory Visit: Payer: Self-pay

## 2021-09-06 ENCOUNTER — Ambulatory Visit (INDEPENDENT_AMBULATORY_CARE_PROVIDER_SITE_OTHER): Payer: Medicare Other | Admitting: Pharmacist

## 2021-09-06 VITALS — BP 136/74

## 2021-09-06 DIAGNOSIS — I25119 Atherosclerotic heart disease of native coronary artery with unspecified angina pectoris: Secondary | ICD-10-CM

## 2021-09-06 DIAGNOSIS — E1159 Type 2 diabetes mellitus with other circulatory complications: Secondary | ICD-10-CM

## 2021-09-06 DIAGNOSIS — I10 Essential (primary) hypertension: Secondary | ICD-10-CM

## 2021-09-06 NOTE — Chronic Care Management (AMB) (Signed)
Chronic Care Management CCM Pharmacy Note  09/06/2021 Name:  Vickie Kemp MRN:  [id number] DOB:  03-24-1941  Summary: - Tolerating regimen well. Glucose well controlled except for after bedtime snacks  Recommendations/Changes made from today's visit: - Extensive dietary counseling - Continue current regimen at this time - Reviewed vaccination recommendations  Subjective: Vickie Kemp is an 81 y.o. year old female who is a primary patient of McLean-Scocuzza, Nino Glow, MD.  The CCM team was consulted for assistance with disease management and care coordination needs.    Engaged with patient face to face for follow up visit for pharmacy case management and/or care coordination services.   Objective:  Medications Reviewed Today     Reviewed by De Hollingshead, RPH-CPP (Pharmacist) on 09/06/21 at 57  Med List Status: <None>   Medication Order Taking? Sig Documenting Provider Last Dose Status Informant  acetaminophen (TYLENOL) 650 MG CR tablet [id number] Yes Take 650 mg by mouth 3 (three) times daily as needed. [provider] Taking Active   amLODipine (NORVASC) 5 MG tablet [id number] Yes Take 1 tablet (5 mg total) by mouth daily. Kate Sable, MD Taking Active   aspirin EC 81 MG tablet [id number] Yes Take 81 mg by mouth daily. [provider] Taking Active   atorvastatin (LIPITOR) 40 MG tablet [id number] Yes Take 1 tablet (40 mg total) by mouth daily. Qhs Kate Sable, MD Taking Active   carvedilol (COREG) 25 MG tablet [id number] Yes Take 1 tablet (25 mg total) by mouth 2 (two) times daily. McLean-Scocuzza, Nino Glow, MD Taking Active   Cholecalciferol (VITAMIN D3 PO) [id number] Yes Take by mouth. [provider] Taking Active   Continuous Blood Gluc Sensor (FREESTYLE LIBRE 2 SENSOR) [place] [id number]  by Does not apply route. [provider]  Active   furosemide (LASIX) 40 MG tablet [id number] Yes Take 1 tablet (40 mg total) by mouth in  the morning and at bedtime. Kate Sable, MD Taking Active            Med Note ([place] Jun 07, 2021  2:01 PM) QAM  insulin degludec (TRESIBA FLEXTOUCH) 200 UNIT/ML FlexTouch Pen [id number] Yes Inject 32 Units into the skin daily. McLean-Scocuzza, Nino Glow, MD Taking Active   insulin lispro (HUMALOG KWIKPEN) 100 UNIT/ML KwikPen [id number] Yes Inject 8 Units into the skin 3 (three) times daily. McLean-Scocuzza, Nino Glow, MD Taking Active   Insulin Pen Needle (PEN NEEDLES) 31G X 8 MM MISC [id number]  1 pen by Does not apply route 2 (two) times daily as needed. McLean-Scocuzza, Nino Glow, MD  Active   Insulin Pen Needle 32G X 4 MM MISC [id number]  1 Device by Does not apply route 4 (four) times daily - after meals and at bedtime. McLean-Scocuzza, Nino Glow, MD  Active   latanoprost (XALATAN) 0.005 % ophthalmic solution [id number]  Place 1 drop into the left eye at bedtime. [provider]  Active   losartan (COZAAR) 100 MG tablet [id number] Yes Take 1 tablet (100 mg total) by mouth daily. McLean-Scocuzza, Nino Glow, MD Taking Active   MAXITROL 3.5-10000-0.1 ophthalmic suspension [id number] Yes Apply 1 drop to eye 3 (three) times daily. [provider] Taking Active   [facility] ULTRA test strip [id number] Yes USE TO CHECK BLOOD SUGAR THREE TIMES DAILY McLean-Scocuzza, Nino Glow, MD Taking Active   Turmeric (QC TUMERIC COMPLEX PO) [id number] Yes Take by mouth.  [provider] Taking Active  Pertinent Labs:   Lab Results  Component Value Date   HGBA1C 8.3 (H) 06/07/2021   Lab Results  Component Value Date   CHOL 91 06/07/2021   HDL 30.10 (L) 06/07/2021   LDLCALC 50 06/07/2021   LDLDIRECT 49.0 06/07/2021   TRIG 56.0 06/07/2021   CHOLHDL 3 06/07/2021   Lab Results  Component Value Date   CREATININE 1.34 (H) 06/07/2021   BUN 18 06/07/2021   NA 143 06/07/2021   K 4.7 06/07/2021   CL 104 06/07/2021   CO2 32 06/07/2021    SDOH:  (Social  Determinants of Health) assessments and interventions performed:  SDOH Interventions    Flowsheet Row Most Recent Value  SDOH Interventions   Financial Strain Interventions Intervention Not Indicated       CCM Care Plan  Review of patient past medical history, allergies, medications, health status, including review of consultants reports, laboratory and other test data, was performed as part of comprehensive evaluation and provision of chronic care management services.   Care Plan : Medication Management  Updates made by De Hollingshead, RPH-CPP since 09/06/2021 12:00 AM     Problem: Diabetes, CAD, PVD, HTN      Long-Range Goal: Disease Progression Prevention   Start Date: 06/18/2020  Expected End Date: 09/18/2020  Recent Progress: On track  Priority: High  Note:   Current Barriers:  Unable to achieve control of diabetes   Pharmacist Clinical Goal(s):  Over the next 90 days, patient will improve control of diabetes through collaboration with PharmD and provider.   Interventions: 1:1 collaboration with McLean-Scocuzza, Nino Glow, MD regarding development and update of comprehensive plan of care as evidenced by provider attestation and co-signature Inter-disciplinary care team collaboration (see longitudinal plan of care) Comprehensive medication review performed; medication list updated in electronic medical record  Health Maintenance   Yearly diabetic eye exam: up to date  Yearly diabetic foot exam: up to date Urine microalbumin: up to date Yearly influenza vaccination: up to date  Td/Tdap vaccination: due - recommended to puruse Pneumonia vaccination: due - discuss with PCP moving forward  COVID vaccinations: up to date  Shingrix vaccinations: due - recommended to pursue  Diabetes: Uncontrolled but improved; current treatment: Tresiba 32 units daily, Humalog 8 units with supper  Renal function limits use of metformin Current glucose readings: using Libre 2 CGM Date  of Download: 1/11-1/24/23 % Time CGM is active: 97% Average Glucose: 170 mg/dL Glucose Management Indicator: 7.4  Glucose Variability: 31.9 (goal <36%) Time in Goal:  - Time in range 70-180: 60% - Time above range: 40% - Time below range: 0% Observed patterns: elevations around 10 pm-12 am. Patient admits to snacks - popcorn, potato chips, candy, ice cream. She denies hunger, denies snacking to prevent lows, reports she snacks because she likes to snack while watching TV Advised to decrease portion sizes of bedtime snacks and eliminate if able Again reviewed benefit of SGLT2. Patient declines to add therapy at this time  Hypertension in the setting of CKD: Controlled in office today; current treatment: amlodipine 5 mg daily QPM, losartan 100 mg QAM, carvedilol 25 mg BID, furosemide 40 mg BID - reports taking once daily Hx edema with amlodipine 10 mg Recommended to continue current regimen at this time Dietary counseling including praising for reducing salt intake  Hyperlipidemia, secondary ASCVD prevention (CAD, MI 2014, PVD) Controlled per last lipid panel; current treatment: atorvastatin 40 mg daily Antiplatelet regimen: aspirin 81 mg daily Previously recommended to continue current regimen  at this time  Patient Goals/Self-Care Activities Over the next 90 days, patient will:  - take medications as prescribed check glucose three times daily using CGM, document, and provide at future appointments      Plan: Face to Face appointment with care management team member scheduled for: 6 weeks  [name], PharmD, [place], [place] Pharmacist [employer] at [facility] [phone]

## 2021-09-06 NOTE — Patient Instructions (Addendum)
Vickie Kemp,   It was great to see you today!  Your highest blood sugars are in the evenings after your snacks. Please think about cutting down on the carbohydrates and portion sizes of these snacks, or cutting them out altogether.   Check blood pressure readings at home 1-2 times weekly, write them down, and bring to your next appointment with Dr. Olivia Kemp.   We recommend the Shingrix (shingles) vaccine series for all over age 80. We also recommend the Tdap vaccine every 10 years. They both should have a $0 copay on all Medicare plans this year. You can pursue this without a prescription at your local pharmacy, or feel free to call our [place] at [facility] at [phone]  Take care!  [name], PharmD  Visit Information  Following are the goals we discussed today:  Patient Goals/Self-Care Activities Over the next 90 days, patient will:  - take medications as prescribed check glucose three times daily using CGM, document, and provide at future appointments        Plan: Face to Face appointment with care management team member scheduled for: 6 weeks   [name], PharmD, [place], CPP Clinical Pharmacist [place] at [facility] [phone]   Please call the care guide team at [phone] if you need to cancel or reschedule your appointment.   Print copy of patient instructions, educational materials, and care plan provided in person.

## 2021-09-07 ENCOUNTER — Encounter: Payer: Self-pay | Admitting: Internal Medicine

## 2021-09-07 NOTE — Telephone Encounter (Signed)
For your information  

## 2021-09-08 ENCOUNTER — Telehealth: Payer: Self-pay | Admitting: Pharmacist

## 2021-09-08 DIAGNOSIS — E1159 Type 2 diabetes mellitus with other circulatory complications: Secondary | ICD-10-CM

## 2021-09-08 MED ORDER — FREESTYLE LIBRE 2 SENSOR MISC: 1.0000 | 3 refills | Status: DC

## 2021-09-08 NOTE — Telephone Encounter (Signed)
Received [facility] fax from [facility] for CGM refill. CGM is now covered at the local pharmacy on [address]. Communicated this with patient. Sending order for Echo 2 sensors to [facility].

## 2021-09-08 NOTE — Telephone Encounter (Signed)
Noted thank you

## 2021-09-13 DIAGNOSIS — E1159 Type 2 diabetes mellitus with other circulatory complications: Secondary | ICD-10-CM | POA: Diagnosis not present

## 2021-09-13 DIAGNOSIS — I25119 Atherosclerotic heart disease of native coronary artery with unspecified angina pectoris: Secondary | ICD-10-CM

## 2021-09-13 DIAGNOSIS — I10 Essential (primary) hypertension: Secondary | ICD-10-CM

## 2021-09-21 ENCOUNTER — Telehealth: Payer: Self-pay | Admitting: Internal Medicine

## 2021-09-21 NOTE — Telephone Encounter (Signed)
Patient has a lab appt 09/27/2021, there are no orders in.

## 2021-09-22 ENCOUNTER — Other Ambulatory Visit: Payer: Self-pay | Admitting: Internal Medicine

## 2021-09-22 DIAGNOSIS — I152 Hypertension secondary to endocrine disorders: Secondary | ICD-10-CM

## 2021-09-22 DIAGNOSIS — Z1329 Encounter for screening for other suspected endocrine disorder: Secondary | ICD-10-CM

## 2021-09-22 DIAGNOSIS — E1159 Type 2 diabetes mellitus with other circulatory complications: Secondary | ICD-10-CM

## 2021-09-27 ENCOUNTER — Other Ambulatory Visit: Payer: Medicare Other

## 2021-09-27 ENCOUNTER — Other Ambulatory Visit: Payer: Self-pay

## 2021-09-27 ENCOUNTER — Other Ambulatory Visit (INDEPENDENT_AMBULATORY_CARE_PROVIDER_SITE_OTHER): Payer: Medicare Other

## 2021-09-27 DIAGNOSIS — I152 Hypertension secondary to endocrine disorders: Secondary | ICD-10-CM | POA: Diagnosis not present

## 2021-09-27 DIAGNOSIS — I10 Essential (primary) hypertension: Secondary | ICD-10-CM

## 2021-09-27 DIAGNOSIS — E1159 Type 2 diabetes mellitus with other circulatory complications: Secondary | ICD-10-CM | POA: Diagnosis not present

## 2021-09-27 DIAGNOSIS — Z1329 Encounter for screening for other suspected endocrine disorder: Secondary | ICD-10-CM | POA: Diagnosis not present

## 2021-09-27 LAB — CBC WITH DIFFERENTIAL/PLATELET
Basophils Absolute: 0.1 10*3/uL (ref 0.0–0.1)
Basophils Relative: 0.6 % (ref 0.0–3.0)
Eosinophils Absolute: 0.2 10*3/uL (ref 0.0–0.7)
Eosinophils Relative: 2.5 % (ref 0.0–5.0)
HCT: 37.6 % (ref 36.0–46.0)
Hemoglobin: 12.1 g/dL (ref 12.0–15.0)
Lymphocytes Relative: 26 % (ref 12.0–46.0)
Lymphs Abs: 2.3 10*3/uL (ref 0.7–4.0)
MCHC: 32.2 g/dL (ref 30.0–36.0)
MCV: 88.6 fl (ref 78.0–100.0)
Monocytes Absolute: 0.7 10*3/uL (ref 0.1–1.0)
Monocytes Relative: 7.8 % (ref 3.0–12.0)
Neutro Abs: 5.5 10*3/uL (ref 1.4–7.7)
Neutrophils Relative %: 63.1 % (ref 43.0–77.0)
Platelets: 204 10*3/uL (ref 150.0–400.0)
RBC: 4.24 Mil/uL (ref 3.87–5.11)
RDW: 14.6 % (ref 11.5–15.5)
WBC: 8.8 10*3/uL (ref 4.0–10.5)

## 2021-09-27 LAB — HEMOGLOBIN A1C: Hgb A1c MFr Bld: 8.5 % — ABNORMAL HIGH (ref 4.6–6.5)

## 2021-09-27 LAB — TSH: TSH: 1.59 u[IU]/mL (ref 0.35–5.50)

## 2021-09-28 LAB — COMPREHENSIVE METABOLIC PANEL
ALT: 7 U/L (ref 0–35)
AST: 14 U/L (ref 0–37)
Albumin: 4.2 g/dL (ref 3.5–5.2)
Alkaline Phosphatase: 90 U/L (ref 39–117)
BUN: 19 mg/dL (ref 6–23)
CO2: 32 mEq/L (ref 19–32)
Calcium: 9.6 mg/dL (ref 8.4–10.5)
Chloride: 103 mEq/L (ref 96–112)
Creatinine, Ser: 1.37 mg/dL — ABNORMAL HIGH (ref 0.40–1.20)
GFR: 36.64 mL/min — ABNORMAL LOW (ref >60.00)
Glucose, Bld: 86 mg/dL (ref 70–99)
Potassium: 4.1 mEq/L (ref 3.5–5.1)
Sodium: 144 mEq/L (ref 135–145)
Total Bilirubin: 0.5 mg/dL (ref 0.2–1.2)
Total Protein: 7.3 g/dL (ref 6.0–8.3)

## 2021-09-28 LAB — LIPID PANEL
Cholesterol: 98 mg/dL (ref 0–200)
HDL: 31.1 mg/dL — ABNORMAL LOW (ref >39.00)
LDL Cholesterol: 58 mg/dL (ref 0–99)
NonHDL: 66.65
Total CHOL/HDL Ratio: 3
Triglycerides: 45 mg/dL (ref 0.0–149.0)
VLDL: 9 mg/dL (ref 0.0–40.0)

## 2021-09-29 ENCOUNTER — Ambulatory Visit: Payer: Medicare Other | Admitting: Internal Medicine

## 2021-09-29 LAB — MICROALBUMIN / CREATININE URINE RATIO

## 2021-10-03 ENCOUNTER — Ambulatory Visit: Payer: Medicare Other | Admitting: Cardiology

## 2021-10-03 ENCOUNTER — Other Ambulatory Visit: Payer: Self-pay

## 2021-10-03 ENCOUNTER — Encounter: Payer: Self-pay | Admitting: Cardiology

## 2021-10-03 ENCOUNTER — Encounter: Payer: Self-pay | Admitting: Internal Medicine

## 2021-10-03 VITALS — BP 140/82 | HR 62 | Ht 64.0 in | Wt 190.0 lb

## 2021-10-03 DIAGNOSIS — I2510 Atherosclerotic heart disease of native coronary artery without angina pectoris: Secondary | ICD-10-CM | POA: Diagnosis not present

## 2021-10-03 DIAGNOSIS — I5189 Other ill-defined heart diseases: Secondary | ICD-10-CM | POA: Diagnosis not present

## 2021-10-03 DIAGNOSIS — I272 Pulmonary hypertension, unspecified: Secondary | ICD-10-CM | POA: Diagnosis not present

## 2021-10-03 DIAGNOSIS — I10 Essential (primary) hypertension: Secondary | ICD-10-CM | POA: Diagnosis not present

## 2021-10-03 NOTE — Progress Notes (Signed)
Cardiology Office Note:    Date:  10/03/2021   ID:  Arial Galligan, DOB 04-Feb-1941, MRN [id number]  PCP:  McLean-Scocuzza, Nino Glow, MD  Cardiologist:  Kate Sable, MD  Electrophysiologist:  None   Referring MD: McLean-Scocuzza, Olivia Mackie *   Chief Complaint  Patient presents with   Other    6 month follow up -- patient c/o swelling in ankles and SOB when walking long distance. Meds reviewed verbally with patient.     History of Present Illness:    Vickie Kemp is a 81 y.o. female with a hx of HFpEF, hypertension, CKD, OSA, obesity, CAD/MI (status post DES to mLAD 04/2013 at [facility]), pulmonary hypertension who presents for follow-up.  Patient is being seen for CAD and pulm htn. She states having sob and edema. Not compliant with cpap mask as prescribed. Had an episode of chestpain after sister passed, but has not had any discomfort since. Recent pulm function testing showed possible restrictive lung disease.    Prior notes Echocardiogram on 04/6221 normal systolic function, EF 55 to 97%, grade 2 diastolic dysfunction, severely elevated pulmonary artery systolic pressure, 98.9 mmHg Per records, angiogram in 2014 showed 90% mid LAD lesion, 80% OM, 60% RCA s/p DES to mid LAD 2014.     Past Medical History:  Diagnosis Date   Chronic kidney disease    Coronary artery disease    MI, pci to mid LAD 2014. LHC 80%OM, 60%RCA, 90% LAD   Diabetes mellitus without complication (HCC)    Hypertension    Sleep apnea     Past Surgical History:  Procedure Laterality Date   ACNE CYST REMOVAL  2008   Groin area   CARDIAC CATHETERIZATION     COLONOSCOPY WITH PROPOFOL N/A 05/27/2020   Procedure: COLONOSCOPY WITH PROPOFOL;  Surgeon: [name], MD;  Location: [facility] ENDOSCOPY;  Service: Gastroenterology;  Laterality: N/A;   CORONARY ANGIOPLASTY      Current Medications: Current Meds  Medication Sig   acetaminophen (TYLENOL) 650 MG CR  tablet Take 650 mg by mouth 3 (three) times daily as needed.   amLODipine (NORVASC) 5 MG tablet Take 1 tablet (5 mg total) by mouth daily.   aspirin EC 81 MG tablet Take 81 mg by mouth daily.   atorvastatin (LIPITOR) 40 MG tablet Take 1 tablet (40 mg total) by mouth daily. Qhs   carvedilol (COREG) 25 MG tablet Take 1 tablet (25 mg total) by mouth 2 (two) times daily.   Cholecalciferol (VITAMIN D3 PO) Take by mouth.   Continuous Blood Gluc Sensor (FREESTYLE LIBRE 2 SENSOR) MISC Apply 1 each topically every 14 (fourteen) days.   furosemide (LASIX) 40 MG tablet Take 1 tablet (40 mg total) by mouth in the morning and at bedtime.   insulin degludec (TRESIBA FLEXTOUCH) 200 UNIT/ML FlexTouch Pen Inject 32 Units into the skin daily.   insulin lispro (HUMALOG KWIKPEN) 100 UNIT/ML KwikPen Inject 8 Units into the skin 3 (three) times daily.   Insulin Pen Needle 32G X 4 MM MISC 1 Device by Does not apply route 4 (four) times daily - after meals and at bedtime.   latanoprost (XALATAN) 0.005 % ophthalmic solution Place 1 drop into the left eye at bedtime.   losartan (COZAAR) 100 MG tablet Take 1 tablet (100 mg total) by mouth daily.   MAXITROL 3.5-10000-0.1 ophthalmic suspension Apply 1 drop to eye 3 (three) times daily.   ONETOUCH ULTRA test strip USE TO CHECK BLOOD SUGAR  THREE TIMES DAILY   Turmeric (QC TUMERIC COMPLEX PO) Take by mouth.      Allergies:   Amoxicillin   Social History   Socioeconomic History   Marital status: Single    Spouse name: Not on file   Number of children: Not on file   Years of education: Not on file   Highest education level: Not on file  Occupational History   Not on file  Tobacco Use   Smoking status: Never   Smokeless tobacco: Never  Vaping Use   Vaping Use: Never used  Substance and Sexual Activity   Alcohol use: Never   Drug use: Never   Sexual activity: Not on file  Other Topics Concern   Not on file  Social History Narrative   Used to live in [place]    Social Determinants of Health   Financial Resource Strain: Low Risk    Difficulty of Paying Living Expenses: Not hard at all  Food Insecurity: No Food Insecurity   Worried About Charity fundraiser in the Last Year: Never true   Arboriculturist in the Last Year: Never true  Transportation Needs: No Transportation Needs   Lack of Transportation (Medical): No   Lack of Transportation (Non-Medical): No  Physical Activity: Not on file  Stress: No Stress Concern Present   Feeling of Stress : Not at all  Social Connections: Unknown   Frequency of Communication with Friends and Family: More than three times a week   Frequency of Social Gatherings with Friends and Family: More than three times a week   Attends Religious Services: More than 4 times per year   Active Member of [employer] or Organizations: Yes   Attends Archivist Meetings: Not on file   Marital Status: Not on file     Family History: The patient's family history includes Diabetes in her mother; Heart disease in her father. There is no history of Breast cancer.  ROS:   Please see the history of present illness.     All other systems reviewed and are negative.  EKGs/Labs/Other Studies Reviewed:    The following studies were reviewed today:  EKG:  EKG is ordered today. Normal sinus rhythm, first-degree AV block.  Recent Labs: 09/27/2021: ALT 7; BUN 19; Creatinine, Ser 1.37; Hemoglobin 12.1; Platelets 204.0; Potassium 4.1; Sodium 144; TSH 1.59  Recent Lipid Panel    Component Value Date/Time   CHOL 98 09/27/2021 0941   TRIG 45.0 09/27/2021 0941   HDL 31.10 (L) 09/27/2021 0941   CHOLHDL 3 09/27/2021 0941   VLDL 9.0 09/27/2021 0941   LDLCALC 58 09/27/2021 0941   LDLDIRECT 49.0 06/07/2021 1440    Physical Exam:    VS:  BP 140/82 (BP Location: Right Arm, Patient Position: Sitting, Cuff Size: Normal)    Pulse 62    Ht 5\' 4"  (1.626 m)    Wt 190 lb (86.2 kg)    LMP  (LMP Unknown)    SpO2 97%     BMI 32.61 kg/m     Wt Readings from Last 3 Encounters:  10/03/21 190 lb (86.2 kg)  06/21/21 201 lb (91.2 kg)  06/07/21 202 lb (91.6 kg)     GEN:  Well nourished, well developed in no acute distress HEENT: Normal NECK: No JVD; No carotid bruits LYMPHATICS: No lymphadenopathy CARDIAC: RRR, no murmurs, rubs, gallops RESPIRATORY: Diminished breath sounds at bases ABDOMEN: Soft, non-tender, non-distended MUSCULOSKELETAL: 1+ pitting  edema; No deformity  SKIN: Warm and dry NEUROLOGIC:  Alert and oriented x 3 PSYCHIATRIC:  Normal affect   ASSESSMENT:    1. Diastolic dysfunction   2. Pulmonary HTN ([place])   3. Coronary artery disease involving native coronary artery of native heart without angina pectoris   4. Hypertension, unspecified type    PLAN:    Grade 2 diastolic dysfunction, cont lasix 40mg  qd. Etiology multifactoria, including cad, overweight, OSA. History of severe pulmonary hypertension with PASP of 76mmHg.  Etiology  likely multifactorial (OSA-hypoxia pathway, diastolic dysfunction (LVDD-PH)). CPAP compliance for OSA advised, Lasix for diastolic dysfunction as above. Patient previously declined [address].  May have ILD as per recent pulmonary function test.  Recommend referral to pulm htn clinic CAD status post PCI to mid LAD 2014.  Denies chest pain continue aspirin, Lipitor. hypertension.  Continue Coreg, losartan, lisinopril.  Follow-up in 6 months  Total encounter time 40 minutes  Greater than 50% was spent in counseling and coordination of care with the patient   Medication Adjustments/Labs and Tests Ordered: Current medicines are reviewed at length with the patient today.  Concerns regarding medicines are outlined above.  Orders Placed This Encounter  Procedures   AMB referral to CHF clinic   EKG 12-Lead    No orders of the defined types were placed in this encounter.    Patient Instructions  Medication Instructions:  - Your physician recommends that you  continue on your current medications as directed. Please refer to the Current Medication list given to you today.  *If you need a refill on your cardiac medications before your next appointment, please call your pharmacy*   Lab Work: - none ordered  If you have labs (blood work) drawn today and your tests are completely normal, you will receive your results only by: [place] (if you have MyChart) OR A paper copy in the mail If you have any lab test that is abnormal or we need to change your treatment, we will call you to review the results.   Testing/Procedures: - You have been referred to : Pulmonary Hypertension Clinic in [place] (Dr. Bensimhon/ Dr. Aundra Dubin)  Their clinic will be in touch with you directly to schedule an appointment. If you have not heard from their office in the next 2 weeks, please call this directly at [phone] to follow up .    Follow-Up: At [facility], you and your health needs are our priority.  As part of our continuing mission to provide you with exceptional heart care, we have created designated Provider Care Teams.  These Care Teams include your primary Cardiologist (physician) and Advanced Practice Providers (APPs -  Physician Assistants and Nurse Practitioners) who all work together to provide you with the care you need, when you need it.  We recommend signing up for the patient portal called "MyChart".  Sign up information is provided on this After Visit Summary.  MyChart is used to connect with patients for Virtual Visits (Telemedicine).  Patients are able to view lab/test results, encounter notes, upcoming appointments, etc.  Non-urgent messages can be sent to your provider as well.   To learn more about what you can do with MyChart, go to [email or web address].    Your next appointment:   6 month(s)  The format for your next appointment:   In Person  Provider:   Kate Sable, MD    Other Instructions N/a     Signed, Kate Sable, MD  10/03/2021 4:20 PM  [facility]

## 2021-10-03 NOTE — Patient Instructions (Signed)
Medication Instructions:  - Your physician recommends that you continue on your current medications as directed. Please refer to the Current Medication list given to you today.  *If you need a refill on your cardiac medications before your next appointment, please call your pharmacy*   Lab Work: - none ordered  If you have labs (blood work) drawn today and your tests are completely normal, you will receive your results only by: [place] (if you have MyChart) OR A paper copy in the mail If you have any lab test that is abnormal or we need to change your treatment, we will call you to review the results.   Testing/Procedures: - You have been referred to : Pulmonary Hypertension Clinic in [place] (Dr. Bensimhon/ Dr. Aundra Dubin)  Their clinic will be in touch with you directly to schedule an appointment. If you have not heard from their office in the next 2 weeks, please call this directly at [phone] to follow up .    Follow-Up: At [facility], you and your health needs are our priority.  As part of our continuing mission to provide you with exceptional heart care, we have created designated Provider Care Teams.  These Care Teams include your primary Cardiologist (physician) and Advanced Practice Providers (APPs -  Physician Assistants and Nurse Practitioners) who all work together to provide you with the care you need, when you need it.  We recommend signing up for the patient portal called "MyChart".  Sign up information is provided on this After Visit Summary.  MyChart is used to connect with patients for Virtual Visits (Telemedicine).  Patients are able to view lab/test results, encounter notes, upcoming appointments, etc.  Non-urgent messages can be sent to your provider as well.   To learn more about what you can do with MyChart, go to [email or web address].    Your next appointment:   6 month(s)  The format for your next appointment:   In Person  Provider:    Kate Sable, MD    Other Instructions N/a

## 2021-10-04 ENCOUNTER — Ambulatory Visit (INDEPENDENT_AMBULATORY_CARE_PROVIDER_SITE_OTHER): Payer: Medicare Other | Admitting: Internal Medicine

## 2021-10-04 ENCOUNTER — Other Ambulatory Visit: Payer: Self-pay

## 2021-10-04 ENCOUNTER — Encounter: Payer: Self-pay | Admitting: Internal Medicine

## 2021-10-04 VITALS — BP 132/80 | HR 67 | Temp 98.2°F | Resp 16 | Ht 64.0 in | Wt 199.2 lb

## 2021-10-04 DIAGNOSIS — M542 Cervicalgia: Secondary | ICD-10-CM

## 2021-10-04 DIAGNOSIS — N184 Chronic kidney disease, stage 4 (severe): Secondary | ICD-10-CM | POA: Diagnosis not present

## 2021-10-04 DIAGNOSIS — I152 Hypertension secondary to endocrine disorders: Secondary | ICD-10-CM

## 2021-10-04 DIAGNOSIS — N3281 Overactive bladder: Secondary | ICD-10-CM | POA: Diagnosis not present

## 2021-10-04 DIAGNOSIS — Z1231 Encounter for screening mammogram for malignant neoplasm of breast: Secondary | ICD-10-CM | POA: Diagnosis not present

## 2021-10-04 DIAGNOSIS — Z23 Encounter for immunization: Secondary | ICD-10-CM | POA: Diagnosis not present

## 2021-10-04 DIAGNOSIS — E1159 Type 2 diabetes mellitus with other circulatory complications: Secondary | ICD-10-CM | POA: Diagnosis not present

## 2021-10-04 DIAGNOSIS — N1832 Chronic kidney disease, stage 3b: Secondary | ICD-10-CM

## 2021-10-04 DIAGNOSIS — H9201 Otalgia, right ear: Secondary | ICD-10-CM | POA: Diagnosis not present

## 2021-10-04 DIAGNOSIS — R159 Full incontinence of feces: Secondary | ICD-10-CM

## 2021-10-04 NOTE — Telephone Encounter (Signed)
For your information  

## 2021-10-04 NOTE — Progress Notes (Signed)
Chief Complaint  Patient presents with   Follow-up    3 month, discuss about being referred to Endo, discuss about bladder/bowel leakage has been ongoing for months states it occurs while she speaks or coughs and has hard time controlling. R ear discomfort radiates from lower to upper ear. She thinks it may be due to her sleeping on the side.    F/u  1. Dm 2 uncontrolled 09/27/21 A1c 8.5 on tresiba 32 u qd and humalog 8 u tid  Wants to see endocrine Dr. Ronnald Collum 2. C/o posterior right ear pain she is not sure if due to sleeping on her side she does have dental issues and dentist appt 10/27/21 3. C/o overactive bladder at night Q2-3 hours and urinates during the day frequently while awake and now having trouble controlling bowels when urinating and leakage of both with coughing and sneezing h/o abnormal MRI low back severe arthropathy    IMPRESSION: Multilevel degenerative changes as detailed above. Decreased disc protrusion at L4-L5 with decreased effacement of the subarticular recess. Left foraminal narrowing remains greatest at this level. Marked facet arthropathy at L4-L5 and L5-S1.     Electronically Signed   By: Macy Mis M.D.   On: 07/06/2021 16:05  Review of Systems  HENT:  Positive for ear pain.   Gastrointestinal:        +stool incontinence   Genitourinary:  Positive for frequency.  Musculoskeletal:  Positive for back pain.  Past Medical History:  Diagnosis Date   Chronic kidney disease    Coronary artery disease    MI, pci to mid LAD 2014. LHC 80%OM, 60%RCA, 90% LAD   Diabetes mellitus without complication (HCC)    Hypertension    Sleep apnea    Past Surgical History:  Procedure Laterality Date   ACNE CYST REMOVAL  2008   Groin area   CARDIAC CATHETERIZATION     COLONOSCOPY WITH PROPOFOL N/A 05/27/2020   Procedure: COLONOSCOPY WITH PROPOFOL;  Surgeon: [name], MD;  Location: [facility] ENDOSCOPY;  Service: Gastroenterology;  Laterality: N/A;   CORONARY  ANGIOPLASTY     Family History  Problem Relation Age of Onset   Diabetes Mother    Heart disease Father    Breast cancer Neg Hx    Social History   Socioeconomic History   Marital status: Single    Spouse name: Not on file   Number of children: Not on file   Years of education: Not on file   Highest education level: Not on file  Occupational History   Not on file  Tobacco Use   Smoking status: Never   Smokeless tobacco: Never  Vaping Use   Vaping Use: Never used  Substance and Sexual Activity   Alcohol use: Never   Drug use: Never   Sexual activity: Not on file  Other Topics Concern   Not on file  Social History Narrative   Used to live in [place]    Social Determinants of Health   Financial Resource Strain: Low Risk    Difficulty of Paying Living Expenses: Not hard at all  Food Insecurity: No Food Insecurity   Worried About Charity fundraiser in the Last Year: Never true   Arboriculturist in the Last Year: Never true  Transportation Needs: No Transportation Needs   Lack of Transportation (Medical): No   Lack of Transportation (Non-Medical): No  Physical Activity: Not on file  Stress: No Stress Concern  Present   Feeling of Stress : Not at all  Social Connections: Unknown   Frequency of Communication with Friends and Family: More than three times a week   Frequency of Social Gatherings with Friends and Family: More than three times a week   Attends Religious Services: More than 4 times per year   Active Member of [employer] or Organizations: Yes   Attends Music therapist: Not on file   Marital Status: Not on file  Intimate Partner Violence: Not At Risk   Fear of Current or Ex-Partner: No   Emotionally Abused: No   Physically Abused: No   Sexually Abused: No   Current Meds  Medication Sig   acetaminophen (TYLENOL) 650 MG CR tablet Take 650 mg by mouth 3 (three) times daily as needed.   amLODipine (NORVASC) 5 MG tablet Take 1 tablet  (5 mg total) by mouth daily.   aspirin EC 81 MG tablet Take 81 mg by mouth daily.   atorvastatin (LIPITOR) 40 MG tablet Take 1 tablet (40 mg total) by mouth daily. Qhs   carvedilol (COREG) 25 MG tablet Take 1 tablet (25 mg total) by mouth 2 (two) times daily.   Cholecalciferol (VITAMIN D3 PO) Take by mouth.   Continuous Blood Gluc Sensor (FREESTYLE LIBRE 2 SENSOR) MISC Apply 1 each topically every 14 (fourteen) days.   furosemide (LASIX) 40 MG tablet Take 1 tablet (40 mg total) by mouth in the morning and at bedtime.   insulin degludec (TRESIBA FLEXTOUCH) 200 UNIT/ML FlexTouch Pen Inject 32 Units into the skin daily.   insulin lispro (HUMALOG KWIKPEN) 100 UNIT/ML KwikPen Inject 8 Units into the skin 3 (three) times daily.   Insulin Pen Needle (PEN NEEDLES) 31G X 8 MM MISC 1 pen by Does not apply route 2 (two) times daily as needed.   Insulin Pen Needle 32G X 4 MM MISC 1 Device by Does not apply route 4 (four) times daily - after meals and at bedtime.   latanoprost (XALATAN) 0.005 % ophthalmic solution Place 1 drop into the left eye at bedtime.   losartan (COZAAR) 100 MG tablet Take 1 tablet (100 mg total) by mouth daily.   MAXITROL 3.5-10000-0.1 ophthalmic suspension Apply 1 drop to eye 3 (three) times daily.   ONETOUCH ULTRA test strip USE TO CHECK BLOOD SUGAR THREE TIMES DAILY   Turmeric (QC TUMERIC COMPLEX PO) Take by mouth.    Allergies  Allergen Reactions   Amoxicillin Itching   Recent Results (from the past 2160 hour(s))  Microalbumin / creatinine urine ratio     Status: None   Collection Time: 09/27/21  9:41 AM  Result Value Ref Range   Creatinine, Urine CANCELED     Comment: TEST NOT PERFORMED . Quantity not sufficient.  Result canceled by the ancillary.   TSH     Status: None   Collection Time: 09/27/21  9:41 AM  Result Value Ref Range   TSH 1.59 0.35 - 5.50 uIU/mL  Hemoglobin A1c     Status: Abnormal   Collection Time: 09/27/21  9:41 AM  Result Value Ref Range   Hgb  A1c MFr Bld 8.5 (H) 4.6 - 6.5 %    Comment: Glycemic Control Guidelines for People with Diabetes:Non Diabetic:  <6%Goal of Therapy: <7%Additional Action Suggested:  >8%   CBC with Differential/Platelet     Status: None   Collection Time: 09/27/21  9:41 AM  Result Value Ref Range   WBC 8.8 4.0 - 10.5 K/uL  RBC 4.24 3.87 - 5.11 Mil/uL   Hemoglobin 12.1 12.0 - 15.0 g/dL   HCT 37.6 36.0 - 46.0 %   MCV 88.6 78.0 - 100.0 fl   MCHC 32.2 30.0 - 36.0 g/dL   RDW 14.6 11.5 - 15.5 %   Platelets 204.0 150.0 - 400.0 K/uL   Neutrophils Relative % 63.1 43.0 - 77.0 %   Lymphocytes Relative 26.0 12.0 - 46.0 %   Monocytes Relative 7.8 3.0 - 12.0 %   Eosinophils Relative 2.5 0.0 - 5.0 %   Basophils Relative 0.6 0.0 - 3.0 %   Neutro Abs 5.5 1.4 - 7.7 K/uL   Lymphs Abs 2.3 0.7 - 4.0 K/uL   Monocytes Absolute 0.7 0.1 - 1.0 K/uL   Eosinophils Absolute 0.2 0.0 - 0.7 K/uL   Basophils Absolute 0.1 0.0 - 0.1 K/uL  Lipid panel     Status: Abnormal   Collection Time: 09/27/21  9:41 AM  Result Value Ref Range   Cholesterol 98 0 - 200 mg/dL    Comment: ATP III Classification       Desirable:  < 200 mg/dL               Borderline High:  200 - 239 mg/dL          High:  > = 240 mg/dL   Triglycerides 45.0 0.0 - 149.0 mg/dL    Comment: Normal:  <150 mg/dLBorderline High:  150 - 199 mg/dL   HDL 31.10 (L) >39.00 mg/dL   VLDL 9.0 0.0 - 40.0 mg/dL   LDL Cholesterol 58 0 - 99 mg/dL   Total CHOL/HDL Ratio 3     Comment:                Men          Women1/2 Average Risk     3.4          3.3Average Risk          5.0          4.42X Average Risk          9.6          7.13X Average Risk          15.0          11.0                       NonHDL 66.65     Comment: NOTE:  Non-HDL goal should be 30 mg/dL higher than patient's LDL goal (i.e. LDL goal of < 70 mg/dL, would have non-HDL goal of < 100 mg/dL)  Comprehensive metabolic panel     Status: Abnormal   Collection Time: 09/27/21  9:41 AM  Result Value Ref Range   Sodium 144  135 - 145 mEq/L   Potassium 4.1 3.5 - 5.1 mEq/L   Chloride 103 96 - 112 mEq/L   CO2 32 19 - 32 mEq/L   Glucose, Bld 86 70 - 99 mg/dL   BUN 19 6 - 23 mg/dL   Creatinine, Ser 1.37 (H) 0.40 - 1.20 mg/dL   Total Bilirubin 0.5 0.2 - 1.2 mg/dL   Alkaline Phosphatase 90 39 - 117 U/L   AST 14 0 - 37 U/L   ALT 7 0 - 35 U/L   Total Protein 7.3 6.0 - 8.3 g/dL   Albumin 4.2 3.5 - 5.2 g/dL   GFR 36.64 (L) >60.00 mL/min    Comment: Calculated using the CKD-EPI  Creatinine Equation (2021)   Calcium 9.6 8.4 - 10.5 mg/dL   Objective  Body mass index is 34.19 kg/m. Wt Readings from Last 3 Encounters:  10/04/21 199 lb 3.2 oz (90.4 kg)  10/03/21 190 lb (86.2 kg)  06/21/21 201 lb (91.2 kg)   Temp Readings from Last 3 Encounters:  10/04/21 98.2 F (36.8 C) (Oral)  02/23/21 98.7 F (37.1 C) (Oral)  02/17/21 97.8 F (36.6 C) (Oral)   BP Readings from Last 3 Encounters:  10/04/21 132/80  10/03/21 140/82  09/06/21 136/74   Pulse Readings from Last 3 Encounters:  10/04/21 67  10/03/21 62  06/21/21 74    Physical Exam Vitals and nursing note reviewed.  Constitutional:      Appearance: Normal appearance. She is well-developed and well-groomed.  HENT:     Head: Normocephalic and atraumatic.  Eyes:     Conjunctiva/sclera: Conjunctivae normal.     Pupils: Pupils are equal, round, and reactive to light.  Cardiovascular:     Rate and Rhythm: Normal rate and regular rhythm.     Heart sounds: Normal heart sounds. No murmur heard. Pulmonary:     Effort: Pulmonary effort is normal.     Breath sounds: Normal breath sounds.  Abdominal:     General: Abdomen is flat. Bowel sounds are normal.     Tenderness: There is no abdominal tenderness.  Musculoskeletal:        General: No tenderness.  Skin:    General: Skin is warm and dry.  Neurological:     General: No focal deficit present.     Mental Status: She is alert and oriented to person, place, and time. Mental status is at baseline.      Cranial Nerves: Cranial nerves 2-12 are intact.     Motor: Motor function is intact.     Coordination: Coordination is intact.     Gait: Gait abnormal.  Psychiatric:        Attention and Perception: Attention and perception normal.        Mood and Affect: Mood and affect normal.        Speech: Speech normal.        Behavior: Behavior normal. Behavior is cooperative.        Thought Content: Thought content normal.        Cognition and Memory: Cognition and memory normal.        Judgment: Judgment normal.    Assessment  Plan  Type 2 diabetes mellitus with vascular disease ([place]) - Plan: Ambulatory referral to Endocrinology, Microalbumin / creatinine urine ratio Tresiba 32 units and humalog 8 u tid   Stage 3b chronic kidney disease ([place]) - Plan: Ambulatory referral to Endocrinology  CKD (chronic kidney disease) stage 4, GFR 15-29 ml/min ([place]) - Plan: Ambulatory referral to Endocrinology, Microalbumin / creatinine urine ratio,  F/u renal as sch  Hypertension associated with diabetes ([place]) - Plan: Ambulatory referral to Endocrinology, Microalbumin / creatinine urine ratio On norvasc 5 mg qd and lasix 40 mg and losartan 100 mg qd and coreg 25 mg bid,    Overactive bladder - Plan: Ambulatory referral to Physical Therapy, Urinalysis, Routine w reflex microscopic, Urine Culture,   Incontinence of feces, unspecified fecal incontinence type - Plan: Ambulatory referral to Physical Therapy Consider prn immodium   Need for pneumococcal vaccine - Plan: Pneumococcal conjugate vaccine 13-valent IM given today  Right ear pain ? lymphadenopathy Neck pain on right side  Fu dental 10/27/21  Consider US neck will  call back when ready   HM Flu shot utd pna 23 utd  Prevnar given today consider tdap, shingrix vaccine in the future given Rx today covid 19 vx utd 5/5    Hep C neg 06/10/19 Carotid artery plaque b/l DEXA 11/20/16 osteopenia Out of pap window Colonoscopy sch 05/27/20 repeat due in  5 years Dr. Marius Ditch Mammogram 10/26/16 negative, 11/18/20 negative ordered   Never smoker    07/08/16 lumbar mild L4/5 mild disc narrowing Grade I anterolisthesis L4 and L5 with associated disc space narrowing without evidence of fracture. Arthritis 08/12/14 degenerative changes CT scan 07/24/16 negative ABI right 0.64, left 0.62 moderate 05/30/17 moderate PVD 05/25/17 left maxillary sinus disease    Specialists [place] [facility] Renal C[facility] Cards [facility]      Provider: Dr. Olivia Mackie McLean-Scocuzza-Internal Medicine

## 2021-10-04 NOTE — Patient Instructions (Addendum)
Hipolito Bayley, MD Family practice physician in [place], [place] Get online care: [email or web address] Address: [address], [place], [place] [address] Hours:  Open ? Closes 4:30?PM Products and Services: [email or web address] Phone: [phone] Appointments: [email or web address]   Immodium over the counter as needed for loose stool  Probiotic over the counter Align or Culturelle    Jerl Mina, PT, DPT, E-RYT - Physical Therapy pelvic PT Address: [facility], [address], [place], [place] [address] Hours:  Open ? Closes 6:30?PM Confirmed by this business 9 weeks ago Phone: [phone]  Pneumococcal Conjugate Vaccine (Prevnar 13) Suspension for Injection What is this medication? PNEUMOCOCCAL VACCINE (NEU mo KOK al vak SEEN) is a vaccine used to prevent pneumococcus bacterial infections. These bacteria can cause serious infections like pneumonia, meningitis, and blood infections. This vaccine will lower your chance of getting pneumonia. If you do get pneumonia, it can make your symptoms milder and your illness shorter. This vaccine will not treat an infection and will not cause infection. This vaccine is recommended for infants and young children, adults with certain medical conditions, and adults 50 years or older. This medicine may be used for other purposes; ask your health care provider or pharmacist if you have questions. COMMON BRAND NAME(S): Prevnar, Prevnar 13 What should I tell my care team before I take this medication? They need to know if you have any of these conditions: bleeding problems fever immune system problems an unusual or allergic reaction to pneumococcal vaccine, diphtheria toxoid, other vaccines, latex, other medicines, foods, dyes, or preservatives pregnant or trying to get pregnant breast-feeding How should I use this medication? This vaccine is for injection into a muscle. It is given by a health care professional. A copy of  Vaccine Information Statements will be given before each vaccination. Read this sheet carefully each time. The sheet may change frequently. Talk to your pediatrician regarding the use of this medicine in children. While this drug may be prescribed for children as young as 8 weeks old for selected conditions, precautions do apply. Overdosage: If you think you have taken too much of this medicine contact a poison control center or emergency room at once. NOTE: This medicine is only for you. Do not share this medicine with others. What if I miss a dose? It is important not to miss your dose. Call your doctor or health care professional if you are unable to keep an appointment. What may interact with this medication? medicines for cancer chemotherapy medicines that suppress your immune function steroid medicines like prednisone or cortisone This list may not describe all possible interactions. Give your health care provider a list of all the medicines, herbs, non-prescription drugs, or dietary supplements you use. Also tell them if you smoke, drink alcohol, or use illegal drugs. Some items may interact with your medicine. What should I watch for while using this medication? Mild fever and pain should go away in 3 days or less. Report any unusual symptoms to your doctor or health care professional. What side effects may I notice from receiving this medication? Side effects that you should report to your doctor or health care professional as soon as possible: allergic reactions like skin rash, itching or hives, swelling of the face, lips, or tongue breathing problems confused fast or irregular heartbeat fever over 102 degrees F seizures unusual bleeding or bruising unusual muscle weakness Side effects that usually do not require medical attention (report to your doctor or health care professional if they  continue or are bothersome): aches and pains diarrhea fever of 102 degrees F or  less headache irritable loss of appetite pain, tender at site where injected trouble sleeping This list may not describe all possible side effects. Call your doctor for medical advice about side effects. You may report side effects to FDA at 1-800-FDA-1088. Where should I keep my medication? This does not apply. This vaccine is given in a clinic, pharmacy, doctor's office, or other health care setting and will not be stored at home. NOTE: This sheet is a summary. It may not cover all possible information. If you have questions about this medicine, talk to your doctor, pharmacist, or health care provider.  2022 Elsevier/Gold Standard (2014-05-07 00:00:00)

## 2021-10-05 ENCOUNTER — Encounter: Payer: Self-pay | Admitting: Internal Medicine

## 2021-10-05 DIAGNOSIS — D631 Anemia in chronic kidney disease: Secondary | ICD-10-CM | POA: Diagnosis not present

## 2021-10-05 DIAGNOSIS — N3281 Overactive bladder: Secondary | ICD-10-CM | POA: Diagnosis not present

## 2021-10-05 DIAGNOSIS — I152 Hypertension secondary to endocrine disorders: Secondary | ICD-10-CM | POA: Diagnosis not present

## 2021-10-05 DIAGNOSIS — N1832 Chronic kidney disease, stage 3b: Secondary | ICD-10-CM | POA: Diagnosis not present

## 2021-10-05 DIAGNOSIS — N2581 Secondary hyperparathyroidism of renal origin: Secondary | ICD-10-CM | POA: Diagnosis not present

## 2021-10-05 DIAGNOSIS — E1159 Type 2 diabetes mellitus with other circulatory complications: Secondary | ICD-10-CM | POA: Diagnosis not present

## 2021-10-05 DIAGNOSIS — I129 Hypertensive chronic kidney disease with stage 1 through stage 4 chronic kidney disease, or unspecified chronic kidney disease: Secondary | ICD-10-CM | POA: Diagnosis not present

## 2021-10-05 DIAGNOSIS — E1122 Type 2 diabetes mellitus with diabetic chronic kidney disease: Secondary | ICD-10-CM | POA: Diagnosis not present

## 2021-10-05 DIAGNOSIS — N3000 Acute cystitis without hematuria: Secondary | ICD-10-CM | POA: Diagnosis not present

## 2021-10-05 DIAGNOSIS — N184 Chronic kidney disease, stage 4 (severe): Secondary | ICD-10-CM | POA: Diagnosis not present

## 2021-10-06 ENCOUNTER — Encounter: Payer: Self-pay | Admitting: Internal Medicine

## 2021-10-06 LAB — URINALYSIS, ROUTINE W REFLEX MICROSCOPIC
Bilirubin Urine: NEGATIVE
Glucose, UA: NEGATIVE
Hgb urine dipstick: NEGATIVE
Ketones, ur: NEGATIVE
Leukocytes,Ua: NEGATIVE
Nitrite: NEGATIVE
Protein, ur: NEGATIVE
Specific Gravity, Urine: 1.009 (ref 1.001–1.035)
pH: 5.5 (ref 5.0–8.0)

## 2021-10-06 LAB — MICROALBUMIN / CREATININE URINE RATIO
Creatinine, Urine: 75 mg/dL (ref 20–275)
Microalb Creat Ratio: 3 mcg/mg creat (ref <30)
Microalb, Ur: 0.2 mg/dL

## 2021-10-06 LAB — URINE CULTURE
MICRO NUMBER:: 13042651
SPECIMEN QUALITY:: ADEQUATE

## 2021-10-17 ENCOUNTER — Ambulatory Visit: Payer: Self-pay | Admitting: Pharmacist

## 2021-10-17 NOTE — Chronic Care Management (AMB) (Signed)
?  Chronic Care Management  ? ?Note ? ?10/17/2021 ?Name: Ayanah Snader MRN: [id number] DOB: 11-07-1940 ? ? ? ?Closing pharmacy CCM case at this time. Closing pharmacy CCM case at this time. Will collaborate with Care Guide to outreach to schedule follow up with RN CM. Patient has clinic contact information for future questions or concerns.  ? ?[name], PharmD, [place], CPP ?Clinical Pharmacist ?Therapist, music at [employer] ?[phone] ? ?

## 2021-10-18 DIAGNOSIS — M6281 Muscle weakness (generalized): Secondary | ICD-10-CM | POA: Diagnosis not present

## 2021-10-18 DIAGNOSIS — M545 Low back pain, unspecified: Secondary | ICD-10-CM | POA: Diagnosis not present

## 2021-10-18 DIAGNOSIS — R262 Difficulty in walking, not elsewhere classified: Secondary | ICD-10-CM | POA: Diagnosis not present

## 2021-10-24 ENCOUNTER — Other Ambulatory Visit: Payer: Self-pay

## 2021-10-24 MED ORDER — AMLODIPINE BESYLATE 5 MG PO TABS: 5.0000 mg | ORAL_TABLET | Freq: Every day | ORAL | 1 refills | Status: DC

## 2021-10-27 DIAGNOSIS — G4733 Obstructive sleep apnea (adult) (pediatric): Secondary | ICD-10-CM | POA: Diagnosis not present

## 2021-11-07 DIAGNOSIS — E785 Hyperlipidemia, unspecified: Secondary | ICD-10-CM | POA: Diagnosis not present

## 2021-11-07 DIAGNOSIS — G4733 Obstructive sleep apnea (adult) (pediatric): Secondary | ICD-10-CM | POA: Diagnosis not present

## 2021-11-07 DIAGNOSIS — E049 Nontoxic goiter, unspecified: Secondary | ICD-10-CM | POA: Diagnosis not present

## 2021-11-07 DIAGNOSIS — I10 Essential (primary) hypertension: Secondary | ICD-10-CM | POA: Diagnosis not present

## 2021-11-07 DIAGNOSIS — E1165 Type 2 diabetes mellitus with hyperglycemia: Secondary | ICD-10-CM | POA: Diagnosis not present

## 2021-11-11 DIAGNOSIS — M47816 Spondylosis without myelopathy or radiculopathy, lumbar region: Secondary | ICD-10-CM | POA: Diagnosis not present

## 2021-11-11 DIAGNOSIS — M48062 Spinal stenosis, lumbar region with neurogenic claudication: Secondary | ICD-10-CM | POA: Diagnosis not present

## 2021-11-11 DIAGNOSIS — M5416 Radiculopathy, lumbar region: Secondary | ICD-10-CM | POA: Diagnosis not present

## 2021-11-11 DIAGNOSIS — M5136 Other intervertebral disc degeneration, lumbar region: Secondary | ICD-10-CM | POA: Diagnosis not present

## 2021-11-14 ENCOUNTER — Telehealth: Payer: Self-pay

## 2021-11-14 NOTE — Chronic Care Management (AMB) (Signed)
?  Chronic Care Management  ? ?Note ? ?11/14/2021 ?Name: Aysiah Jurado MRN: [id number] DOB: 03-28-1941 ? ?Jaslene Marsteller is a 81 y.o. year old female who is a primary care patient of McLean-Scocuzza, Nino Glow, MD. Lashan Macias is currently enrolled in care management services. An additional referral for RNCM was placed.  ? ?Follow up plan: ?Unsuccessful telephone outreach attempt made. A HIPAA compliant phone message was left for the patient providing contact information and requesting a return call.  ?The care management team will reach out to the patient again over the next 7 days.  ?If patient returns call to provider office, please advise to call [place]  at [phone] ? ?Noreene Larsson, RMA ?Care Guide, Embedded Care Coordination ?[facility]  ?[place], [place] [address] ?Direct Dial: [phone] ?[email or web address] ?Website: [email or web address]  ? ?

## 2021-11-15 ENCOUNTER — Encounter: Payer: Self-pay | Admitting: Cardiology

## 2021-11-15 DIAGNOSIS — E049 Nontoxic goiter, unspecified: Secondary | ICD-10-CM | POA: Diagnosis not present

## 2021-11-15 DIAGNOSIS — E1165 Type 2 diabetes mellitus with hyperglycemia: Secondary | ICD-10-CM | POA: Diagnosis not present

## 2021-11-15 DIAGNOSIS — R262 Difficulty in walking, not elsewhere classified: Secondary | ICD-10-CM | POA: Diagnosis not present

## 2021-11-15 DIAGNOSIS — E785 Hyperlipidemia, unspecified: Secondary | ICD-10-CM | POA: Diagnosis not present

## 2021-11-15 DIAGNOSIS — I10 Essential (primary) hypertension: Secondary | ICD-10-CM | POA: Diagnosis not present

## 2021-11-15 DIAGNOSIS — M545 Low back pain, unspecified: Secondary | ICD-10-CM | POA: Diagnosis not present

## 2021-11-15 DIAGNOSIS — M6281 Muscle weakness (generalized): Secondary | ICD-10-CM | POA: Diagnosis not present

## 2021-11-16 ENCOUNTER — Encounter: Payer: Self-pay | Admitting: Internal Medicine

## 2021-11-17 ENCOUNTER — Other Ambulatory Visit: Payer: Self-pay | Admitting: Internal Medicine

## 2021-11-17 DIAGNOSIS — R262 Difficulty in walking, not elsewhere classified: Secondary | ICD-10-CM | POA: Diagnosis not present

## 2021-11-17 DIAGNOSIS — M545 Low back pain, unspecified: Secondary | ICD-10-CM | POA: Diagnosis not present

## 2021-11-17 DIAGNOSIS — M6281 Muscle weakness (generalized): Secondary | ICD-10-CM | POA: Diagnosis not present

## 2021-11-17 NOTE — Telephone Encounter (Signed)
For your information, medication added to med list  ?

## 2021-11-23 ENCOUNTER — Telehealth: Payer: Medicare Other

## 2021-11-23 DIAGNOSIS — M545 Low back pain, unspecified: Secondary | ICD-10-CM | POA: Diagnosis not present

## 2021-11-23 DIAGNOSIS — R262 Difficulty in walking, not elsewhere classified: Secondary | ICD-10-CM | POA: Diagnosis not present

## 2021-11-23 DIAGNOSIS — M6281 Muscle weakness (generalized): Secondary | ICD-10-CM | POA: Diagnosis not present

## 2021-11-28 ENCOUNTER — Ambulatory Visit: Payer: Medicare Other

## 2021-12-06 ENCOUNTER — Encounter: Payer: Self-pay | Admitting: Internal Medicine

## 2021-12-06 DIAGNOSIS — M6281 Muscle weakness (generalized): Secondary | ICD-10-CM | POA: Diagnosis not present

## 2021-12-06 DIAGNOSIS — R262 Difficulty in walking, not elsewhere classified: Secondary | ICD-10-CM | POA: Diagnosis not present

## 2021-12-06 DIAGNOSIS — M545 Low back pain, unspecified: Secondary | ICD-10-CM | POA: Diagnosis not present

## 2021-12-08 DIAGNOSIS — E1165 Type 2 diabetes mellitus with hyperglycemia: Secondary | ICD-10-CM | POA: Diagnosis not present

## 2021-12-08 DIAGNOSIS — E785 Hyperlipidemia, unspecified: Secondary | ICD-10-CM | POA: Diagnosis not present

## 2021-12-08 DIAGNOSIS — I10 Essential (primary) hypertension: Secondary | ICD-10-CM | POA: Diagnosis not present

## 2021-12-09 LAB — HEMOGLOBIN A1C: Hemoglobin A1C: 7.7

## 2021-12-15 DIAGNOSIS — E1165 Type 2 diabetes mellitus with hyperglycemia: Secondary | ICD-10-CM | POA: Diagnosis not present

## 2021-12-15 DIAGNOSIS — E785 Hyperlipidemia, unspecified: Secondary | ICD-10-CM | POA: Diagnosis not present

## 2021-12-15 DIAGNOSIS — E049 Nontoxic goiter, unspecified: Secondary | ICD-10-CM | POA: Diagnosis not present

## 2021-12-15 DIAGNOSIS — I10 Essential (primary) hypertension: Secondary | ICD-10-CM | POA: Diagnosis not present

## 2021-12-20 DIAGNOSIS — M6281 Muscle weakness (generalized): Secondary | ICD-10-CM | POA: Diagnosis not present

## 2021-12-20 DIAGNOSIS — M545 Low back pain, unspecified: Secondary | ICD-10-CM | POA: Diagnosis not present

## 2021-12-20 DIAGNOSIS — R262 Difficulty in walking, not elsewhere classified: Secondary | ICD-10-CM | POA: Diagnosis not present

## 2021-12-21 ENCOUNTER — Encounter: Payer: Medicare Other | Attending: Endocrinology | Admitting: *Deleted

## 2021-12-21 ENCOUNTER — Encounter: Payer: Self-pay | Admitting: *Deleted

## 2021-12-21 VITALS — BP 176/70 | Ht 64.0 in | Wt 198.8 lb

## 2021-12-21 DIAGNOSIS — E119 Type 2 diabetes mellitus without complications: Secondary | ICD-10-CM | POA: Insufficient documentation

## 2021-12-21 DIAGNOSIS — Z713 Dietary counseling and surveillance: Secondary | ICD-10-CM | POA: Diagnosis not present

## 2021-12-21 DIAGNOSIS — Z6834 Body mass index (BMI) 34.0-34.9, adult: Secondary | ICD-10-CM | POA: Insufficient documentation

## 2021-12-21 DIAGNOSIS — E663 Overweight: Secondary | ICD-10-CM | POA: Diagnosis not present

## 2021-12-21 DIAGNOSIS — E1165 Type 2 diabetes mellitus with hyperglycemia: Secondary | ICD-10-CM

## 2021-12-21 NOTE — Progress Notes (Signed)
Diabetes Self-Management Education ? ?Visit Type: First/Initial ? ?Appt. Start Time: 1405 Appt. End Time: 1520 ? ?12/21/2021 ? ?Vickie Kemp, identified by name and date of birth, is a 81 y.o. female with a diagnosis of Diabetes: Type 2.  ? ?ASSESSMENT ? ?Blood pressure (!) 176/70, height '5\' 4"'$  (1.626 m), weight 198 lb 12.8 oz (90.2 kg). ?Body mass index is 34.12 kg/m?. ? ? Diabetes Self-Management Education - 12/21/21 1543   ? ?  ? Visit Information  ? Visit Type First/Initial   ?  ? Initial Visit  ? Diabetes Type Type 2   ? Date Diagnosed 43 + years   ? Are you currently following a meal plan? No   ? Are you taking your medications as prescribed? Yes   ?  ? Health Coping  ? How would you rate your overall health? Fair   ?  ? Psychosocial Assessment  ? Patient Belief/Attitude about Diabetes Other (comment)   "Doesn't get me done, I deal with it"  ? What is the hardest part about your diabetes right now, causing you the most concern, or is the most worrisome to you about your diabetes?   Making healty food and beverage choices   ? Self-care barriers None   ? Self-management support Doctor's office;Family   ? Patient Concerns Nutrition/Meal planning;Glycemic Control;Medication;Monitoring   ? Special Needs None   ? Preferred Learning Style Auditory   ? Learning Readiness Ready   ? How often do you need to have someone help you when you read instructions, pamphlets, or other written materials from your doctor or pharmacy? 1 - Never   ? What is the last grade level you completed in school? college   ?  ? Pre-Education Assessment  ? Patient understands the diabetes disease and treatment process. Needs Review   ? Patient understands incorporating nutritional management into lifestyle. Needs Instruction   ? Patient undertands incorporating physical activity into lifestyle. Needs Instruction   ? Patient understands using medications safely. Needs Review   ? Patient understands monitoring blood glucose, interpreting  and using results Needs Review   ? Patient understands prevention, detection, and treatment of acute complications. Needs Review   ? Patient understands prevention, detection, and treatment of chronic complications. Needs Review   ? Patient understands how to develop strategies to address psychosocial issues. Needs Review   ? Patient understands how to develop strategies to promote health/change behavior. Needs Review   ?  ? Complications  ? Last HgB A1C per patient/outside source 8.2 %   11/07/2021  ? How often do you check your blood sugar? > 4 times/day   She has FreeStyle Libre 2 (8 scans/day). Avg BG 128 -30 days - Above Target 33%, In Target 66%; Below Target 1%  ? Number of hypoglycemic episodes per month 5   5 events in 30 days per FreeStyle reader - 3 events in 7 days  ? Can you tell when your blood sugar is low? Yes   ? What do you do if your blood sugar is low? "eat something"   ? Have you had a dilated eye exam in the past 12 months? Yes   ? Have you had a dental exam in the past 12 months? Yes   ? Are you checking your feet? No   ?  ? Dietary Intake  ? Breakfast sometimes skips; sausage and cheese croissant; bacon or sausage with grits and toast   ? Lunch [place] or ham or tuna sandwich on  white or wheat bread; banana   ? Dinner chicken, fish; pork chops; potatoes, peas, beans, biscuits, cornbread, green beans, broccoli, greens, peppers, onions, carrots, occasional corn and rare pasta   ? Snack (evening) cookies, ice cream, fruit (peach, pear, banana)   ? Beverage(s) water, apple juice, coffee with 2 sugars, diet soda   ?  ? Activity / Exercise  ? Activity / Exercise Type ADL's   reports doing exercise with PT 2 x week for 15 minutes  ?  ? Patient Education  ? Previous Diabetes Education No   ? Disease Pathophysiology Factors that contribute to the development of diabetes;Explored patient's options for treatment of their diabetes   ? Healthy Eating Role of diet in the treatment of diabetes and the  relationship between the three main macronutrients and blood glucose level;Food label reading, portion sizes and measuring food.;Reviewed blood glucose goals for pre and post meals and how to evaluate the patients' food intake on their blood glucose level.;Meal timing in regards to the patients' current diabetes medication.   ? Being Active Role of exercise on diabetes management, blood pressure control and cardiac health.   ? Medications Taught/reviewed insulin/injectables, injection, site rotation, insulin/injectables storage and needle disposal.;Reviewed patients medication for diabetes, action, purpose, timing of dose and side effects.   ? Monitoring Taught/evaluated CGM (comment);Taught/discussed recording of test results and interpretation of SMBG.;Identified appropriate SMBG and/or A1C goals.   Attempted to scan [place] in office but error mesage with sensor. She reports that she just changed it today. She will change when she gets home.  ? Acute complications Taught prevention, symptoms, and  treatment of hypoglycemia - the 15 rule.   ? Chronic complications Relationship between chronic complications and blood glucose control   ? Diabetes Stress and Support Identified and addressed patients feelings and concerns about diabetes   ?  ? Individualized Goals (developed by patient)  ? Reducing Risk Other (comment)   improve blood sugars, decrease medications, prevent diabetes complications, become more fit  ?  ? Outcomes  ? Expected Outcomes Demonstrated interest in learning. Expect positive outcomes   ? Future DMSE 4-6 wks   ? ?  ?  ? ?Individualized Plan for Diabetes Self-Management Training:  ? ?Learning Objective:  Patient will have a greater understanding of diabetes self-management. ?Patient education plan is to attend individual and/or group sessions per assessed needs and concerns. ?  ?Plan:  ? ?Patient Instructions  ?Check blood sugars using FreeStyle Libre - before meals and 2 hours after each meal and as  needed ?Bring FreeStyle [place] reader to the next appointment ? ?Continue PT exercises for    15  minutes   2  days a week ? ?Eat 3 meals day,   1-2  snacks a day ?Space meals 4-6 hours apart ?Don't skip meals - eat at least 1 protein and 1 carbohydrate serving ?Include 1 serving of protein when eating fruit for a snack ?Avoid fruit at bedtime ?Limit desserts/sweets to 2-3 x week ?Avoid sugar sweetened drinks (coffee, juices) unless treating a low blood sugar ? ?Complete 3 Day Food Record and bring to next appt ? ?Carry fast acting glucose and a snack at all times ?Rotate injection sites ? ?Return for appointment on:  Tuesday February 07, 2022 at 1:30 pm with Freda Munro (nurse)  ? ?Expected Outcomes:  Demonstrated interest in learning. Expect positive outcomes ? ?Education material provided:  ?Metallurgist Guidelines ?Simple Meal Plan ?3 Day Food Record ?Glucose tablets ?Symptoms, causes and treatments  of Hypoglycemia ?Healthy Snack Choices (ADA) ? ?If problems or questions, patient to contact team via:   ?Johny Drilling, RN, [place], [place] [phone] ? ?Future DSME appointment: 4-6 wks ?February 07, 2022 with this educator ?

## 2021-12-21 NOTE — Patient Instructions (Signed)
Check blood sugars using FreeStyle Libre - before meals and 2 hours after each meal and as needed ?Bring FreeStyle [place] reader to the next appointment ? ?Continue PT exercises for    15  minutes   2  days a week ? ?Eat 3 meals day,   1-2  snacks a day ?Space meals 4-6 hours apart ?Don't skip meals - eat at least 1 protein and 1 carbohydrate serving ?Include 1 serving of protein when eating fruit for a snack ?Avoid fruit at bedtime ?Limit desserts/sweets to 2-3 x week ?Avoid sugar sweetened drinks (coffee, juices) unless treating a low blood sugar ? ?Complete 3 Day Food Record and bring to next appt ? ?Carry fast acting glucose and a snack at all times ?Rotate injection sites ? ?Return for appointment on:  Tuesday February 07, 2022 at 1:30 pm with Freda Munro (nurse)  ?

## 2021-12-22 ENCOUNTER — Encounter: Payer: Self-pay | Admitting: Podiatry

## 2021-12-22 ENCOUNTER — Ambulatory Visit: Payer: Medicare Other | Admitting: Podiatry

## 2021-12-22 DIAGNOSIS — B351 Tinea unguium: Secondary | ICD-10-CM

## 2021-12-22 DIAGNOSIS — M79675 Pain in left toe(s): Secondary | ICD-10-CM

## 2021-12-22 DIAGNOSIS — R262 Difficulty in walking, not elsewhere classified: Secondary | ICD-10-CM | POA: Diagnosis not present

## 2021-12-22 DIAGNOSIS — M6281 Muscle weakness (generalized): Secondary | ICD-10-CM | POA: Diagnosis not present

## 2021-12-22 DIAGNOSIS — M545 Low back pain, unspecified: Secondary | ICD-10-CM | POA: Diagnosis not present

## 2021-12-22 DIAGNOSIS — I878 Other specified disorders of veins: Secondary | ICD-10-CM

## 2021-12-22 DIAGNOSIS — M79674 Pain in right toe(s): Secondary | ICD-10-CM

## 2021-12-22 DIAGNOSIS — E1159 Type 2 diabetes mellitus with other circulatory complications: Secondary | ICD-10-CM

## 2021-12-22 NOTE — Progress Notes (Signed)
This patient returns to my office for at risk foot care.  This patient requires this care by a professional since this patient will be at risk due to having diabetes mellitus and venous stasis. Patient is wearing an unna boot right leg.  This patient is unable to cut nails herself since the patient cannot reach her nails.These nails are painful walking and wearing shoes.  This patient presents for at risk foot care today. ? ?General Appearance  Alert, conversant and in no acute stress. ? ?Vascular  Dorsalis pedis and posterior tibial  pulses are not  palpable  bilaterally.  Capillary return is within normal limits  bilaterally. Cold feet bilaterally. Absent digital hair  Bilateral. ? ?Neurologic  Senn-Weinstein monofilament wire test within normal limits  bilaterally. Muscle power within normal limits bilaterally. ? ?Nails Thick disfigured discolored nails with subungual debris  from hallux to fifth toes bilaterally. No evidence of bacterial infection or drainage bilaterally. ? ?Orthopedic  No limitations of motion  feet .  No crepitus or effusions noted.  No bony pathology or digital deformities noted. ? ?Skin  normotropic skin with no porokeratosis noted bilaterally.  No signs of infections or ulcers noted.    ? ?Onychomycosis  Pain in right toes  Pain in left toes ? ?Consent was obtained for treatment procedures.   Mechanical debridement of nails 1-5  bilaterally performed with a nail nipper.  Filed with dremel without incident. Told her to use vaseline on skin.  ? ? ?Return office visit    prn                 Told patient to return for periodic foot care and evaluation due to potential at risk complications. ? ? ?Gardiner Barefoot DPM  ?

## 2021-12-26 DIAGNOSIS — M48062 Spinal stenosis, lumbar region with neurogenic claudication: Secondary | ICD-10-CM | POA: Diagnosis not present

## 2021-12-26 DIAGNOSIS — M5416 Radiculopathy, lumbar region: Secondary | ICD-10-CM | POA: Diagnosis not present

## 2021-12-26 DIAGNOSIS — M5136 Other intervertebral disc degeneration, lumbar region: Secondary | ICD-10-CM | POA: Diagnosis not present

## 2021-12-27 DIAGNOSIS — M6281 Muscle weakness (generalized): Secondary | ICD-10-CM | POA: Diagnosis not present

## 2021-12-27 DIAGNOSIS — R262 Difficulty in walking, not elsewhere classified: Secondary | ICD-10-CM | POA: Diagnosis not present

## 2021-12-27 DIAGNOSIS — M545 Low back pain, unspecified: Secondary | ICD-10-CM | POA: Diagnosis not present

## 2022-01-10 ENCOUNTER — Encounter: Payer: Self-pay | Admitting: Internal Medicine

## 2022-01-11 DIAGNOSIS — E1122 Type 2 diabetes mellitus with diabetic chronic kidney disease: Secondary | ICD-10-CM | POA: Diagnosis not present

## 2022-01-25 ENCOUNTER — Telehealth: Payer: Self-pay | Admitting: Internal Medicine

## 2022-01-25 NOTE — Telephone Encounter (Signed)
Copied from [place] [id number]. Topic: Medicare AWV >> Jan 25, 2022  3:13 PM Devoria Glassing wrote: Reason for CRM: Left message for patient to schedule Annual Wellness Visit.  Please schedule with Nurse Health Advisor Denisa O'Brien-Blaney, LPN at [facility].  Please call [phone] ask for [facility]

## 2022-02-07 ENCOUNTER — Ambulatory Visit: Payer: Medicare Other | Admitting: *Deleted

## 2022-02-11 DIAGNOSIS — E1122 Type 2 diabetes mellitus with diabetic chronic kidney disease: Secondary | ICD-10-CM | POA: Diagnosis not present

## 2022-02-16 ENCOUNTER — Other Ambulatory Visit: Payer: Self-pay | Admitting: Internal Medicine

## 2022-02-16 DIAGNOSIS — I10 Essential (primary) hypertension: Secondary | ICD-10-CM

## 2022-02-23 ENCOUNTER — Ambulatory Visit (INDEPENDENT_AMBULATORY_CARE_PROVIDER_SITE_OTHER): Payer: Medicare Other

## 2022-02-23 VITALS — Ht 64.0 in | Wt 180.0 lb

## 2022-02-23 DIAGNOSIS — Z0000 Encounter for general adult medical examination without abnormal findings: Secondary | ICD-10-CM

## 2022-02-23 NOTE — Patient Instructions (Addendum)
  Vickie Kemp , Thank you for taking time to come for your Medicare Wellness Visit. I appreciate your ongoing commitment to your health goals. Please review the following plan we discussed and let me know if I can assist you in the future.   These are the goals we discussed:  Goals       Patient Stated     Weight (lb) < 170 lb (77.1 kg) (pt-stated)      Portion control meals        This is a list of the screening recommended for you and due dates:  Health Maintenance  Topic Date Due   COVID-19 Vaccine (6 - Pfizer series) 03/11/2022*   DEXA scan (bone density measurement)  03/14/2022*   Zoster (Shingles) Vaccine (1 of 2) 05/26/2022*   Tetanus Vaccine  02/24/2023*   Flu Shot  03/14/2022   Hemoglobin A1C  03/27/2022   Eye exam for diabetics  04/25/2022   Urine Protein Check  10/05/2022   Complete foot exam   12/23/2022   Colon Cancer Screening  05/27/2025   Pneumonia Vaccine  Completed   HPV Vaccine  Aged Out  *Topic was postponed. The date shown is not the original due date.

## 2022-02-23 NOTE — Progress Notes (Signed)
Subjective:   Vickie Kemp is a 81 y.o. female who presents for Medicare Annual (Subsequent) preventive examination.  Review of Systems    No ROS.  Medicare Wellness Virtual Visit.  Visual/audio telehealth visit, UTA vital signs.   See social history for additional risk factors.   Cardiac Risk Factors include: advanced age (>40mn, >>62women);hypertension;diabetes mellitus     Objective:    Today's Vitals   02/23/22 1306  Weight: 180 lb (81.6 kg)  Height: '5\' 4"'$  (1.626 m)   Body mass index is 30.9 kg/m.     02/23/2022    1:35 PM 12/21/2021    2:20 PM 11/25/2020    2:22 PM 05/27/2020    9:15 AM 04/24/2020   10:58 AM 11/25/2019    2:10 PM  Advanced Directives  Does Patient Have a Medical Advance Directive? Yes No Yes Yes Yes Yes  Type of [place]of [place]  [place] will Healthcare Power of [place];Out of facility DNR (pink MOST or yellow form)  Does patient want to make changes to medical advance directive? No - Patient declined  No - Patient declined   No - Patient declined  Copy of HLorainein Chart? No - copy requested  No - copy requested No - copy requested  No - copy requested  Would patient like information on creating a medical advance directive?  No - Patient declined        Current Medications (verified) Outpatient Encounter Medications as of 02/23/2022  Medication Sig   acetaminophen (TYLENOL) 650 MG CR tablet Take 650 mg by mouth 3 (three) times daily as needed.   amLODipine (NORVASC) 5 MG tablet Take 1 tablet (5 mg total) by mouth daily.   aspirin EC 81 MG tablet Take 81 mg by mouth daily.   atorvastatin (LIPITOR) 40 MG tablet Take 1 tablet (40 mg total) by mouth daily. Qhs   carvedilol (COREG) 25 MG tablet TAKE 1 TABLET(25 MG) BY MOUTH TWICE DAILY   Cholecalciferol (VITAMIN D3 PO) Take 1 capsule by mouth  daily.   Continuous Blood Gluc Sensor (FREESTYLE LIBRE 2 SENSOR) MISC Apply 1 each topically every 14 (fourteen) days.   Empagliflozin-linaGLIPtin (GLYXAMBI) 10-5 MG TABS Take 1 tablet by mouth daily.   furosemide (LASIX) 40 MG tablet Take 1 tablet (40 mg total) by mouth in the morning and at bedtime. (Patient taking differently: Take 40 mg by mouth daily.)   insulin degludec (TRESIBA FLEXTOUCH) 200 UNIT/ML FlexTouch Pen Inject 32 Units into the skin daily.   insulin lispro (HUMALOG KWIKPEN) 100 UNIT/ML KwikPen Inject 8 Units into the skin 3 (three) times daily. (Patient taking differently: Inject 8 Units into the skin daily with supper.)   Insulin Pen Needle (PEN NEEDLES) 31G X 8 MM MISC 1 pen by Does not apply route 2 (two) times daily as needed.   Insulin Pen Needle 32G X 4 MM MISC 1 Device by Does not apply route 4 (four) times daily - after meals and at bedtime.   latanoprost (XALATAN) 0.005 % ophthalmic solution Place 1 drop into the left eye at bedtime.   losartan (COZAAR) 100 MG tablet Take 1 tablet (100 mg total) by mouth daily.   ONETOUCH ULTRA test strip USE TO CHECK BLOOD SUGAR THREE TIMES DAILY   Turmeric (QC TUMERIC COMPLEX PO) Take 1 capsule by mouth daily.   No facility-administered encounter medications on file as  of 02/23/2022.    Allergies (verified) Amoxicillin   History: Past Medical History:  Diagnosis Date   Chronic kidney disease    Coronary artery disease    MI, pci to mid LAD 2014. LHC 80%OM, 60%RCA, 90% LAD   Diabetes mellitus without complication (HCC)    Hypertension    Sleep apnea    Past Surgical History:  Procedure Laterality Date   ACNE CYST REMOVAL  2008   Groin area   CARDIAC CATHETERIZATION     COLONOSCOPY WITH PROPOFOL N/A 05/27/2020   Procedure: COLONOSCOPY WITH PROPOFOL;  Surgeon: [name], MD;  Location: [facility] ENDOSCOPY;  Service: Gastroenterology;  Laterality: N/A;   CORONARY ANGIOPLASTY     Family History  Problem Relation Age  of Onset   Diabetes Mother    Heart disease Father    Diabetes Sister    Diabetes Brother    Diabetes Brother    Breast cancer Neg Hx    Social History   Socioeconomic History   Marital status: Single    Spouse name: Not on file   Number of children: Not on file   Years of education: Not on file   Highest education level: Not on file  Occupational History   Not on file  Tobacco Use   Smoking status: Never   Smokeless tobacco: Never  Vaping Use   Vaping Use: Never used  Substance and Sexual Activity   Alcohol use: Never   Drug use: Never   Sexual activity: Not on file  Other Topics Concern   Not on file  Social History Narrative   Used to live in [place] Determinants of Health   Financial Resource Strain: Low Risk  (02/23/2022)   Overall Financial Resource Strain (CARDIA)    Difficulty of Paying Living Expenses: Not hard at all  Food Insecurity: No Food Insecurity (02/23/2022)   Hunger Vital Sign    Worried About Running Out of Food in the Last Year: Never true    [place] in the Last Year: Never true  Transportation Needs: No Transportation Needs (02/23/2022)   PRAPARE - Hydrologist (Medical): No    Lack of Transportation (Non-Medical): No  Physical Activity: Not on file  Stress: No Stress Concern Present (02/23/2022)   [place]    Feeling of Stress : Not at all  Social Connections: Unknown (02/23/2022)   Social Connection and Isolation Panel [NHANES]    Frequency of Communication with Friends and Family: More than three times a week    Frequency of Social Gatherings with Friends and Family: More than three times a week    Attends Religious Services: More than 4 times per year    Active Member of [employer] or Organizations: Yes    Attends Music therapist: Not on file    Marital Status: Not on file    Tobacco  Counseling Counseling given: Not Answered   Clinical Intake:  Pre-visit preparation completed: Yes        Diabetes: Yes (Followed by Endocrinology)  How often do you need to have someone help you when you read instructions, pamphlets, or other written materials from your doctor or pharmacy?: 1 - Never Interpreter Needed?: No      Activities of Daily Living    02/23/2022    1:12 PM  In your present state of health, do you  have any difficulty performing the following activities:  Hearing? 0  Vision? 1  Difficulty concentrating or making decisions? 0  Walking or climbing stairs? 1  Comment Cane/walker in use  Dressing or bathing? 0  Doing errands, shopping? 1  Comment [place] and eating ? N  Using the Toilet? N  In the past six months, have you accidently leaked urine? N  Do you have problems with loss of bowel control? N  Managing your Medications? N  Managing your Finances? N  Housekeeping or managing your Housekeeping? N    Patient Care Team: McLean-Scocuzza, Nino Glow, MD as PCP - General (Internal Medicine) Kate Sable, MD as PCP - Cardiology (Cardiology)  Indicate any recent Medical Services you may have received from other than [facility] providers in the past year (date may be approximate).     Assessment:   This is a routine wellness examination for Vickie Kemp.  Virtual Visit via Telephone Note  I connected with  Vickie Kemp on 02/23/22 at  1:00 PM EDT by telephone and verified that I am speaking with the correct person using two identifiers.  Persons participating in the virtual visit: patient/Nurse Health Advisor   I discussed the limitations of performing an evaluation and management service by telehealth. We continued and completed visit with audio only. Some vital signs may be absent or patient reported.   Hearing/Vision screen Hearing Screening - Comments:: Patient is able to hear conversational tones without difficulty. No  issues reported. Vision Screening - Comments:: Followed by [facility] Wears corrective lenses  They have seen their ophthalmologist in the last 12 months.   Dietary issues and exercise activities discussed: Current Exercise Habits: Home exercise routine, Intensity: Mild Healthy diet   Goals Addressed               This Visit's Progress     Patient Stated     COMPLETED: Increase water intake (pt-stated)        Weight (lb) < 170 lb (77.1 kg) (pt-stated)   180 lb (81.6 kg)     Portion control meals       Depression Screen    02/23/2022    1:12 PM 12/21/2021    2:21 PM 10/04/2021    1:58 PM 11/25/2020    2:14 PM 02/12/2020    1:30 PM 11/25/2019    3:08 PM 09/30/2019    2:14 PM  PHQ 2/9 Scores  PHQ - 2 Score 0 0 0 0 0 0 0    Fall Risk    02/23/2022    1:11 PM 12/21/2021    2:21 PM 10/04/2021    1:58 PM 02/17/2021   10:29 AM 11/25/2020    2:17 PM  Fall Risk   Falls in the past year? 0 0 0 0 0  Number falls in past yr:   0 0 0  Injury with Fall?   0 0 0  Risk for fall due to :  Impaired balance/gait History of fall(s) Impaired balance/gait Impaired balance/gait  Follow up Falls evaluation completed  Falls evaluation completed Falls evaluation completed Falls evaluation completed    [place]: Home free of loose throw rugs in walkways, pet beds, electrical cords, etc? Yes  Adequate lighting in your home to reduce risk of falls? Yes   ASSISTIVE DEVICES UTILIZED TO PREVENT FALLS: Life alert? No  Use of a cane, walker or w/c? Yes  Grab bars in the bathroom? Yes  Shower  chair or bench in shower? Yes  Elevated toilet seat or a handicapped toilet? Yes   TIMED UP AND GO: Was the test performed? No .   Cognitive Function:  Patient is alert and oriented x3.       11/25/2020    2:35 PM 11/25/2019    2:39 PM  6CIT Screen  What Year? 0 points 0 points  What month? 0 points 0 points  What time? 0 points 0 points  Count back from 20 0  points 0 points  Months in reverse 0 points 0 points  Repeat phrase  0 points  Total Score  0 points    Immunizations Immunization History  Administered Date(s) Administered   Fluad Quad(high Dose 65+) 04/28/2019, 05/14/2020, 06/07/2021   Influenza, High Dose Seasonal PF 06/04/2017, 06/07/2018   PFIZER Comirnaty(Gray Top)Covid-19 Tri-Sucrose Vaccine 03/18/2021   PFIZER(Purple Top)SARS-COV-2 Vaccination 10/08/2019, 10/29/2019, 05/14/2020   Pfizer Covid-19 Vaccine Bivalent Booster 57yr & up 08/09/2021   Pneumococcal Conjugate-13 10/04/2021   Pneumococcal Polysaccharide-23 05/05/2013   TDAP status: Due, Education has been provided regarding the importance of this vaccine. Advised may receive this vaccine at local pharmacy or Health Dept. Aware to provide a copy of the vaccination record if obtained from local pharmacy or Health Dept. Verbalized acceptance and understanding.  Shingrix Completed?: No.    Education has been provided regarding the importance of this vaccine. Patient has been advised to call insurance company to determine out of pocket expense if they have not yet received this vaccine. Advised may also receive vaccine at local pharmacy or Health Dept. Verbalized acceptance and understanding.  Screening Tests Health Maintenance  Topic Date Due   COVID-19 Vaccine (6 - Pfizer series) 03/11/2022 (Originally 12/08/2021)   DEXA SCAN  03/14/2022 (Originally 11/30/2006)   Zoster Vaccines- Shingrix (1 of 2) 05/26/2022 (Originally 11/30/1991)   TETANUS/TDAP  02/24/2023 (Originally 11/29/1960)   INFLUENZA VACCINE  03/14/2022   HEMOGLOBIN A1C  03/27/2022   OPHTHALMOLOGY EXAM  04/25/2022   URINE MICROALBUMIN  10/05/2022   FOOT EXAM  12/23/2022   COLONOSCOPY (Pts 45-475yrInsurance coverage will need to be confirmed)  05/27/2025   Pneumonia Vaccine 6563Years old  Completed   HPV VACCINES  Aged Out   Health Maintenance There are no preventive care reminders to display for this  patient.  Bone density- deferred per patient.   Lung Cancer Screening: (Low Dose CT Chest recommended if Age 80-80ears, 30 pack-year currently smoking OR have quit w/in 15years.) does not qualify.   Hepatitis C Screening: does not qualify.  Vision Screening: Recommended annual ophthalmology exams for early detection of glaucoma and other disorders of the eye.  Dental Screening: Recommended annual dental exams for proper oral hygiene  Community Resource Referral / Chronic Care Management: CRR required this visit?  No   CCM required this visit?  No      Plan:   Keep all routine maintenance appointments.   I have personally reviewed and noted the following in the patient's chart:   Medical and social history Use of alcohol, tobacco or illicit drugs  Current medications and supplements including opioid prescriptions.  Functional ability and status Nutritional status Physical activity Advanced directives List of other physicians Hospitalizations, surgeries, and ER visits in previous 12 months Vitals Screenings to include cognitive, depression, and falls Referrals and appointments  In addition, I have reviewed and discussed with patient certain preventive protocols, quality metrics, and best practice recommendations. A written personalized care plan for preventive services as  well as general preventive health recommendations were provided to patient.     Varney Biles, LPN   2/63/7858

## 2022-03-03 ENCOUNTER — Encounter: Payer: Self-pay | Admitting: Internal Medicine

## 2022-03-03 ENCOUNTER — Ambulatory Visit (INDEPENDENT_AMBULATORY_CARE_PROVIDER_SITE_OTHER): Payer: Medicare Other | Admitting: Internal Medicine

## 2022-03-03 VITALS — BP 142/84 | HR 68 | Temp 97.6°F | Ht 64.0 in | Wt 195.6 lb

## 2022-03-03 DIAGNOSIS — E1159 Type 2 diabetes mellitus with other circulatory complications: Secondary | ICD-10-CM

## 2022-03-03 DIAGNOSIS — N184 Chronic kidney disease, stage 4 (severe): Secondary | ICD-10-CM

## 2022-03-03 DIAGNOSIS — M545 Low back pain, unspecified: Secondary | ICD-10-CM

## 2022-03-03 DIAGNOSIS — I739 Peripheral vascular disease, unspecified: Secondary | ICD-10-CM | POA: Diagnosis not present

## 2022-03-03 DIAGNOSIS — Z794 Long term (current) use of insulin: Secondary | ICD-10-CM

## 2022-03-03 DIAGNOSIS — I890 Lymphedema, not elsewhere classified: Secondary | ICD-10-CM | POA: Diagnosis not present

## 2022-03-03 DIAGNOSIS — R1084 Generalized abdominal pain: Secondary | ICD-10-CM

## 2022-03-03 DIAGNOSIS — R937 Abnormal findings on diagnostic imaging of other parts of musculoskeletal system: Secondary | ICD-10-CM

## 2022-03-03 DIAGNOSIS — N1832 Chronic kidney disease, stage 3b: Secondary | ICD-10-CM

## 2022-03-03 DIAGNOSIS — Z1231 Encounter for screening mammogram for malignant neoplasm of breast: Secondary | ICD-10-CM | POA: Diagnosis not present

## 2022-03-03 DIAGNOSIS — I824Y3 Acute embolism and thrombosis of unspecified deep veins of proximal lower extremity, bilateral: Secondary | ICD-10-CM | POA: Diagnosis not present

## 2022-03-03 DIAGNOSIS — G8929 Other chronic pain: Secondary | ICD-10-CM

## 2022-03-03 DIAGNOSIS — N3281 Overactive bladder: Secondary | ICD-10-CM

## 2022-03-03 DIAGNOSIS — I152 Hypertension secondary to endocrine disorders: Secondary | ICD-10-CM

## 2022-03-03 DIAGNOSIS — E1122 Type 2 diabetes mellitus with diabetic chronic kidney disease: Secondary | ICD-10-CM | POA: Diagnosis not present

## 2022-03-03 NOTE — Patient Instructions (Addendum)
PCP Dr. Volanda Napoleon   Kidney doctor Date Type Specialty Care Team Description  04/05/2022 Office Visit Nephrology [facility] [address], [place]   [place], [place] [address]   [phone] (Work)   [phone] (Fax)     Mammogram call for app[place]  [place]   Dr. Candiss Norse    Phone Fax E-mail Address  [phone] Not available [place] [address]     Specialties     Nephrology            NP Nurse Practitioner   Primary Contact Information  Phone Fax E-mail Address  [phone] Not available [place] [address]     Specialties     Vascular Surgery

## 2022-03-03 NOTE — Progress Notes (Signed)
Chief Complaint  Patient presents with   Follow-up    5 month f/u with incontinence concerns. Pt states when she is in bed and goes to turn over she urinates uncontrollably.    F/u  1. C/o overactive bladder and dribbling with walking to the bathroom though on bathroom schedule or long car rides she had to hold lasix 40 mg for a couple of doses due to this reason. Tried PT pelvic did not help.? Glaucoma history reviewed so Rx overactive bladder medications not rec given Rx for depends size large today to get from [facility]  2. Dm 2 with htn and CKD (Dr. Rolly Salter appt 03/2022 renal) f/u Dr. Ronnald Collum endocrine will get notes and labs today  3. Chronic leg swelling but legs more red and swollen b/l just had long car ride to [place] in 12/2021 established with vascular appt in 05/2022 will try to move up sooner Spoke with [name] will try to work pt in sooner 4. C/o chronic low back pain and now having abdominal pain mild to moderate ? Triggers nothing tried declines tramadol for chronic back pain MRI 06/2021  IMPRESSION: Multilevel degenerative changes as detailed above. Decreased disc protrusion at L4-L5 with decreased effacement of the subarticular recess. Left foraminal narrowing remains greatest at this level. Marked facet arthropathy at L4-L5 and L5-S1.     Electronically Signed   By: Macy Mis M.D.   On: 07/06/2021 16:   Review of Systems  Constitutional:  Negative for weight loss.  HENT:  Negative for hearing loss.   Eyes:  Negative for blurred vision.  Respiratory:  Negative for shortness of breath.   Cardiovascular:  Negative for chest pain.  Gastrointestinal:  Negative for abdominal pain and blood in stool.  Genitourinary:  Positive for frequency.  Musculoskeletal:  Positive for back pain.  Skin:  Negative for rash.  Neurological:  Negative for headaches.  Psychiatric/Behavioral:  Negative for depression.    Past Medical History:  Diagnosis Date   Chronic kidney disease     Coronary artery disease    MI, pci to mid LAD 2014. LHC 80%OM, 60%RCA, 90% LAD   Diabetes mellitus without complication (HCC)    Hypertension    Sleep apnea    Past Surgical History:  Procedure Laterality Date   ACNE CYST REMOVAL  2008   Groin area   CARDIAC CATHETERIZATION     COLONOSCOPY WITH PROPOFOL N/A 05/27/2020   Procedure: COLONOSCOPY WITH PROPOFOL;  Surgeon: [name], MD;  Location: [facility] ENDOSCOPY;  Service: Gastroenterology;  Laterality: N/A;   CORONARY ANGIOPLASTY     Family History  Problem Relation Age of Onset   Diabetes Mother    Heart disease Father    Diabetes Sister    Diabetes Brother    Diabetes Brother    Breast cancer Neg Hx    Social History   Socioeconomic History   Marital status: Single    Spouse name: Not on file   Number of children: Not on file   Years of education: Not on file   Highest education level: Not on file  Occupational History   Not on file  Tobacco Use   Smoking status: Never   Smokeless tobacco: Never  Vaping Use   Vaping Use: Never used  Substance and Sexual Activity   Alcohol use: Never   Drug use: Never   Sexual activity: Not on file  Other Topics Concern   Not on file  Social History Narrative   Used  to live in [place] Determinants of Health   Financial Resource Strain: Low Risk  (02/23/2022)   Overall Financial Resource Strain (CARDIA)    Difficulty of Paying Living Expenses: Not hard at all  Food Insecurity: No Food Insecurity (02/23/2022)   Hunger Vital Sign    Worried About Running Out of Food in the Last Year: Never true    Ran Out of Food in the Last Year: Never true  Transportation Needs: No Transportation Needs (02/23/2022)   PRAPARE - Hydrologist (Medical): No    Lack of Transportation (Non-Medical): No  Physical Activity: Not on file  Stress: No Stress Concern Present (02/23/2022)   [place]    Feeling of Stress : Not at all  Social Connections: Unknown (02/23/2022)   Social Connection and Isolation Panel [NHANES]    Frequency of Communication with Friends and Family: More than three times a week    Frequency of Social Gatherings with Friends and Family: More than three times a week    Attends Religious Services: More than 4 times per year    Active Member of [employer] or Organizations: Yes    Attends Archivist Meetings: Not on file    Marital Status: Not on file  Intimate Partner Violence: Not At Risk (02/23/2022)   Humiliation, Afraid, Rape, and Kick questionnaire    Fear of Current or Ex-Partner: No    Emotionally Abused: No    Physically Abused: No    Sexually Abused: No   Current Meds  Medication Sig   acetaminophen (TYLENOL) 650 MG CR tablet Take 650 mg by mouth 3 (three) times daily as needed.   amLODipine (NORVASC) 5 MG tablet Take 1 tablet (5 mg total) by mouth daily.   aspirin EC 81 MG tablet Take 81 mg by mouth daily.   atorvastatin (LIPITOR) 40 MG tablet Take 1 tablet (40 mg total) by mouth daily. Qhs   carvedilol (COREG) 25 MG tablet TAKE 1 TABLET(25 MG) BY MOUTH TWICE DAILY   Cholecalciferol (VITAMIN D3 PO) Take 1 capsule by mouth daily.   Continuous Blood Gluc Sensor (FREESTYLE LIBRE 2 SENSOR) MISC Apply 1 each topically every 14 (fourteen) days.   Empagliflozin-linaGLIPtin (GLYXAMBI) 10-5 MG TABS Take 1 tablet by mouth daily.   furosemide (LASIX) 40 MG tablet Take 1 tablet (40 mg total) by mouth in the morning and at bedtime. (Patient taking differently: Take 40 mg by mouth daily.)   insulin degludec (TRESIBA FLEXTOUCH) 200 UNIT/ML FlexTouch Pen Inject 32 Units into the skin daily.   insulin lispro (HUMALOG KWIKPEN) 100 UNIT/ML KwikPen Inject 8 Units into the skin 3 (three) times daily. (Patient taking differently: Inject 8 Units into the skin daily with supper.)   Insulin Pen Needle 32G X 4 MM MISC 1 Device by Does not  apply route 4 (four) times daily - after meals and at bedtime.   latanoprost (XALATAN) 0.005 % ophthalmic solution Place 1 drop into the left eye at bedtime.   ONETOUCH ULTRA test strip USE TO CHECK BLOOD SUGAR THREE TIMES DAILY   Turmeric (QC TUMERIC COMPLEX PO) Take 1 capsule by mouth daily.   Allergies  Allergen Reactions   Amoxicillin Itching   No results found for this or any previous visit (from the past 2160 hour(s)). Objective  Body mass index is 33.57 kg/m. Wt Readings from Last 3 Encounters:  03/03/22 195 lb 9.6 oz (88.7 kg)  02/23/22 180 lb (81.6 kg)  12/21/21 198 lb 12.8 oz (90.2 kg)   Temp Readings from Last 3 Encounters:  03/03/22 97.6 F (36.4 C) (Oral)  10/04/21 98.2 F (36.8 C) (Oral)  02/23/21 98.7 F (37.1 C) (Oral)   BP Readings from Last 3 Encounters:  03/03/22 (!) 142/84  12/21/21 (!) 176/70  10/04/21 132/80   Pulse Readings from Last 3 Encounters:  03/03/22 68  10/04/21 67  10/03/21 62    Physical Exam Vitals and nursing note reviewed.  Constitutional:      Appearance: Normal appearance. She is well-developed and well-groomed.  HENT:     Head: Normocephalic and atraumatic.  Eyes:     Conjunctiva/sclera: Conjunctivae normal.     Pupils: Pupils are equal, round, and reactive to light.  Cardiovascular:     Rate and Rhythm: Normal rate and regular rhythm.     Heart sounds: Normal heart sounds. No murmur heard. Pulmonary:     Effort: Pulmonary effort is normal.     Breath sounds: Normal breath sounds.  Abdominal:     General: Abdomen is flat. Bowel sounds are normal.     Tenderness: There is no abdominal tenderness.  Musculoskeletal:        General: No tenderness.  Skin:    General: Skin is warm and dry.  Neurological:     General: No focal deficit present.     Mental Status: She is alert and oriented to person, place, and time. Mental status is at baseline.     Cranial Nerves: Cranial nerves 2-12 are intact.     Motor: Motor function  is intact.     Coordination: Coordination is intact.     Gait: Gait is intact.     Comments: Bl walks with cane  Psychiatric:        Attention and Perception: Attention and perception normal.        Mood and Affect: Mood and affect normal.        Speech: Speech normal.        Behavior: Behavior normal. Behavior is cooperative.        Thought Content: Thought content normal.        Cognition and Memory: Cognition and memory normal.        Judgment: Judgment normal.     Assessment  Plan  Overactive bladder - Plan: Urinalysis, Routine w reflex microscopic, Urine Culture, CT ABDOMEN PELVIS WO CONTRAST Pelvic pt did not help in the past  Rx depends size large qid #360 RF x 3 today   Generalized abdominal pain - Plan: CT ABDOMEN PELVIS WO CONTRAST  Acute deep vein thrombosis (DVT) of proximal vein of both lower extremities (HCC) - Plan: US Venous Img Lower Bilateral Lymphedema/PVD F/u vascular sooner than 05/2022 they will reach out to pt   Stage 3b chronic kidney disease (HCC) CKD (chronic kidney disease) stage 4, GFR 15-29 ml/min (HCC) R/u renal 03/2031   Type 2 diabetes mellitus with stage 3b chronic kidney disease, with long-term current use of insulin (HCC) Hypertension associated with diabetes ([place]) F/u endocrine Dr. Ronnald Collum ROI today  See med list   Chronic midline low back pain, unspecified whether sciatica present Abnormal MRI, lumbar spine  Declines tramadol for now    HM Flu shot utd pna 23 utd  Prevnar 13 10/04/21 consider prevnar 20 in 5 years consider tdap, shingrix vaccine in the future given Rx prev covid 19 vx utd 5/5  Hep C neg 06/10/19 Carotid artery plaque b/l DEXA 11/20/16 osteopenia Out of pap window Colonoscopy sch 05/27/20 repeat due in 5 years Dr. Marius Ditch Mammogram 10/26/16 negative, 11/18/20 negative ordered pt to call to schedule   Never smoker    06/2021 MRI lumbar  IMPRESSION: Multilevel degenerative changes as detailed above. Decreased  disc protrusion at L4-L5 with decreased effacement of the subarticular recess. Left foraminal narrowing remains greatest at this level. Marked facet arthropathy at L4-L5 and L5-S1. Electronically Signed   By: Macy Mis M.D.   On: 07/06/2021 16:05  Arthritis 08/12/14 degenerative changes CT scan 07/24/16 negative ABI right 0.64, left 0.62 moderate 05/30/17 moderate PVD 05/25/17 left maxillary sinus disease    Specialists [facility]  Endocrine Dr. Ronnald Collum   Provider: Dr. Olivia Mackie McLean-Scocuzza-Internal Medicine

## 2022-03-06 ENCOUNTER — Encounter: Payer: Self-pay | Admitting: Internal Medicine

## 2022-03-07 ENCOUNTER — Other Ambulatory Visit: Payer: Medicare Other

## 2022-03-07 ENCOUNTER — Encounter: Payer: Self-pay | Admitting: Internal Medicine

## 2022-03-08 ENCOUNTER — Other Ambulatory Visit: Payer: Medicare Other

## 2022-03-09 ENCOUNTER — Other Ambulatory Visit (INDEPENDENT_AMBULATORY_CARE_PROVIDER_SITE_OTHER): Payer: Medicare Other

## 2022-03-09 ENCOUNTER — Ambulatory Visit
Admission: RE | Admit: 2022-03-09 | Discharge: 2022-03-09 | Disposition: A | Discharge status: Home / Self Care | Payer: Medicare Other | Source: Ambulatory Visit | Attending: Internal Medicine | Admitting: Internal Medicine

## 2022-03-09 DIAGNOSIS — R1084 Generalized abdominal pain: Secondary | ICD-10-CM | POA: Insufficient documentation

## 2022-03-09 DIAGNOSIS — I700 Atherosclerosis of aorta: Secondary | ICD-10-CM | POA: Diagnosis not present

## 2022-03-09 DIAGNOSIS — N390 Urinary tract infection, site not specified: Secondary | ICD-10-CM | POA: Diagnosis not present

## 2022-03-09 DIAGNOSIS — N3281 Overactive bladder: Secondary | ICD-10-CM

## 2022-03-09 DIAGNOSIS — R109 Unspecified abdominal pain: Secondary | ICD-10-CM | POA: Diagnosis not present

## 2022-03-10 ENCOUNTER — Encounter: Payer: Self-pay | Admitting: Internal Medicine

## 2022-03-10 DIAGNOSIS — I700 Atherosclerosis of aorta: Secondary | ICD-10-CM | POA: Insufficient documentation

## 2022-03-10 LAB — URINALYSIS, ROUTINE W REFLEX MICROSCOPIC
Bilirubin Urine: NEGATIVE
Hgb urine dipstick: NEGATIVE
Ketones, ur: NEGATIVE
Nitrite: NEGATIVE
Specific Gravity, Urine: 1.01 (ref 1.000–1.030)
Total Protein, Urine: NEGATIVE
Urine Glucose: >=1000 — AB
Urobilinogen, UA: 0.2 (ref 0.0–1.0)
pH: 6.5 (ref 5.0–8.0)

## 2022-03-11 LAB — URINE CULTURE
MICRO NUMBER:: 13703010
SPECIMEN QUALITY:: ADEQUATE

## 2022-03-14 DIAGNOSIS — E1122 Type 2 diabetes mellitus with diabetic chronic kidney disease: Secondary | ICD-10-CM | POA: Diagnosis not present

## 2022-03-17 DIAGNOSIS — E1165 Type 2 diabetes mellitus with hyperglycemia: Secondary | ICD-10-CM | POA: Diagnosis not present

## 2022-03-17 DIAGNOSIS — I10 Essential (primary) hypertension: Secondary | ICD-10-CM | POA: Diagnosis not present

## 2022-03-17 DIAGNOSIS — E785 Hyperlipidemia, unspecified: Secondary | ICD-10-CM | POA: Diagnosis not present

## 2022-03-21 ENCOUNTER — Encounter: Payer: Medicare Other | Attending: Internal Medicine | Admitting: *Deleted

## 2022-03-21 ENCOUNTER — Encounter: Payer: Self-pay | Admitting: *Deleted

## 2022-03-21 VITALS — BP 144/60 | Wt 195.4 lb

## 2022-03-21 DIAGNOSIS — E1165 Type 2 diabetes mellitus with hyperglycemia: Secondary | ICD-10-CM | POA: Insufficient documentation

## 2022-03-21 DIAGNOSIS — Z794 Long term (current) use of insulin: Secondary | ICD-10-CM | POA: Insufficient documentation

## 2022-03-21 NOTE — Patient Instructions (Signed)
Check blood sugars before meals, 2 hours after meals and as needed with FreeStyle Libre  Exercise: Continue PT exercises for    15  minutes   2-3  days a week  Eat 3 meals day,   2  snacks a day Space meals 4-6 hours apart Include 1 serving of protein when eating fruit for snack Avoid sugar sweetened drinks (soda, juices) unless treating a low blood sugar  Carry fast acting glucose and a snack at all times Rotate injection sites

## 2022-03-21 NOTE — Progress Notes (Signed)
Diabetes Self-Management Education  Visit Type: Follow-up  Appt. Start Time: 1287 Appt. End Time: 1405  03/21/2022  [name], identified by name and date of birth, is a 81 y.o. female with a diagnosis of Diabetes: Type 2.   ASSESSMENT  Blood pressure (!) 144/60, weight 195 lb 6.4 oz (88.6 kg). Body mass index is 33.54 kg/m.   Diabetes Self-Management Education - 03/21/22 1415       Visit Information   Visit Type Follow-up      Initial Visit   Diabetes Type Type 2      Complications   How often do you check your blood sugar? > 4 times/day   FreeStyle Libre (10 scans/day) - 30 and 90 days Avg 122 mg/dL, Above Target 28%, In Target 71% and Below Target 1%   Number of hypoglycemic episodes per month 4    Can you tell when your blood sugar is low? Yes    What do you do if your blood sugar is low? 1 glucose tablet    Have you had a dilated eye exam in the past 12 months? Yes    Have you had a dental exam in the past 12 months? Yes    Are you checking your feet? No   sees podiatrist for foot care - discussed using a mirror to check feet     Dietary Intake   Breakfast 3 meals and 2 snacks/day - see 3 Day Food Record      Activity / Exercise   Activity / Exercise Type ADL's      Patient Education   Disease Pathophysiology Factors that contribute to the development of diabetes;Explored patient's options for treatment of their diabetes    Healthy Eating Role of diet in the treatment of diabetes and the relationship between the three main macronutrients and blood glucose level;Food label reading, portion sizes and measuring food.;Reviewed blood glucose goals for pre and post meals and how to evaluate the patients' food intake on their blood glucose level.;Meal timing in regards to the patients' current diabetes medication.    Being Active Role of exercise on diabetes management, blood pressure control and cardiac health.    Medications Taught/reviewed insulin/injectables,  injection, site rotation, insulin/injectables storage and needle disposal.;Reviewed patients medication for diabetes, action, purpose, timing of dose and side effects.    Monitoring Taught/evaluated CGM (comment);Taught/discussed recording of test results and interpretation of SMBG.;Identified appropriate SMBG and/or A1C goals.;Daily foot exams    Acute complications Taught prevention, symptoms, and  treatment of hypoglycemia - the 15 rule.    Chronic complications Relationship between chronic complications and blood glucose control    Diabetes Stress and Support Identified and addressed patients feelings and concerns about diabetes      Individualized Goals (developed by patient)   Nutrition Follow meal plan discussed;General guidelines for healthy choices and portions discussed    Physical Activity Exercise 3-5 times per week;15 minutes per day    Monitoring  Test my blood glucose as discussed;Consistenly use CGM    Reducing Risk do foot checks daily      Post-Education Assessment   Patient understands the diabetes disease and treatment process. Demonstrates understanding / competency    Patient understands incorporating nutritional management into lifestyle. Comprehends key points    Patient undertands incorporating physical activity into lifestyle. Needs Review    Patient understands using medications safely. Demonstrates understanding / competency    Patient understands monitoring blood glucose, interpreting and using results Comprehends key points  Patient understands prevention, detection, and treatment of acute complications. Comprehends key points    Patient understands prevention, detection, and treatment of chronic complications. Demonstrates understanding / competency    Patient understands how to develop strategies to address psychosocial issues. Demonstrates understanding / competency    Patient understands how to develop strategies to promote health/change behavior. Demonstrates  understanding / competency      Outcomes   Expected Outcomes Demonstrated interest in learning. Expect positive outcomes    Program Status Completed      Subsequent Visit   Since your last visit have you continued or begun to take your medications as prescribed? Yes    Since your last visit have you had your blood pressure checked? Yes    Is your most recent blood pressure lower, unchanged, or higher since your last visit? Lower    Since your last visit have you experienced any weight changes? Loss    Weight Loss (lbs) 3.4    Since your last visit, are you checking your blood glucose at least once a day? Yes             Individualized Plan for Diabetes Self-Management Training:   Learning Objective:  Patient will have a greater understanding of diabetes self-management. Patient education plan is to attend individual and/or group sessions per assessed needs and concerns.   Plan:   Patient Instructions  Check blood sugars before meals, 2 hours after meals and as needed with FreeStyle Libre  Exercise: Continue PT exercises for    15  minutes   2-3  days a week  Eat 3 meals day,   2  snacks a day Space meals 4-6 hours apart Include 1 serving of protein when eating fruit for snack Avoid sugar sweetened drinks (soda, juices) unless treating a low blood sugar  Carry fast acting glucose and a snack at all times Rotate injection sites   Expected Outcomes:  Demonstrated interest in learning. Expect positive outcomes  Education material provided:  Planning a Balanced Meal  If problems or questions, patient to contact team via:   Johny Drilling, RN, [place], [place] [phone]  Future DSME appointment:   PRN

## 2022-03-23 DIAGNOSIS — I10 Essential (primary) hypertension: Secondary | ICD-10-CM | POA: Diagnosis not present

## 2022-03-23 DIAGNOSIS — E1165 Type 2 diabetes mellitus with hyperglycemia: Secondary | ICD-10-CM | POA: Diagnosis not present

## 2022-03-23 DIAGNOSIS — E785 Hyperlipidemia, unspecified: Secondary | ICD-10-CM | POA: Diagnosis not present

## 2022-03-23 DIAGNOSIS — E049 Nontoxic goiter, unspecified: Secondary | ICD-10-CM | POA: Diagnosis not present

## 2022-03-27 ENCOUNTER — Ambulatory Visit: Payer: Medicare Other | Admitting: Podiatry

## 2022-03-27 ENCOUNTER — Encounter: Payer: Self-pay | Admitting: Podiatry

## 2022-03-27 DIAGNOSIS — M79674 Pain in right toe(s): Secondary | ICD-10-CM | POA: Diagnosis not present

## 2022-03-27 DIAGNOSIS — I878 Other specified disorders of veins: Secondary | ICD-10-CM

## 2022-03-27 DIAGNOSIS — E1159 Type 2 diabetes mellitus with other circulatory complications: Secondary | ICD-10-CM | POA: Diagnosis not present

## 2022-03-27 DIAGNOSIS — B351 Tinea unguium: Secondary | ICD-10-CM | POA: Diagnosis not present

## 2022-03-27 DIAGNOSIS — M79675 Pain in left toe(s): Secondary | ICD-10-CM

## 2022-03-27 NOTE — Progress Notes (Signed)
This patient returns to my office for at risk foot care.  This patient requires this care by a professional since this patient will be at risk due to having diabetes mellitus and venous stasis. Patient is wearing an unna boot right leg.  This patient is unable to cut nails herself since the patient cannot reach her nails.These nails are painful walking and wearing shoes.  This patient presents for at risk foot care today.  General Appearance  Alert, conversant and in no acute stress.  Vascular  Dorsalis pedis and posterior tibial  pulses are not  palpable  bilaterally.  Capillary return is within normal limits  bilaterally. Cold feet bilaterally. Absent digital hair  Bilateral.  Neurologic  Senn-Weinstein monofilament wire test within normal limits  bilaterally. Muscle power within normal limits bilaterally.  Nails Thick disfigured discolored nails with subungual debris  from hallux to fifth toes bilaterally. No evidence of bacterial infection or drainage bilaterally.  Orthopedic  No limitations of motion  feet .  No crepitus or effusions noted.  No bony pathology or digital deformities noted.  Skin  normotropic skin with no porokeratosis noted bilaterally.  No signs of infections or ulcers noted.     Onychomycosis  Pain in right toes  Pain in left toes  Consent was obtained for treatment procedures.   Mechanical debridement of nails 1-5  bilaterally performed with a nail nipper.  Filed with dremel without incident.  Patient says she has burning in her feet.  Told her to check with her vascular and diabetic doctor.   Return office visit    3 months                Told patient to return for periodic foot care and evaluation due to potential at risk complications.   Gardiner Barefoot DPM

## 2022-03-30 ENCOUNTER — Other Ambulatory Visit: Payer: Self-pay | Admitting: Internal Medicine

## 2022-03-30 DIAGNOSIS — E1159 Type 2 diabetes mellitus with other circulatory complications: Secondary | ICD-10-CM

## 2022-04-02 NOTE — Progress Notes (Signed)
MRN : [id number]  Vickie Kemp is a 81 y.o. ([birth year]) female who presents with chief complaint of legs swell.  History of Present Illness:   Patient is seen for evaluation of leg pain and swelling associated with new onset ulceration bilaterally. The patient first noticed the swelling remotely. The swelling is associated with pain and discoloration. The pain and swelling worsens with prolonged dependency and improves with elevation. The pain is unrelated to activity.  The patient notes that in the morning the legs are better but the leg symptoms worsened throughout the course of the day. The patient has also noted a progressive worsening of the discoloration in the ankle and shin area.   The patient notes that an ulcer has developed acutely without specific trauma and since it occurred it has been very slow to heal.  There is a moderate amount of drainage associated with the open area.  The wound is also very painful.  The patient denies claudication symptoms or rest pain symptoms.   The patient has not had any past angiography, interventions or vascular surgery.  Elevation makes the leg symptoms better, dependency makes them much worse. The patient denies any recent changes in medications.  The patient has not been wearing graduated compression.  The patient denies a history of DVT or PE. There is no prior history of phlebitis. There is no history of primary lymphedema.  No history of malignancies. No history of trauma or groin or pelvic surgery. There is no history of radiation treatment to the groin or pelvis       No outpatient medications have been marked as taking for the 04/03/22 encounter (Appointment) with Delana Meyer, Dolores Lory, MD.    Past Medical History:  Diagnosis Date   Chronic kidney disease    Coronary artery disease    MI, pci to mid LAD 2014. LHC 80%OM, 60%RCA, 90% LAD   Diabetes mellitus without complication (HCC)    Hypertension    Sleep apnea      Past Surgical History:  Procedure Laterality Date   ACNE CYST REMOVAL  2008   Groin area   CARDIAC CATHETERIZATION     COLONOSCOPY WITH PROPOFOL N/A 05/27/2020   Procedure: COLONOSCOPY WITH PROPOFOL;  Surgeon: [name], MD;  Location: [facility] ENDOSCOPY;  Service: Gastroenterology;  Laterality: N/A;   CORONARY ANGIOPLASTY      Social History Social History   Tobacco Use   Smoking status: Never   Smokeless tobacco: Never  Vaping Use   Vaping Use: Never used  Substance Use Topics   Alcohol use: Never   Drug use: Never    Family History Family History  Problem Relation Age of Onset   Diabetes Mother    Heart disease Father    Diabetes Sister    Diabetes Brother    Diabetes Brother    Breast cancer Neg Hx     Allergies  Allergen Reactions   Amoxicillin Itching     REVIEW OF SYSTEMS (Negative unless checked)  Constitutional: '[]'$ Weight loss  '[]'$ Fever  '[]'$ Chills Cardiac: '[]'$ Chest pain   '[]'$ Chest pressure   '[]'$ Palpitations   '[]'$ Shortness of breath when laying flat   '[]'$ Shortness of breath with exertion. Vascular:  '[]'$ Pain in legs with walking   '[x]'$ Pain in legs with standing  '[]'$ History of DVT   '[]'$ Phlebitis   '[x]'$ Swelling in legs   '[]'$ Varicose veins   '[]'$ Non-healing ulcers Pulmonary:   '[]'$ Uses home oxygen   '[]'$ Productive cough   '[]'$ Hemoptysis   '[]'$ Wheeze  '[]'$   COPD   '[]'$ Asthma Neurologic:  '[]'$ Dizziness   '[]'$ Seizures   '[]'$ History of stroke   '[]'$ History of TIA  '[]'$ Aphasia   '[]'$ Vissual changes   '[]'$ Weakness or numbness in arm   '[]'$ Weakness or numbness in leg Musculoskeletal:   '[]'$ Joint swelling   '[]'$ Joint pain   '[]'$ Low back pain Hematologic:  '[]'$ Easy bruising  '[]'$ Easy bleeding   '[]'$ Hypercoagulable state   '[]'$ Anemic Gastrointestinal:  '[]'$ Diarrhea   '[]'$ Vomiting  '[]'$ Gastroesophageal reflux/heartburn   '[]'$ Difficulty swallowing. Genitourinary:  '[]'$ Chronic kidney disease   '[]'$ Difficult urination  '[]'$ Frequent urination   '[]'$ Blood in urine Skin:  '[]'$ Rashes   '[x]'$ Ulcers  Psychological:  '[]'$ History of anxiety   '[]'$   History of major depression.  Physical Examination  There were no vitals filed for this visit. There is no height or weight on file to calculate BMI. Gen: WD/WN, NAD Head: [place]/AT, No temporalis wasting.  Ear/Nose/Throat: Hearing grossly intact, nares w/o erythema or drainage, pinna without lesions Eyes: PER, EOMI, sclera nonicteric.  Neck: Supple, no gross masses.  No JVD.  Pulmonary:  Good air movement, no audible wheezing, no use of accessory muscles.  Cardiac: RRR, precordium not hyperdynamic. Vascular:   small weeping ulcers bilaterally.  Mild venous stasis changes to the legs bilaterally.  3-4+ soft pitting edema  Vessel Right Left  Radial Palpable Palpable  Gastrointestinal: soft, non-distended. No guarding/no peritoneal signs.  Musculoskeletal: M/S 5/5 throughout.  No deformity.  Neurologic: CN 2-12 intact. Pain and light touch intact in extremities.  Symmetrical.  Speech is fluent. Motor exam as listed above. Psychiatric: Judgment intact, Mood & affect appropriate for pt's clinical situation. Dermatologic: Venous rashes no ulcers noted.  No changes consistent with cellulitis. Lymph : No lichenification or skin changes of chronic lymphedema.  CBC Lab Results  Component Value Date   WBC 8.8 09/27/2021   HGB 12.1 09/27/2021   HCT 37.6 09/27/2021   MCV 88.6 09/27/2021   PLT 204.0 09/27/2021    BMET    Component Value Date/Time   NA 144 09/27/2021 0941   K 4.1 09/27/2021 0941   CL 103 09/27/2021 0941   CO2 32 09/27/2021 0941   GLUCOSE 86 09/27/2021 0941   BUN 19 09/27/2021 0941   CREATININE 1.37 (H) 09/27/2021 0941   CALCIUM 9.6 09/27/2021 0941   GFRNONAA 31 (L) 06/02/2020 1103   GFRAA 37 (L) 04/24/2020 1119   CrCl cannot be calculated (Patient's most recent lab result is older than the maximum 21 days allowed.).  COAG No results found for: "INR", "PROTIME"  Radiology CT ABDOMEN PELVIS WO CONTRAST  Result Date: 03/10/2022 CLINICAL DATA:  Abdominal pain. EXAM:  CT ABDOMEN AND PELVIS WITHOUT CONTRAST TECHNIQUE: Multidetector CT imaging of the abdomen and pelvis was performed following the standard protocol without IV contrast. RADIATION DOSE REDUCTION: This exam was performed according to the departmental dose-optimization program which includes automated exposure control, adjustment of the mA and/or kV according to patient size and/or use of iterative reconstruction technique. COMPARISON:  None Available. FINDINGS: Lower chest: No acute abnormality. Hepatobiliary: No focal liver abnormality is seen. No gallstones, gallbladder wall thickening, or biliary dilatation. Pancreas: Unremarkable. No pancreatic ductal dilatation or surrounding inflammatory changes. Spleen: Normal in size without focal abnormality. Adrenals/Urinary Tract: Normal adrenal glands. Kidneys normal in size, orientation and position. There are significant bilateral vascular calcifications. No renal masses. No hydronephrosis. Normal ureters. Normal bladder. Stomach/Bowel: Small hiatal hernia. Stomach otherwise unremarkable. Small bowel and colon are normal in caliber. No wall thickening. No inflammation. Mild generalized increase in the  colonic stool burden. Normal appendix visualized. Vascular/Lymphatic: Aortic atherosclerosis extending to involve its branch vessels. No aneurysm. No enlarged lymph nodes. Reproductive: Uterus and bilateral adnexa are unremarkable. Other: No ascites. Musculoskeletal: Minor compression deformity of L2 that appears chronic, associated with a prominent Schmorl's node. No acute fractures. No bone lesions. There are degenerative changes throughout the lumbar spine a grade 1 anterolisthesis of L4 on L5. IMPRESSION: 1. No acute findings. 2. Moderate increase in the colonic stool burden. No bowel inflammation. 3. Significant arterial atherosclerotic calcifications including aortic atherosclerosis. Electronically Signed   By: Lajean Manes M.D.   On: 03/10/2022 15:44      Assessment/Plan 1. Lymphedema No surgery or intervention at this point in time.    I have had a long discussion with the patient regarding venous insufficiency and why it  causes symptoms, specifically venous ulceration. I have discussed with the patient the chronic skin changes that accompany venous insufficiency and the long term sequela such as infection and recurring  ulceration.  Patient will be placed in [place] will be changed weekly drainage permitting.  In addition, behavioral modification including several periods of elevation of the lower extremities during the day will be continued. Achieving a position with the ankles at heart level was stressed to the patient  The patient is instructed to begin routine exercise, especially walking on a daily basis  In the future the patient can be assessed for graduated compression stockings or wraps as well as a Lymph Pump once the ulcers are healed.   2. Mixed hyperlipidemia Continue antihypertensive medications as already ordered, these medications have been reviewed and there are no changes at this time.   3. Type 2 diabetes mellitus with stage 3b chronic kidney disease, with long-term current use of insulin ([place]) Continue hypoglycemic medications as already ordered, these medications have been reviewed and there are no changes at this time.  Hgb A1C to be monitored as already arranged by primary service   4. Essential hypertension Continue antihypertensive medications as already ordered, these medications have been reviewed and there are no changes at this time.     Hortencia Pilar, MD  04/02/2022 4:58 PM

## 2022-04-03 ENCOUNTER — Encounter (INDEPENDENT_AMBULATORY_CARE_PROVIDER_SITE_OTHER): Payer: Self-pay | Admitting: Vascular Surgery

## 2022-04-03 ENCOUNTER — Ambulatory Visit (INDEPENDENT_AMBULATORY_CARE_PROVIDER_SITE_OTHER): Payer: Medicare Other | Admitting: Vascular Surgery

## 2022-04-03 VITALS — BP 124/65 | HR 65 | Resp 16 | Wt 192.6 lb

## 2022-04-03 DIAGNOSIS — N1832 Chronic kidney disease, stage 3b: Secondary | ICD-10-CM

## 2022-04-03 DIAGNOSIS — E1122 Type 2 diabetes mellitus with diabetic chronic kidney disease: Secondary | ICD-10-CM

## 2022-04-03 DIAGNOSIS — I10 Essential (primary) hypertension: Secondary | ICD-10-CM

## 2022-04-03 DIAGNOSIS — I890 Lymphedema, not elsewhere classified: Secondary | ICD-10-CM

## 2022-04-03 DIAGNOSIS — E782 Mixed hyperlipidemia: Secondary | ICD-10-CM | POA: Diagnosis not present

## 2022-04-03 DIAGNOSIS — Z794 Long term (current) use of insulin: Secondary | ICD-10-CM

## 2022-04-04 ENCOUNTER — Telehealth: Payer: Self-pay | Admitting: Internal Medicine

## 2022-04-04 NOTE — Telephone Encounter (Signed)
Patient called and said that Vickie Kemp refilled her TRESIBA FLEXTOUCH 200 UNIT/ML FlexTouch Pen, but the doses are different.

## 2022-04-05 ENCOUNTER — Telehealth: Payer: Self-pay

## 2022-04-05 DIAGNOSIS — I10 Essential (primary) hypertension: Secondary | ICD-10-CM | POA: Diagnosis not present

## 2022-04-05 DIAGNOSIS — R600 Localized edema: Secondary | ICD-10-CM | POA: Diagnosis not present

## 2022-04-05 DIAGNOSIS — N1832 Chronic kidney disease, stage 3b: Secondary | ICD-10-CM | POA: Diagnosis not present

## 2022-04-05 DIAGNOSIS — D631 Anemia in chronic kidney disease: Secondary | ICD-10-CM | POA: Diagnosis not present

## 2022-04-05 DIAGNOSIS — E1122 Type 2 diabetes mellitus with diabetic chronic kidney disease: Secondary | ICD-10-CM | POA: Diagnosis not present

## 2022-04-05 DIAGNOSIS — N2581 Secondary hyperparathyroidism of renal origin: Secondary | ICD-10-CM | POA: Diagnosis not present

## 2022-04-05 NOTE — Telephone Encounter (Signed)
Pt notified and will be reaching out to Dr. Ronnald Collum

## 2022-04-05 NOTE — Telephone Encounter (Signed)
Spoke w/pt in relayed the advice Dr. Olivia Mackie had for in regards to her insulin medication she verbalized understandging and will be reaching out to Dr. Ronnald Collum    McLean-Scocuzza, Nino Glow, MD  You 1 hour ago (7:43 AM)   TM Is pt being managed by Dr. Ronnald Collum for diabetes?  She should call him and have him adjust insulin doses only managed by him and let us know the dose change he recommends  Inform pt         Note    You  McLean-Scocuzza, Nino Glow, MD 19 hours ago (2:00 PM)    Spoke with pt and looked in her chart, pt is worried about the [place] dosage change. Padonda sent in 56 units to her pharmacy and her normal 32 units were discontinued. Pt is wondering why the dosage change pls advise.

## 2022-04-05 NOTE — Telephone Encounter (Signed)
Is pt being managed by Dr. Ronnald Collum for diabetes?  She should call him and have him adjust insulin doses only managed by him and let us know the dose change he recommends  Inform pt

## 2022-04-06 ENCOUNTER — Telehealth (INDEPENDENT_AMBULATORY_CARE_PROVIDER_SITE_OTHER): Payer: Self-pay

## 2022-04-06 NOTE — Telephone Encounter (Signed)
Orders to start home health for skilled nursing with bilateral unna wraps be done weekly has been faxed to [facility] home health. Patient has been made aware that someone will contact her and will start next week.

## 2022-04-07 ENCOUNTER — Ambulatory Visit (LOCAL_COMMUNITY_HEALTH_CENTER): Payer: Medicare Other

## 2022-04-07 ENCOUNTER — Ambulatory Visit: Payer: Medicare Other | Admitting: Cardiology

## 2022-04-07 ENCOUNTER — Encounter: Payer: Self-pay | Admitting: Cardiology

## 2022-04-07 VITALS — BP 148/70 | HR 69 | Ht 64.0 in | Wt 193.1 lb

## 2022-04-07 DIAGNOSIS — Z23 Encounter for immunization: Secondary | ICD-10-CM | POA: Diagnosis not present

## 2022-04-07 DIAGNOSIS — I2510 Atherosclerotic heart disease of native coronary artery without angina pectoris: Secondary | ICD-10-CM

## 2022-04-07 DIAGNOSIS — I10 Essential (primary) hypertension: Secondary | ICD-10-CM | POA: Diagnosis not present

## 2022-04-07 DIAGNOSIS — Z719 Counseling, unspecified: Secondary | ICD-10-CM

## 2022-04-07 DIAGNOSIS — I5189 Other ill-defined heart diseases: Secondary | ICD-10-CM

## 2022-04-07 DIAGNOSIS — I272 Pulmonary hypertension, unspecified: Secondary | ICD-10-CM | POA: Diagnosis not present

## 2022-04-07 NOTE — Patient Instructions (Signed)
Medication Instructions:   Your physician recommends that you continue on your current medications as directed. Please refer to the Current Medication list given to you today.   *If you need a refill on your cardiac medications before your next appointment, please call your pharmacy*  Follow-Up: At [facility], you and your health needs are our priority.  As part of our continuing mission to provide you with exceptional heart care, we have created designated Provider Care Teams.  These Care Teams include your primary Cardiologist (physician) and Advanced Practice Providers (APPs -  Physician Assistants and Nurse Practitioners) who all work together to provide you with the care you need, when you need it.  We recommend signing up for the patient portal called "MyChart".  Sign up information is provided on this After Visit Summary.  MyChart is used to connect with patients for Virtual Visits (Telemedicine).  Patients are able to view lab/test results, encounter notes, upcoming appointments, etc.  Non-urgent messages can be sent to your provider as well.   To learn more about what you can do with MyChart, go to [email or web address].    Your next appointment:   1 year(s)  The format for your next appointment:   In Person  Provider:   Brian Agbor-Etang, MD    Other Instructions   Important Information About Sugar       

## 2022-04-07 NOTE — Progress Notes (Signed)
  Are you feeling sick today? No   Have you ever received a dose of COVID-19 Vaccine? [employer], [place], [place], [place], Other) Yes  If yes, which vaccine and how many doses?   Pfizer, 5   Did you bring the vaccination record card or other documentation?  Yes   Do you have a health condition or are undergoing treatment that makes you moderately or severely immunocompromised? This would include, but not be limited to: cancer, HIV, organ transplant, immunosuppressive therapy/high-dose corticosteroids, or moderate/severe primary immunodeficiency.  No  Have you received COVID-19 vaccine before or during hematopoietic cell transplant (HCT) or CAR-T-cell therapies? No  Have you ever had an allergic reaction to: (This would include a severe allergic reaction or a reaction that caused hives, swelling, or respiratory distress, including wheezing.) A component of a COVID-19 vaccine or a previous dose of COVID-19 vaccine? No   Have you ever had an allergic reaction to another vaccine (other thanCOVID-19 vaccine) or an injectable medication? (This would include a severe allergic reaction or a reaction that caused hives, swelling, or respiratory distress, including wheezing.)   No    Do you have a history of any of the following:  Myocarditis or Pericarditis No  Dermal fillers:  No  Multisystem Inflammatory Syndrome (MIS-C or MIS-A)? No  COVID-19 disease within the past 3 months? No  Vaccinated with monkeypox vaccine in the last 4 weeks? No  Pt in clinic for [place] booster. Eligible and administered, tolerated well. Given, VIS, [facility] copy and covid vaccination record card. M.Keonta Alsip, LPN.

## 2022-04-07 NOTE — Progress Notes (Signed)
Cardiology Office Note:    Date:  04/07/2022   ID:  Marivel Mcclarty, DOB Sep 20, 1940, MRN [id number]  PCP:  McLean-Scocuzza, Nino Glow, MD  Cardiologist:  Kate Sable, MD  Electrophysiologist:  None   Referring MD: McLean-Scocuzza, Olivia Mackie *   Chief Complaint  Patient presents with   Other    6 month f/u no complaints today. Meds reviewed verbally with pt.    History of Present Illness:    Vickie Kemp is a 81 y.o. female with a hx of HFpEF, hypertension, CKD, OSA noncompliant with CPAP, obesity, CAD/MI (status post DES to mLAD 04/2013 at [facility]), pulmonary hypertension who presents for follow-up.  Being seen for HFpEF, pulmonary hypertension.  States having chronic fatigue and joint pains.  Previously recommended to follow-up with pulmonary medicine, declined.  States not using CPAP, endorses fatigue, shortness of breath chronically.  Restrictive lung disease noted on previous pulmonary function testing.   Prior notes Echocardiogram on 09/4823 normal systolic function, EF 55 to 00%, grade 2 diastolic dysfunction, severely elevated pulmonary artery systolic pressure, 37.0 mmHg Per records, angiogram in 2014 showed 90% mid LAD lesion, 80% OM, 60% RCA s/p DES to mid LAD 2014.     Past Medical History:  Diagnosis Date   Chronic kidney disease    Coronary artery disease    MI, pci to mid LAD 2014. LHC 80%OM, 60%RCA, 90% LAD   Diabetes mellitus without complication (HCC)    Hypertension    Sleep apnea     Past Surgical History:  Procedure Laterality Date   ACNE CYST REMOVAL  2008   Groin area   CARDIAC CATHETERIZATION     COLONOSCOPY WITH PROPOFOL N/A 05/27/2020   Procedure: COLONOSCOPY WITH PROPOFOL;  Surgeon: [name], MD;  Location: [facility] ENDOSCOPY;  Service: Gastroenterology;  Laterality: N/A;   CORONARY ANGIOPLASTY      Current Medications: Current Meds  Medication Sig   acetaminophen (TYLENOL) 650 MG CR tablet  Take 650 mg by mouth 3 (three) times daily as needed.   amLODipine (NORVASC) 5 MG tablet Take 1 tablet (5 mg total) by mouth daily.   aspirin EC 81 MG tablet Take 81 mg by mouth daily.   atorvastatin (LIPITOR) 40 MG tablet Take 1 tablet (40 mg total) by mouth daily. Qhs   carvedilol (COREG) 25 MG tablet TAKE 1 TABLET(25 MG) BY MOUTH TWICE DAILY   Cholecalciferol (VITAMIN D3 PO) Take 1 capsule by mouth daily.   Continuous Blood Gluc Sensor (FREESTYLE LIBRE 2 SENSOR) MISC Apply 1 each topically every 14 (fourteen) days.   Empagliflozin-linaGLIPtin (GLYXAMBI) 10-5 MG TABS Take 1 tablet by mouth daily.   furosemide (LASIX) 40 MG tablet Take 1 tablet (40 mg total) by mouth in the morning and at bedtime. (Patient taking differently: Take 40 mg by mouth daily.)   insulin lispro (HUMALOG KWIKPEN) 100 UNIT/ML KwikPen Inject 8 Units into the skin 3 (three) times daily. (Patient taking differently: Inject 8 Units into the skin daily with supper.)   Insulin Pen Needle 32G X 4 MM MISC 1 Device by Does not apply route 4 (four) times daily - after meals and at bedtime.   latanoprost (XALATAN) 0.005 % ophthalmic solution Place 1 drop into the left eye at bedtime.   losartan (COZAAR) 100 MG tablet Take 100 mg by mouth daily.   ONETOUCH ULTRA test strip USE TO CHECK BLOOD SUGAR THREE TIMES DAILY   TRESIBA FLEXTOUCH 200 UNIT/ML FlexTouch Pen INJECT 56  UNITS INTO THE SKIN DAILY (Patient taking differently: 32 Units.)   Turmeric (QC TUMERIC COMPLEX PO) Take 1 capsule by mouth daily.     Allergies:   Amoxicillin   Social History   Socioeconomic History   Marital status: Single    Spouse name: Not on file   Number of children: Not on file   Years of education: Not on file   Highest education level: Not on file  Occupational History   Not on file  Tobacco Use   Smoking status: Never   Smokeless tobacco: Never  Vaping Use   Vaping Use: Never used  Substance and Sexual Activity   Alcohol use: Never   Drug  use: Never   Sexual activity: Not on file  Other Topics Concern   Not on file  Social History Narrative   Used to live in [place] Determinants of Health   Financial Resource Strain: Low Risk  (02/23/2022)   Overall Financial Resource Strain (CARDIA)    Difficulty of Paying Living Expenses: Not hard at all  Food Insecurity: No Food Insecurity (02/23/2022)   Hunger Vital Sign    Worried About Running Out of Food in the Last Year: Never true    Ran Out of Food in the Last Year: Never true  Transportation Needs: No Transportation Needs (02/23/2022)   PRAPARE - Hydrologist (Medical): No    Lack of Transportation (Non-Medical): No  Physical Activity: Not on file  Stress: No Stress Concern Present (02/23/2022)   [place]    Feeling of Stress : Not at all  Social Connections: Unknown (02/23/2022)   Social Connection and Isolation Panel [NHANES]    Frequency of Communication with Friends and Family: More than three times a week    Frequency of Social Gatherings with Friends and Family: More than three times a week    Attends Religious Services: More than 4 times per year    Active Member of [employer] or Organizations: Yes    Attends Archivist Meetings: Not on file    Marital Status: Not on file     Family History: The patient's family history includes Diabetes in her brother, brother, mother, and sister; Heart disease in her father. There is no history of Breast cancer.  ROS:   Please see the history of present illness.     All other systems reviewed and are negative.  EKGs/Labs/Other Studies Reviewed:    The following studies were reviewed today:  EKG:  EKG is ordered today. Normal sinus rhythm, first-degree AV block.  Recent Labs: 09/27/2021: ALT 7; BUN 19; Creatinine, Ser 1.37; Hemoglobin 12.1; Platelets 204.0; Potassium 4.1; Sodium 144; TSH 1.59   Recent Lipid Panel    Component Value Date/Time   CHOL 98 09/27/2021 0941   TRIG 45.0 09/27/2021 0941   HDL 31.10 (L) 09/27/2021 0941   CHOLHDL 3 09/27/2021 0941   VLDL 9.0 09/27/2021 0941   LDLCALC 58 09/27/2021 0941   LDLDIRECT 49.0 06/07/2021 1440    Physical Exam:    VS:  BP (!) 148/70 (BP Location: Left Arm, Patient Position: Sitting, Cuff Size: Normal)   Pulse 69   Ht '5\' 4"'$  (1.626 m)   Wt 193 lb 2 oz (87.6 kg)   LMP  (LMP Unknown)   SpO2 94%   BMI 33.15 kg/m     Wt Readings from Last 3  Encounters:  04/07/22 193 lb 2 oz (87.6 kg)  04/03/22 192 lb 9.6 oz (87.4 kg)  03/21/22 195 lb 6.4 oz (88.6 kg)     GEN:  Well nourished, well developed in no acute distress HEENT: Normal NECK: No JVD; No carotid bruits CARDIAC: RRR, no murmurs, rubs, gallops RESPIRATORY: Diminished breath sounds at bases ABDOMEN: Soft, non-tender, non-distended MUSCULOSKELETAL: 1+ pitting edema; No deformity  SKIN: Warm and dry NEUROLOGIC:  Alert and oriented x 3 PSYCHIATRIC:  Normal affect   ASSESSMENT:    1. Diastolic dysfunction   2. Pulmonary HTN ([place])   3. Coronary artery disease involving native coronary artery of native heart without angina pectoris   4. Primary hypertension     PLAN:    Grade 2 diastolic dysfunction, appears euvolemic, cont lasix '40mg'$  qd. Etiology multifactoria, including cad, untreated OSA. History of severe pulmonary hypertension with PASP of 14mHg.  Etiology  likely multifactorial (OSA-hypoxia pathway, diastolic dysfunction (LVDD-PH)).  May have ILD as per pulmonary function test.  Pulmonary hypertension eval and pulmonary medicine eval referral recommended but patient again declines.   CAD status post PCI to mid LAD 2014.  Denies chest pain continue aspirin, Lipitor. hypertension.  Continue Coreg, losartan, amlodipine  Follow-up in 1 year.  Total encounter time 40 minutes  Greater than 50% was spent in counseling and coordination of care with the  patient   Medication Adjustments/Labs and Tests Ordered: Current medicines are reviewed at length with the patient today.  Concerns regarding medicines are outlined above.  Orders Placed This Encounter  Procedures   EKG 12-Lead    No orders of the defined types were placed in this encounter.    Patient Instructions  Medication Instructions:   Your physician recommends that you continue on your current medications as directed. Please refer to the Current Medication list given to you today.   *If you need a refill on your cardiac medications before your next appointment, please call your pharmacy*   Follow-Up: [facility] and your health needs are our priority.  As part of our continuing mission to provide you with exceptional heart care, we have created designated Provider Care Teams.  These Care Teams include your primary Cardiologist (physician) and Advanced Practice Providers (APPs -  Physician Assistants and Nurse Practitioners) who all work together to provide you with the care you need, when you need it.  We recommend signing up for the patient portal called "MyChart".  Sign up information is provided on this After Visit Summary.  MyChart is used to connect with patients for Virtual Visits (Telemedicine).  Patients are able to view lab/test results, encounter notes, upcoming appointments, etc.  Non-urgent messages can be sent to your provider as well.   To learn more about what you can do with MyChart, go to [email or web address]    Your next appointment:   1 year(s)  The format for your next appointment:   In Person  Provider:   BKate Sable MD    Other Instructions   Important Information About Sugar         Signed, BKate Sable MD  04/07/2022 3:17 PM    [place]

## 2022-04-09 ENCOUNTER — Encounter (INDEPENDENT_AMBULATORY_CARE_PROVIDER_SITE_OTHER): Payer: Self-pay | Admitting: Vascular Surgery

## 2022-04-10 ENCOUNTER — Other Ambulatory Visit: Payer: Self-pay | Admitting: Internal Medicine

## 2022-04-10 ENCOUNTER — Encounter: Payer: Self-pay | Admitting: Internal Medicine

## 2022-04-10 DIAGNOSIS — E1159 Type 2 diabetes mellitus with other circulatory complications: Secondary | ICD-10-CM

## 2022-04-10 MED ORDER — TRESIBA FLEXTOUCH 200 UNIT/ML ~~LOC~~ SOPN
32.0000 [IU] | PEN_INJECTOR | Freq: Every day | SUBCUTANEOUS | 11 refills | Status: DC
Start: 2022-04-10 — End: 2022-11-21

## 2022-04-11 ENCOUNTER — Encounter (INDEPENDENT_AMBULATORY_CARE_PROVIDER_SITE_OTHER): Payer: Medicare Other

## 2022-04-11 DIAGNOSIS — I10 Essential (primary) hypertension: Secondary | ICD-10-CM | POA: Diagnosis not present

## 2022-04-11 DIAGNOSIS — Z7982 Long term (current) use of aspirin: Secondary | ICD-10-CM | POA: Diagnosis not present

## 2022-04-11 DIAGNOSIS — I700 Atherosclerosis of aorta: Secondary | ICD-10-CM | POA: Diagnosis not present

## 2022-04-11 DIAGNOSIS — E1122 Type 2 diabetes mellitus with diabetic chronic kidney disease: Secondary | ICD-10-CM | POA: Diagnosis not present

## 2022-04-11 DIAGNOSIS — M4316 Spondylolisthesis, lumbar region: Secondary | ICD-10-CM | POA: Diagnosis not present

## 2022-04-11 DIAGNOSIS — E782 Mixed hyperlipidemia: Secondary | ICD-10-CM | POA: Diagnosis not present

## 2022-04-11 DIAGNOSIS — N1832 Chronic kidney disease, stage 3b: Secondary | ICD-10-CM | POA: Diagnosis not present

## 2022-04-11 DIAGNOSIS — Z794 Long term (current) use of insulin: Secondary | ICD-10-CM | POA: Diagnosis not present

## 2022-04-11 DIAGNOSIS — I890 Lymphedema, not elsewhere classified: Secondary | ICD-10-CM | POA: Diagnosis not present

## 2022-04-13 DIAGNOSIS — I10 Essential (primary) hypertension: Secondary | ICD-10-CM | POA: Diagnosis not present

## 2022-04-13 DIAGNOSIS — Z794 Long term (current) use of insulin: Secondary | ICD-10-CM | POA: Diagnosis not present

## 2022-04-13 DIAGNOSIS — I890 Lymphedema, not elsewhere classified: Secondary | ICD-10-CM | POA: Diagnosis not present

## 2022-04-13 DIAGNOSIS — I700 Atherosclerosis of aorta: Secondary | ICD-10-CM | POA: Diagnosis not present

## 2022-04-13 DIAGNOSIS — N1832 Chronic kidney disease, stage 3b: Secondary | ICD-10-CM | POA: Diagnosis not present

## 2022-04-13 DIAGNOSIS — M4316 Spondylolisthesis, lumbar region: Secondary | ICD-10-CM | POA: Diagnosis not present

## 2022-04-13 DIAGNOSIS — Z7982 Long term (current) use of aspirin: Secondary | ICD-10-CM | POA: Diagnosis not present

## 2022-04-13 DIAGNOSIS — E1122 Type 2 diabetes mellitus with diabetic chronic kidney disease: Secondary | ICD-10-CM | POA: Diagnosis not present

## 2022-04-13 DIAGNOSIS — E782 Mixed hyperlipidemia: Secondary | ICD-10-CM | POA: Diagnosis not present

## 2022-04-14 DIAGNOSIS — E782 Mixed hyperlipidemia: Secondary | ICD-10-CM | POA: Diagnosis not present

## 2022-04-14 DIAGNOSIS — I700 Atherosclerosis of aorta: Secondary | ICD-10-CM | POA: Diagnosis not present

## 2022-04-14 DIAGNOSIS — M4316 Spondylolisthesis, lumbar region: Secondary | ICD-10-CM | POA: Diagnosis not present

## 2022-04-14 DIAGNOSIS — N1832 Chronic kidney disease, stage 3b: Secondary | ICD-10-CM | POA: Diagnosis not present

## 2022-04-14 DIAGNOSIS — Z7982 Long term (current) use of aspirin: Secondary | ICD-10-CM | POA: Diagnosis not present

## 2022-04-14 DIAGNOSIS — I10 Essential (primary) hypertension: Secondary | ICD-10-CM | POA: Diagnosis not present

## 2022-04-14 DIAGNOSIS — E1122 Type 2 diabetes mellitus with diabetic chronic kidney disease: Secondary | ICD-10-CM | POA: Diagnosis not present

## 2022-04-14 DIAGNOSIS — Z794 Long term (current) use of insulin: Secondary | ICD-10-CM | POA: Diagnosis not present

## 2022-04-14 DIAGNOSIS — I890 Lymphedema, not elsewhere classified: Secondary | ICD-10-CM | POA: Diagnosis not present

## 2022-04-18 ENCOUNTER — Telehealth (INDEPENDENT_AMBULATORY_CARE_PROVIDER_SITE_OTHER): Payer: Self-pay

## 2022-04-18 ENCOUNTER — Encounter (INDEPENDENT_AMBULATORY_CARE_PROVIDER_SITE_OTHER): Payer: Medicare Other

## 2022-04-18 DIAGNOSIS — E1122 Type 2 diabetes mellitus with diabetic chronic kidney disease: Secondary | ICD-10-CM | POA: Diagnosis not present

## 2022-04-18 DIAGNOSIS — I700 Atherosclerosis of aorta: Secondary | ICD-10-CM | POA: Diagnosis not present

## 2022-04-18 DIAGNOSIS — N1832 Chronic kidney disease, stage 3b: Secondary | ICD-10-CM | POA: Diagnosis not present

## 2022-04-18 DIAGNOSIS — M4316 Spondylolisthesis, lumbar region: Secondary | ICD-10-CM | POA: Diagnosis not present

## 2022-04-18 DIAGNOSIS — Z794 Long term (current) use of insulin: Secondary | ICD-10-CM | POA: Diagnosis not present

## 2022-04-18 DIAGNOSIS — I10 Essential (primary) hypertension: Secondary | ICD-10-CM | POA: Diagnosis not present

## 2022-04-18 DIAGNOSIS — Z7982 Long term (current) use of aspirin: Secondary | ICD-10-CM | POA: Diagnosis not present

## 2022-04-18 DIAGNOSIS — I890 Lymphedema, not elsewhere classified: Secondary | ICD-10-CM | POA: Diagnosis not present

## 2022-04-18 DIAGNOSIS — E782 Mixed hyperlipidemia: Secondary | ICD-10-CM | POA: Diagnosis not present

## 2022-04-18 NOTE — Telephone Encounter (Signed)
Eric from [place] LVM for April about getting an order for OT.  Eric phone number is [phone].

## 2022-04-18 NOTE — Telephone Encounter (Signed)
Verbal orders request is fine per Arna Medici NP and I left a detailed message on Randall Hiss from [employer].

## 2022-04-19 DIAGNOSIS — I10 Essential (primary) hypertension: Secondary | ICD-10-CM | POA: Diagnosis not present

## 2022-04-19 DIAGNOSIS — Z794 Long term (current) use of insulin: Secondary | ICD-10-CM | POA: Diagnosis not present

## 2022-04-19 DIAGNOSIS — Z7982 Long term (current) use of aspirin: Secondary | ICD-10-CM | POA: Diagnosis not present

## 2022-04-19 DIAGNOSIS — M4316 Spondylolisthesis, lumbar region: Secondary | ICD-10-CM | POA: Diagnosis not present

## 2022-04-19 DIAGNOSIS — I700 Atherosclerosis of aorta: Secondary | ICD-10-CM | POA: Diagnosis not present

## 2022-04-19 DIAGNOSIS — E782 Mixed hyperlipidemia: Secondary | ICD-10-CM | POA: Diagnosis not present

## 2022-04-19 DIAGNOSIS — N1832 Chronic kidney disease, stage 3b: Secondary | ICD-10-CM | POA: Diagnosis not present

## 2022-04-19 DIAGNOSIS — I890 Lymphedema, not elsewhere classified: Secondary | ICD-10-CM | POA: Diagnosis not present

## 2022-04-19 DIAGNOSIS — E1122 Type 2 diabetes mellitus with diabetic chronic kidney disease: Secondary | ICD-10-CM | POA: Diagnosis not present

## 2022-04-20 ENCOUNTER — Other Ambulatory Visit: Payer: Self-pay

## 2022-04-20 DIAGNOSIS — N1832 Chronic kidney disease, stage 3b: Secondary | ICD-10-CM | POA: Diagnosis not present

## 2022-04-20 DIAGNOSIS — M4316 Spondylolisthesis, lumbar region: Secondary | ICD-10-CM | POA: Diagnosis not present

## 2022-04-20 DIAGNOSIS — E1122 Type 2 diabetes mellitus with diabetic chronic kidney disease: Secondary | ICD-10-CM | POA: Diagnosis not present

## 2022-04-20 DIAGNOSIS — Z7982 Long term (current) use of aspirin: Secondary | ICD-10-CM | POA: Diagnosis not present

## 2022-04-20 DIAGNOSIS — I10 Essential (primary) hypertension: Secondary | ICD-10-CM | POA: Diagnosis not present

## 2022-04-20 DIAGNOSIS — I700 Atherosclerosis of aorta: Secondary | ICD-10-CM | POA: Diagnosis not present

## 2022-04-20 DIAGNOSIS — I890 Lymphedema, not elsewhere classified: Secondary | ICD-10-CM | POA: Diagnosis not present

## 2022-04-20 DIAGNOSIS — Z794 Long term (current) use of insulin: Secondary | ICD-10-CM | POA: Diagnosis not present

## 2022-04-20 DIAGNOSIS — E782 Mixed hyperlipidemia: Secondary | ICD-10-CM | POA: Diagnosis not present

## 2022-04-20 MED ORDER — AMLODIPINE BESYLATE 5 MG PO TABS: 5.0000 mg | ORAL_TABLET | Freq: Every day | ORAL | 3 refills | Status: DC

## 2022-04-21 DIAGNOSIS — N1832 Chronic kidney disease, stage 3b: Secondary | ICD-10-CM | POA: Diagnosis not present

## 2022-04-21 DIAGNOSIS — E782 Mixed hyperlipidemia: Secondary | ICD-10-CM | POA: Diagnosis not present

## 2022-04-21 DIAGNOSIS — I890 Lymphedema, not elsewhere classified: Secondary | ICD-10-CM | POA: Diagnosis not present

## 2022-04-21 DIAGNOSIS — I700 Atherosclerosis of aorta: Secondary | ICD-10-CM | POA: Diagnosis not present

## 2022-04-21 DIAGNOSIS — Z794 Long term (current) use of insulin: Secondary | ICD-10-CM | POA: Diagnosis not present

## 2022-04-21 DIAGNOSIS — Z7982 Long term (current) use of aspirin: Secondary | ICD-10-CM | POA: Diagnosis not present

## 2022-04-21 DIAGNOSIS — I10 Essential (primary) hypertension: Secondary | ICD-10-CM | POA: Diagnosis not present

## 2022-04-21 DIAGNOSIS — M4316 Spondylolisthesis, lumbar region: Secondary | ICD-10-CM | POA: Diagnosis not present

## 2022-04-21 DIAGNOSIS — E1122 Type 2 diabetes mellitus with diabetic chronic kidney disease: Secondary | ICD-10-CM | POA: Diagnosis not present

## 2022-04-25 ENCOUNTER — Encounter (INDEPENDENT_AMBULATORY_CARE_PROVIDER_SITE_OTHER): Payer: Medicare Other

## 2022-04-26 DIAGNOSIS — Z794 Long term (current) use of insulin: Secondary | ICD-10-CM | POA: Diagnosis not present

## 2022-04-26 DIAGNOSIS — I700 Atherosclerosis of aorta: Secondary | ICD-10-CM | POA: Diagnosis not present

## 2022-04-26 DIAGNOSIS — E782 Mixed hyperlipidemia: Secondary | ICD-10-CM | POA: Diagnosis not present

## 2022-04-26 DIAGNOSIS — N1832 Chronic kidney disease, stage 3b: Secondary | ICD-10-CM | POA: Diagnosis not present

## 2022-04-26 DIAGNOSIS — E1122 Type 2 diabetes mellitus with diabetic chronic kidney disease: Secondary | ICD-10-CM | POA: Diagnosis not present

## 2022-04-26 DIAGNOSIS — Z7982 Long term (current) use of aspirin: Secondary | ICD-10-CM | POA: Diagnosis not present

## 2022-04-26 DIAGNOSIS — M4316 Spondylolisthesis, lumbar region: Secondary | ICD-10-CM | POA: Diagnosis not present

## 2022-04-26 DIAGNOSIS — I10 Essential (primary) hypertension: Secondary | ICD-10-CM | POA: Diagnosis not present

## 2022-04-26 DIAGNOSIS — I890 Lymphedema, not elsewhere classified: Secondary | ICD-10-CM | POA: Diagnosis not present

## 2022-04-27 ENCOUNTER — Other Ambulatory Visit: Payer: Self-pay

## 2022-04-27 ENCOUNTER — Encounter: Payer: Self-pay | Admitting: Internal Medicine

## 2022-04-27 DIAGNOSIS — E782 Mixed hyperlipidemia: Secondary | ICD-10-CM | POA: Diagnosis not present

## 2022-04-27 DIAGNOSIS — Z7982 Long term (current) use of aspirin: Secondary | ICD-10-CM | POA: Diagnosis not present

## 2022-04-27 DIAGNOSIS — E1122 Type 2 diabetes mellitus with diabetic chronic kidney disease: Secondary | ICD-10-CM | POA: Diagnosis not present

## 2022-04-27 DIAGNOSIS — M4316 Spondylolisthesis, lumbar region: Secondary | ICD-10-CM | POA: Diagnosis not present

## 2022-04-27 DIAGNOSIS — Z794 Long term (current) use of insulin: Secondary | ICD-10-CM | POA: Diagnosis not present

## 2022-04-27 DIAGNOSIS — I890 Lymphedema, not elsewhere classified: Secondary | ICD-10-CM | POA: Diagnosis not present

## 2022-04-27 DIAGNOSIS — I700 Atherosclerosis of aorta: Secondary | ICD-10-CM | POA: Diagnosis not present

## 2022-04-27 DIAGNOSIS — I10 Essential (primary) hypertension: Secondary | ICD-10-CM | POA: Diagnosis not present

## 2022-04-27 DIAGNOSIS — N1832 Chronic kidney disease, stage 3b: Secondary | ICD-10-CM | POA: Diagnosis not present

## 2022-04-28 DIAGNOSIS — N1832 Chronic kidney disease, stage 3b: Secondary | ICD-10-CM | POA: Diagnosis not present

## 2022-04-28 DIAGNOSIS — I890 Lymphedema, not elsewhere classified: Secondary | ICD-10-CM | POA: Diagnosis not present

## 2022-04-28 DIAGNOSIS — Z7982 Long term (current) use of aspirin: Secondary | ICD-10-CM | POA: Diagnosis not present

## 2022-04-28 DIAGNOSIS — E1122 Type 2 diabetes mellitus with diabetic chronic kidney disease: Secondary | ICD-10-CM | POA: Diagnosis not present

## 2022-04-28 DIAGNOSIS — Z794 Long term (current) use of insulin: Secondary | ICD-10-CM | POA: Diagnosis not present

## 2022-04-28 DIAGNOSIS — I700 Atherosclerosis of aorta: Secondary | ICD-10-CM | POA: Diagnosis not present

## 2022-04-28 DIAGNOSIS — I10 Essential (primary) hypertension: Secondary | ICD-10-CM | POA: Diagnosis not present

## 2022-04-28 DIAGNOSIS — M4316 Spondylolisthesis, lumbar region: Secondary | ICD-10-CM | POA: Diagnosis not present

## 2022-04-28 DIAGNOSIS — E782 Mixed hyperlipidemia: Secondary | ICD-10-CM | POA: Diagnosis not present

## 2022-05-01 ENCOUNTER — Other Ambulatory Visit: Payer: Self-pay

## 2022-05-01 DIAGNOSIS — I152 Hypertension secondary to endocrine disorders: Secondary | ICD-10-CM

## 2022-05-01 MED ORDER — INSULIN PEN NEEDLE 32G X 4 MM MISC: 1.0000 | Freq: Three times a day (TID) | 3 refills | Status: DC

## 2022-05-02 ENCOUNTER — Encounter (INDEPENDENT_AMBULATORY_CARE_PROVIDER_SITE_OTHER): Payer: Medicare Other

## 2022-05-02 DIAGNOSIS — I700 Atherosclerosis of aorta: Secondary | ICD-10-CM | POA: Diagnosis not present

## 2022-05-02 DIAGNOSIS — Z794 Long term (current) use of insulin: Secondary | ICD-10-CM | POA: Diagnosis not present

## 2022-05-02 DIAGNOSIS — M4316 Spondylolisthesis, lumbar region: Secondary | ICD-10-CM | POA: Diagnosis not present

## 2022-05-02 DIAGNOSIS — E1122 Type 2 diabetes mellitus with diabetic chronic kidney disease: Secondary | ICD-10-CM | POA: Diagnosis not present

## 2022-05-02 DIAGNOSIS — Z7982 Long term (current) use of aspirin: Secondary | ICD-10-CM | POA: Diagnosis not present

## 2022-05-02 DIAGNOSIS — N1832 Chronic kidney disease, stage 3b: Secondary | ICD-10-CM | POA: Diagnosis not present

## 2022-05-02 DIAGNOSIS — E782 Mixed hyperlipidemia: Secondary | ICD-10-CM | POA: Diagnosis not present

## 2022-05-02 DIAGNOSIS — I890 Lymphedema, not elsewhere classified: Secondary | ICD-10-CM | POA: Diagnosis not present

## 2022-05-02 DIAGNOSIS — I10 Essential (primary) hypertension: Secondary | ICD-10-CM | POA: Diagnosis not present

## 2022-05-04 DIAGNOSIS — I890 Lymphedema, not elsewhere classified: Secondary | ICD-10-CM | POA: Diagnosis not present

## 2022-05-04 DIAGNOSIS — H401112 Primary open-angle glaucoma, right eye, moderate stage: Secondary | ICD-10-CM | POA: Diagnosis not present

## 2022-05-04 DIAGNOSIS — M4316 Spondylolisthesis, lumbar region: Secondary | ICD-10-CM | POA: Diagnosis not present

## 2022-05-04 DIAGNOSIS — Z7982 Long term (current) use of aspirin: Secondary | ICD-10-CM | POA: Diagnosis not present

## 2022-05-04 DIAGNOSIS — I700 Atherosclerosis of aorta: Secondary | ICD-10-CM | POA: Diagnosis not present

## 2022-05-04 DIAGNOSIS — N1832 Chronic kidney disease, stage 3b: Secondary | ICD-10-CM | POA: Diagnosis not present

## 2022-05-04 DIAGNOSIS — Z794 Long term (current) use of insulin: Secondary | ICD-10-CM | POA: Diagnosis not present

## 2022-05-04 DIAGNOSIS — E782 Mixed hyperlipidemia: Secondary | ICD-10-CM | POA: Diagnosis not present

## 2022-05-04 DIAGNOSIS — E1122 Type 2 diabetes mellitus with diabetic chronic kidney disease: Secondary | ICD-10-CM | POA: Diagnosis not present

## 2022-05-04 DIAGNOSIS — I10 Essential (primary) hypertension: Secondary | ICD-10-CM | POA: Diagnosis not present

## 2022-05-08 ENCOUNTER — Encounter (INDEPENDENT_AMBULATORY_CARE_PROVIDER_SITE_OTHER): Payer: Self-pay | Admitting: Vascular Surgery

## 2022-05-08 ENCOUNTER — Ambulatory Visit (INDEPENDENT_AMBULATORY_CARE_PROVIDER_SITE_OTHER): Payer: Medicare Other | Admitting: Vascular Surgery

## 2022-05-08 VITALS — BP 116/66 | HR 66 | Resp 16 | Ht 64.0 in | Wt 189.0 lb

## 2022-05-08 DIAGNOSIS — I25119 Atherosclerotic heart disease of native coronary artery with unspecified angina pectoris: Secondary | ICD-10-CM | POA: Diagnosis not present

## 2022-05-08 DIAGNOSIS — I872 Venous insufficiency (chronic) (peripheral): Secondary | ICD-10-CM | POA: Diagnosis not present

## 2022-05-08 DIAGNOSIS — E1159 Type 2 diabetes mellitus with other circulatory complications: Secondary | ICD-10-CM | POA: Diagnosis not present

## 2022-05-08 DIAGNOSIS — I10 Essential (primary) hypertension: Secondary | ICD-10-CM

## 2022-05-08 DIAGNOSIS — I890 Lymphedema, not elsewhere classified: Secondary | ICD-10-CM | POA: Diagnosis not present

## 2022-05-09 ENCOUNTER — Encounter (INDEPENDENT_AMBULATORY_CARE_PROVIDER_SITE_OTHER): Payer: Self-pay | Admitting: Vascular Surgery

## 2022-05-09 DIAGNOSIS — Z7982 Long term (current) use of aspirin: Secondary | ICD-10-CM | POA: Diagnosis not present

## 2022-05-09 DIAGNOSIS — N1832 Chronic kidney disease, stage 3b: Secondary | ICD-10-CM | POA: Diagnosis not present

## 2022-05-09 DIAGNOSIS — I700 Atherosclerosis of aorta: Secondary | ICD-10-CM | POA: Diagnosis not present

## 2022-05-09 DIAGNOSIS — I890 Lymphedema, not elsewhere classified: Secondary | ICD-10-CM | POA: Diagnosis not present

## 2022-05-09 DIAGNOSIS — Z794 Long term (current) use of insulin: Secondary | ICD-10-CM | POA: Diagnosis not present

## 2022-05-09 DIAGNOSIS — E782 Mixed hyperlipidemia: Secondary | ICD-10-CM | POA: Diagnosis not present

## 2022-05-09 DIAGNOSIS — E1122 Type 2 diabetes mellitus with diabetic chronic kidney disease: Secondary | ICD-10-CM | POA: Diagnosis not present

## 2022-05-09 DIAGNOSIS — M4316 Spondylolisthesis, lumbar region: Secondary | ICD-10-CM | POA: Diagnosis not present

## 2022-05-09 DIAGNOSIS — I10 Essential (primary) hypertension: Secondary | ICD-10-CM | POA: Diagnosis not present

## 2022-05-09 NOTE — Progress Notes (Signed)
MRN : [id number]  Vickie Kemp is a 81 y.o. ([birth year]) female who presents with chief complaint of legs hurt and swell.  History of Present Illness:   Patient is seen for follow up evaluation of leg pain and swelling associated with venous ulceration. The patient was recently seen here and started on Unna boot therapy.  With the Unna boots the pain and swelling has been much better.  No significant drainage or staining of the Unna boots.  The patient notes that an ulcer developed acutely without specific trauma and it has been very slow to heal.  The patient notes the Unna boots have been helpful.  The patient states that they have been elevating as much as possible. The patient denies any recent changes in medications.  The patient denies a history of DVT or PE. There is no prior history of phlebitis. There is no history of primary lymphedema.  No SOB or increased cough.  No sputum production.  No recent episodes of CHF exacerbation.   Current Meds  Medication Sig   acetaminophen (TYLENOL) 650 MG CR tablet Take 650 mg by mouth 3 (three) times daily as needed.   amLODipine (NORVASC) 5 MG tablet Take 1 tablet (5 mg total) by mouth daily.   aspirin EC 81 MG tablet Take 81 mg by mouth daily.   atorvastatin (LIPITOR) 40 MG tablet Take 1 tablet (40 mg total) by mouth daily. Qhs   carvedilol (COREG) 25 MG tablet TAKE 1 TABLET(25 MG) BY MOUTH TWICE DAILY   Cholecalciferol (VITAMIN D3 PO) Take 1 capsule by mouth daily.   Continuous Blood Gluc Sensor (FREESTYLE LIBRE 2 SENSOR) MISC Apply 1 each topically every 14 (fourteen) days.   Empagliflozin-linaGLIPtin (GLYXAMBI) 10-5 MG TABS Take 1 tablet by mouth daily.   furosemide (LASIX) 40 MG tablet Take 1 tablet (40 mg total) by mouth in the morning and at bedtime. (Patient taking differently: Take 40 mg by mouth daily.)   insulin degludec (TRESIBA FLEXTOUCH) 200 UNIT/ML FlexTouch Pen Inject 32 Units into the skin daily. D/c 56 units    insulin lispro (HUMALOG KWIKPEN) 100 UNIT/ML KwikPen Inject 8 Units into the skin 3 (three) times daily. (Patient taking differently: Inject 8 Units into the skin daily with supper.)   Insulin Pen Needle 32G X 4 MM MISC 1 Device by Does not apply route 4 (four) times daily - after meals and at bedtime.   latanoprost (XALATAN) 0.005 % ophthalmic solution Place 1 drop into the left eye at bedtime.   losartan (COZAAR) 100 MG tablet Take 100 mg by mouth daily.   ONETOUCH ULTRA test strip USE TO CHECK BLOOD SUGAR THREE TIMES DAILY   Turmeric (QC TUMERIC COMPLEX PO) Take 1 capsule by mouth daily.    Past Medical History:  Diagnosis Date   Chronic kidney disease    Coronary artery disease    MI, pci to mid LAD 2014. LHC 80%OM, 60%RCA, 90% LAD   Diabetes mellitus without complication (HCC)    Hypertension    Sleep apnea     Past Surgical History:  Procedure Laterality Date   ACNE CYST REMOVAL  2008   Groin area   CARDIAC CATHETERIZATION     COLONOSCOPY WITH PROPOFOL N/A 05/27/2020   Procedure: COLONOSCOPY WITH PROPOFOL;  Surgeon: [name], MD;  Location: [facility] ENDOSCOPY;  Service: Gastroenterology;  Laterality: N/A;   CORONARY ANGIOPLASTY      Social History Social History  Tobacco Use   Smoking status: Never   Smokeless tobacco: Never  Vaping Use   Vaping Use: Never used  Substance Use Topics   Alcohol use: Never   Drug use: Never    Family History Family History  Problem Relation Age of Onset   Diabetes Mother    Heart disease Father    Diabetes Sister    Diabetes Brother    Diabetes Brother    Breast cancer Neg Hx     Allergies  Allergen Reactions   Amoxicillin Itching     REVIEW OF SYSTEMS (Negative unless checked)  Constitutional: '[]'$ Weight loss  '[]'$ Fever  '[]'$ Chills Cardiac: '[]'$ Chest pain   '[]'$ Chest pressure   '[]'$ Palpitations   '[]'$ Shortness of breath when laying flat   '[]'$ Shortness of breath with exertion. Vascular:  '[]'$ Pain in legs with walking    '[x]'$ Pain in legs at rest  '[]'$ History of DVT   '[]'$ Phlebitis   '[x]'$ Swelling in legs   '[]'$ Varicose veins   '[]'$ Non-healing ulcers Pulmonary:   '[]'$ Uses home oxygen   '[]'$ Productive cough   '[]'$ Hemoptysis   '[]'$ Wheeze  '[]'$ COPD   '[]'$ Asthma Neurologic:  '[]'$ Dizziness   '[]'$ Seizures   '[]'$ History of stroke   '[]'$ History of TIA  '[]'$ Aphasia   '[]'$ Vissual changes   '[]'$ Weakness or numbness in arm   '[]'$ Weakness or numbness in leg Musculoskeletal:   '[]'$ Joint swelling   '[]'$ Joint pain   '[]'$ Low back pain Hematologic:  '[]'$ Easy bruising  '[]'$ Easy bleeding   '[]'$ Hypercoagulable state   '[]'$ Anemic Gastrointestinal:  '[]'$ Diarrhea   '[]'$ Vomiting  '[]'$ Gastroesophageal reflux/heartburn   '[]'$ Difficulty swallowing. Genitourinary:  '[]'$ Chronic kidney disease   '[]'$ Difficult urination  '[]'$ Frequent urination   '[]'$ Blood in urine Skin:  '[]'$ Rashes   '[]'$ Ulcers  Psychological:  '[]'$ History of anxiety   '[]'$  History of major depression.  Physical Examination  Vitals:   05/08/22 1337  BP: 116/66  Pulse: 66  Resp: 16  Weight: 189 lb (85.7 kg)  Height: '5\' 4"'$  (1.626 m)   Body mass index is 32.44 kg/m. Gen: WD/WN, NAD Head: [place]/AT, No temporalis wasting.  Ear/Nose/Throat: Hearing grossly intact, nares w/o erythema or drainage, pinna without lesions Eyes: PER, EOMI, sclera nonicteric.  Neck: Supple, no gross masses.  No JVD.  Pulmonary:  Good air movement, no audible wheezing, no use of accessory muscles.  Cardiac: RRR, precordium not hyperdynamic. Vascular:  scattered varicosities present bilaterally.  Moderate to severe venous stasis changes to the legs bilaterally.  Trace edema the ulcers have healed, no open areas no drainage Vessel Right Left  Radial Palpable Palpable  Gastrointestinal: soft, non-distended. No guarding/no peritoneal signs.  Musculoskeletal: M/S 5/5 throughout.  No deformity.  Neurologic: CN 2-12 intact. Pain and light touch intact in extremities.  Symmetrical.  Speech is fluent. Motor exam as listed above. Psychiatric: Judgment intact, Mood & affect  appropriate for pt's clinical situation. Dermatologic: Venous rashes no ulcers noted.  No changes consistent with cellulitis. Lymph : No lichenification or skin changes of chronic lymphedema.  CBC Lab Results  Component Value Date   WBC 8.8 09/27/2021   HGB 12.1 09/27/2021   HCT 37.6 09/27/2021   MCV 88.6 09/27/2021   PLT 204.0 09/27/2021    BMET    Component Value Date/Time   NA 144 09/27/2021 0941   K 4.1 09/27/2021 0941   CL 103 09/27/2021 0941   CO2 32 09/27/2021 0941   GLUCOSE 86 09/27/2021 0941   BUN 19 09/27/2021 0941   CREATININE 1.37 (H) 09/27/2021 0941   CALCIUM 9.6 09/27/2021 0941  GFRNONAA 31 (L) 06/02/2020 1103   GFRAA 37 (L) 04/24/2020 1119   CrCl cannot be calculated (Patient's most recent lab result is older than the maximum 21 days allowed.).  COAG No results found for: "INR", "PROTIME"  Radiology No results found.   Assessment/Plan 1. Chronic venous insufficiency No surgery or intervention at this point in time.  Unna boots can be stopped.  I have discussed with the patient venous insufficiency and why it  causes symptoms. I have discussed with the patient the chronic skin changes that accompany venous insufficiency and the long term sequela such as infection and ulceration.  Patient will begin wearing graduated compression stockings or compression wraps on a daily basis.  The patient will put the compression on first thing in the morning and removing them in the evening. The patient is instructed specifically not to sleep in the compression.    In addition, behavioral modification including several periods of elevation of the lower extremities during the day will be continued. I have demonstrated that proper elevation is a position with the ankles at heart level.  The patient is instructed to begin routine exercise, especially walking on a daily basis  The patient will be assessed for a Lymph Pump depending on the effectiveness of conservative  therapy and the control of the associated lymphedema.   2. Lymphedema No surgery or intervention at this point in time.  Unna boots can be stopped.  I have discussed with the patient venous insufficiency and why it  causes symptoms. I have discussed with the patient the chronic skin changes that accompany venous insufficiency and the long term sequela such as infection and ulceration.  Patient will begin wearing graduated compression stockings or compression wraps on a daily basis.  The patient will put the compression on first thing in the morning and removing them in the evening. The patient is instructed specifically not to sleep in the compression.    In addition, behavioral modification including several periods of elevation of the lower extremities during the day will be continued. I have demonstrated that proper elevation is a position with the ankles at heart level.  The patient is instructed to begin routine exercise, especially walking on a daily basis  The patient will be assessed for a Lymph Pump depending on the effectiveness of conservative therapy and the control of the associated lymphedema.   3. Coronary artery disease involving native heart with angina pectoris, unspecified vessel or lesion type ([place]) Continue cardiac and antihypertensive medications as already ordered and reviewed, no changes at this time.  Continue statin as ordered and reviewed, no changes at this time  Nitrates PRN for chest pain   4. Essential (primary) hypertension Continue antihypertensive medications as already ordered, these medications have been reviewed and there are no changes at this time.   5. Type 2 diabetes mellitus with vascular disease ([place]) Continue hypoglycemic medications as already ordered, these medications have been reviewed and there are no changes at this time.  Hgb A1C to be monitored as already arranged by primary service     Hortencia Pilar, MD  05/09/2022 12:18 PM

## 2022-05-11 DIAGNOSIS — E782 Mixed hyperlipidemia: Secondary | ICD-10-CM | POA: Diagnosis not present

## 2022-05-11 DIAGNOSIS — I10 Essential (primary) hypertension: Secondary | ICD-10-CM | POA: Diagnosis not present

## 2022-05-11 DIAGNOSIS — M4316 Spondylolisthesis, lumbar region: Secondary | ICD-10-CM | POA: Diagnosis not present

## 2022-05-11 DIAGNOSIS — E1122 Type 2 diabetes mellitus with diabetic chronic kidney disease: Secondary | ICD-10-CM | POA: Diagnosis not present

## 2022-05-11 DIAGNOSIS — N1832 Chronic kidney disease, stage 3b: Secondary | ICD-10-CM | POA: Diagnosis not present

## 2022-05-11 DIAGNOSIS — I700 Atherosclerosis of aorta: Secondary | ICD-10-CM | POA: Diagnosis not present

## 2022-05-11 DIAGNOSIS — Z7982 Long term (current) use of aspirin: Secondary | ICD-10-CM | POA: Diagnosis not present

## 2022-05-11 DIAGNOSIS — Z794 Long term (current) use of insulin: Secondary | ICD-10-CM | POA: Diagnosis not present

## 2022-05-11 DIAGNOSIS — I890 Lymphedema, not elsewhere classified: Secondary | ICD-10-CM | POA: Diagnosis not present

## 2022-05-16 DIAGNOSIS — N1832 Chronic kidney disease, stage 3b: Secondary | ICD-10-CM | POA: Diagnosis not present

## 2022-05-16 DIAGNOSIS — M4316 Spondylolisthesis, lumbar region: Secondary | ICD-10-CM | POA: Diagnosis not present

## 2022-05-16 DIAGNOSIS — I10 Essential (primary) hypertension: Secondary | ICD-10-CM | POA: Diagnosis not present

## 2022-05-16 DIAGNOSIS — Z794 Long term (current) use of insulin: Secondary | ICD-10-CM | POA: Diagnosis not present

## 2022-05-16 DIAGNOSIS — I700 Atherosclerosis of aorta: Secondary | ICD-10-CM | POA: Diagnosis not present

## 2022-05-16 DIAGNOSIS — E1122 Type 2 diabetes mellitus with diabetic chronic kidney disease: Secondary | ICD-10-CM | POA: Diagnosis not present

## 2022-05-16 DIAGNOSIS — I890 Lymphedema, not elsewhere classified: Secondary | ICD-10-CM | POA: Diagnosis not present

## 2022-05-16 DIAGNOSIS — Z7982 Long term (current) use of aspirin: Secondary | ICD-10-CM | POA: Diagnosis not present

## 2022-05-16 DIAGNOSIS — E782 Mixed hyperlipidemia: Secondary | ICD-10-CM | POA: Diagnosis not present

## 2022-05-17 ENCOUNTER — Telehealth: Payer: Self-pay | Admitting: *Deleted

## 2022-05-17 NOTE — Patient Outreach (Signed)
  Care Coordination   05/17/2022 Name: Vickie Kemp MRN: [id number] DOB: [birth year]   Care Coordination Outreach Attempts:  An unsuccessful telephone outreach was attempted today to offer the patient information about available care coordination services as a benefit of their health plan.   Follow Up Plan:  Additional outreach attempts will be made to offer the patient care coordination information and services.   Encounter Outcome:  No Answer  Care Coordination Interventions Activated:  Yes   Care Coordination Interventions:  No, not indicated    [facility] [phone]

## 2022-05-17 NOTE — Patient Outreach (Signed)
  Care Coordination   Initial Visit Note   05/17/2022 Name: Gilberto Streck MRN: [id number] DOB: 14-Aug-1941  Nakina Spatz is a 81 y.o. year old female who sees McLean-Scocuzza, Nino Glow, MD for primary care. I spoke with  Natina Thum by phone today.  What matters to the patients health and wellness today?  I have a medication that has sky rocketed. I need to talk to you about it. I will call you back when I get home.    Follow up plan:  Patient will call back when she gets home   Encounter Outcome:  Pt. Request to Call [facility] [phone]

## 2022-05-18 DIAGNOSIS — I890 Lymphedema, not elsewhere classified: Secondary | ICD-10-CM | POA: Diagnosis not present

## 2022-05-18 DIAGNOSIS — N1832 Chronic kidney disease, stage 3b: Secondary | ICD-10-CM | POA: Diagnosis not present

## 2022-05-18 DIAGNOSIS — E1122 Type 2 diabetes mellitus with diabetic chronic kidney disease: Secondary | ICD-10-CM | POA: Diagnosis not present

## 2022-05-18 DIAGNOSIS — Z7982 Long term (current) use of aspirin: Secondary | ICD-10-CM | POA: Diagnosis not present

## 2022-05-18 DIAGNOSIS — Z794 Long term (current) use of insulin: Secondary | ICD-10-CM | POA: Diagnosis not present

## 2022-05-18 DIAGNOSIS — I700 Atherosclerosis of aorta: Secondary | ICD-10-CM | POA: Diagnosis not present

## 2022-05-18 DIAGNOSIS — I10 Essential (primary) hypertension: Secondary | ICD-10-CM | POA: Diagnosis not present

## 2022-05-18 DIAGNOSIS — M4316 Spondylolisthesis, lumbar region: Secondary | ICD-10-CM | POA: Diagnosis not present

## 2022-05-18 DIAGNOSIS — E782 Mixed hyperlipidemia: Secondary | ICD-10-CM | POA: Diagnosis not present

## 2022-05-25 ENCOUNTER — Other Ambulatory Visit: Payer: Self-pay

## 2022-05-26 ENCOUNTER — Other Ambulatory Visit: Payer: Self-pay

## 2022-05-26 MED ORDER — NOVOFINE PEN NEEDLE 32G X 6 MM MISC
3 refills | Status: DC
  Filled 2022-07-11: qty 100, 100d supply, fill #0

## 2022-05-26 MED ORDER — GLYXAMBI 10-5 MG PO TABS
1.0000 | ORAL_TABLET | Freq: Every day | ORAL | 3 refills | Status: DC
  Filled 2022-09-14: qty 90, 90d supply, fill #0

## 2022-05-26 MED ORDER — INSULIN DEGLUDEC 200 UNIT/ML ~~LOC~~ SOPN
PEN_INJECTOR | SUBCUTANEOUS | 3 refills | Status: DC
  Filled 2022-07-11: qty 18, 66d supply, fill #0
  Filled 2022-09-14 – 2022-10-04 (×2): qty 18, 66d supply, fill #1

## 2022-05-29 ENCOUNTER — Other Ambulatory Visit: Payer: Self-pay

## 2022-06-12 ENCOUNTER — Encounter (INDEPENDENT_AMBULATORY_CARE_PROVIDER_SITE_OTHER): Payer: Self-pay

## 2022-06-19 ENCOUNTER — Ambulatory Visit (INDEPENDENT_AMBULATORY_CARE_PROVIDER_SITE_OTHER): Payer: Medicare Other | Admitting: Vascular Surgery

## 2022-06-25 NOTE — Progress Notes (Signed)
MRN : [id number]  Vickie Kemp is a 81 y.o. (10-24-1940) female who presents with chief complaint of legs swell.  History of Present Illness:   The patient returns to the office for followup evaluation regarding leg swelling.  The swelling has persisted and the pain associated with swelling continues. There have not been any interval development of a ulcerations or wounds.  Since the previous visit the patient has been wearing graduated compression stockings and has noted little if any improvement in the lymphedema. The patient has been using compression routinely morning until night.  The patient also states elevation during the day and exercise is being done too.  No outpatient medications have been marked as taking for the 06/26/22 encounter (Appointment) with Delana Meyer, Dolores Lory, MD.    Past Medical History:  Diagnosis Date   Chronic kidney disease    Coronary artery disease    MI, pci to mid LAD 2014. LHC 80%OM, 60%RCA, 90% LAD   Diabetes mellitus without complication (HCC)    Hypertension    Sleep apnea     Past Surgical History:  Procedure Laterality Date   ACNE CYST REMOVAL  2008   Groin area   CARDIAC CATHETERIZATION     COLONOSCOPY WITH PROPOFOL N/A 05/27/2020   Procedure: COLONOSCOPY WITH PROPOFOL;  Surgeon: [name], MD;  Location: [facility] ENDOSCOPY;  Service: Gastroenterology;  Laterality: N/A;   CORONARY ANGIOPLASTY      Social History Social History   Tobacco Use   Smoking status: Never   Smokeless tobacco: Never  Vaping Use   Vaping Use: Never used  Substance Use Topics   Alcohol use: Never   Drug use: Never    Family History Family History  Problem Relation Age of Onset   Diabetes Mother    Heart disease Father    Diabetes Sister    Diabetes Brother    Diabetes Brother    Breast cancer Neg Hx     Allergies  Allergen Reactions   Amoxicillin Itching     REVIEW OF SYSTEMS (Negative unless checked)  Constitutional:  '[]'$ Weight loss  '[]'$ Fever  '[]'$ Chills Cardiac: '[]'$ Chest pain   '[]'$ Chest pressure   '[]'$ Palpitations   '[]'$ Shortness of breath when laying flat   '[]'$ Shortness of breath with exertion. Vascular:  '[]'$ Pain in legs with walking   '[x]'$ Pain in legs with standing  '[]'$ History of DVT   '[]'$ Phlebitis   '[x]'$ Swelling in legs   '[]'$ Varicose veins   '[]'$ Non-healing ulcers Pulmonary:   '[]'$ Uses home oxygen   '[]'$ Productive cough   '[]'$ Hemoptysis   '[]'$ Wheeze  '[]'$ COPD   '[]'$ Asthma Neurologic:  '[]'$ Dizziness   '[]'$ Seizures   '[]'$ History of stroke   '[]'$ History of TIA  '[]'$ Aphasia   '[]'$ Vissual changes   '[]'$ Weakness or numbness in arm   '[]'$ Weakness or numbness in leg Musculoskeletal:   '[]'$ Joint swelling   '[]'$ Joint pain   '[]'$ Low back pain Hematologic:  '[]'$ Easy bruising  '[]'$ Easy bleeding   '[]'$ Hypercoagulable state   '[]'$ Anemic Gastrointestinal:  '[]'$ Diarrhea   '[]'$ Vomiting  '[]'$ Gastroesophageal reflux/heartburn   '[]'$ Difficulty swallowing. Genitourinary:  '[]'$ Chronic kidney disease   '[]'$ Difficult urination  '[]'$ Frequent urination   '[]'$ Blood in urine Skin:  '[]'$ Rashes   '[]'$ Ulcers  Psychological:  '[]'$ History of anxiety   '[]'$  History of major depression.  Physical Examination  There were no vitals filed for this visit. There is no height or weight on file to calculate BMI. Gen: WD/WN, NAD Head: [place]/AT, No temporalis wasting.  Ear/Nose/Throat: Hearing grossly intact, nares w/o erythema or  drainage, pinna without lesions Eyes: PER, EOMI, sclera nonicteric.  Neck: Supple, no gross masses.  No JVD.  Pulmonary:  Good air movement, no audible wheezing, no use of accessory muscles.  Cardiac: RRR, precordium not hyperdynamic. Vascular:  scattered varicosities present bilaterally.  Mild venous stasis changes to the legs bilaterally.  3-4+ soft pitting edema, CEAP [id number]  Vessel Right Left  Radial Palpable Palpable  Gastrointestinal: soft, non-distended. No guarding/no peritoneal signs.  Musculoskeletal: M/S 5/5 throughout.  No deformity.  Neurologic: CN 2-12 intact. Pain and light touch  intact in extremities.  Symmetrical.  Speech is fluent. Motor exam as listed above. Psychiatric: Judgment intact, Mood & affect appropriate for pt's clinical situation. Dermatologic: Venous rashes no ulcers noted.  No changes consistent with cellulitis. Lymph : No lichenification or skin changes of chronic lymphedema.  CBC Lab Results  Component Value Date   WBC 8.8 09/27/2021   HGB 12.1 09/27/2021   HCT 37.6 09/27/2021   MCV 88.6 09/27/2021   PLT 204.0 09/27/2021    BMET    Component Value Date/Time   NA 144 09/27/2021 0941   K 4.1 09/27/2021 0941   CL 103 09/27/2021 0941   CO2 32 09/27/2021 0941   GLUCOSE 86 09/27/2021 0941   BUN 19 09/27/2021 0941   CREATININE 1.37 (H) 09/27/2021 0941   CALCIUM 9.6 09/27/2021 0941   GFRNONAA 31 (L) 06/02/2020 1103   GFRAA 37 (L) 04/24/2020 1119   CrCl cannot be calculated (Patient's most recent lab result is older than the maximum 21 days allowed.).  COAG No results found for: "INR", "PROTIME"  Radiology No results found.   Assessment/Plan 1. Lymphedema Recommend:  No surgery or intervention at this point in time.    I have reviewed my discussion with the patient regarding lymphedema and why it  causes symptoms.  Patient will continue wearing graduated compression on a daily basis. The patient should put the compression on first thing in the morning and removing them in the evening. The patient should not sleep in the compression.   In addition, behavioral modification throughout the day will be continued.  This will include frequent elevation (such as in a recliner), use of over the counter pain medications as needed and exercise such as walking.  The systemic causes for chronic edema such as liver, kidney and cardiac etiologies does not appear to have significant changed over the past year.    The patient will continue aggressive use of the  lymph pump.  This will continue to improve the edema control and prevent sequela such as  ulcers and infections.   The patient will follow-up with me on an annual basis.   2. Chronic venous insufficiency Recommend:  No surgery or intervention at this point in time.    I have reviewed my discussion with the patient regarding lymphedema and why it  causes symptoms.  Patient will continue wearing graduated compression on a daily basis. The patient should put the compression on first thing in the morning and removing them in the evening. The patient should not sleep in the compression.   In addition, behavioral modification throughout the day will be continued.  This will include frequent elevation (such as in a recliner), use of over the counter pain medications as needed and exercise such as walking.  The systemic causes for chronic edema such as liver, kidney and cardiac etiologies does not appear to have significant changed over the past year.    The patient will continue aggressive  use of the  lymph pump.  This will continue to improve the edema control and prevent sequela such as ulcers and infections.   The patient will follow-up with me on an annual basis.   3. Essential hypertension Continue antihypertensive medications as already ordered, these medications have been reviewed and there are no changes at this time.  4. Coronary artery disease involving native heart with angina pectoris, unspecified vessel or lesion type ([place]) Continue cardiac and antihypertensive medications as already ordered and reviewed, no changes at this time.  Continue statin as ordered and reviewed, no changes at this time  Nitrates PRN for chest pain  5. Type 2 diabetes mellitus with vascular disease ([place]) Continue hypoglycemic medications as already ordered, these medications have been reviewed and there are no changes at this time.  Hgb A1C to be monitored as already arranged by primary service    Hortencia Pilar, MD  06/25/2022 4:39 PM

## 2022-06-26 ENCOUNTER — Encounter (INDEPENDENT_AMBULATORY_CARE_PROVIDER_SITE_OTHER): Payer: Self-pay | Admitting: Vascular Surgery

## 2022-06-26 ENCOUNTER — Ambulatory Visit (INDEPENDENT_AMBULATORY_CARE_PROVIDER_SITE_OTHER): Payer: Medicare Other | Admitting: Vascular Surgery

## 2022-06-26 VITALS — BP 159/83 | HR 63 | Resp 16

## 2022-06-26 DIAGNOSIS — I872 Venous insufficiency (chronic) (peripheral): Secondary | ICD-10-CM | POA: Diagnosis not present

## 2022-06-26 DIAGNOSIS — I890 Lymphedema, not elsewhere classified: Secondary | ICD-10-CM | POA: Diagnosis not present

## 2022-06-26 DIAGNOSIS — I10 Essential (primary) hypertension: Secondary | ICD-10-CM

## 2022-06-26 DIAGNOSIS — I25119 Atherosclerotic heart disease of native coronary artery with unspecified angina pectoris: Secondary | ICD-10-CM

## 2022-06-26 DIAGNOSIS — E1159 Type 2 diabetes mellitus with other circulatory complications: Secondary | ICD-10-CM | POA: Diagnosis not present

## 2022-06-29 ENCOUNTER — Ambulatory Visit (INDEPENDENT_AMBULATORY_CARE_PROVIDER_SITE_OTHER): Payer: Medicare Other | Admitting: Nurse Practitioner

## 2022-07-02 ENCOUNTER — Encounter (INDEPENDENT_AMBULATORY_CARE_PROVIDER_SITE_OTHER): Payer: Self-pay | Admitting: Vascular Surgery

## 2022-07-03 ENCOUNTER — Other Ambulatory Visit: Payer: Self-pay

## 2022-07-03 DIAGNOSIS — E1159 Type 2 diabetes mellitus with other circulatory complications: Secondary | ICD-10-CM

## 2022-07-03 MED ORDER — FREESTYLE LIBRE 2 SENSOR MISC: 1.0000 | 3 refills | Status: DC

## 2022-07-11 ENCOUNTER — Ambulatory Visit
Admission: EM | Admit: 2022-07-11 | Discharge: 2022-07-11 | Disposition: A | Discharge status: Home / Self Care | Payer: Medicare Other | Attending: Emergency Medicine | Admitting: Emergency Medicine

## 2022-07-11 ENCOUNTER — Other Ambulatory Visit: Payer: Self-pay

## 2022-07-11 DIAGNOSIS — Z1152 Encounter for screening for COVID-19: Secondary | ICD-10-CM | POA: Diagnosis not present

## 2022-07-11 DIAGNOSIS — I10 Essential (primary) hypertension: Secondary | ICD-10-CM | POA: Insufficient documentation

## 2022-07-11 DIAGNOSIS — R051 Acute cough: Secondary | ICD-10-CM | POA: Insufficient documentation

## 2022-07-11 DIAGNOSIS — J069 Acute upper respiratory infection, unspecified: Secondary | ICD-10-CM | POA: Diagnosis not present

## 2022-07-11 LAB — RESP PANEL BY RT-PCR (RSV, FLU A&B, COVID)  RVPGX2
Influenza A by PCR: NEGATIVE
Influenza B by PCR: NEGATIVE
Resp Syncytial Virus by PCR: NEGATIVE
SARS Coronavirus 2 by RT PCR: NEGATIVE

## 2022-07-11 NOTE — ED Triage Notes (Signed)
C/O cough and sneezing. Pt states she had a sore throat but that revolved.

## 2022-07-11 NOTE — Discharge Instructions (Addendum)
Your COVID, Flu, and RSV tests are pending.  Take Tylenol as needed for fever or discomfort.  Rest and keep yourself hydrated.   Follow-up with your primary care provider if your symptoms are not improving.  Go to the emergency department if you have worsening symptoms.   Your blood pressure is elevated today at 188/79; repeat 179/79.  Please have this rechecked by your primary care provider tomorrow.

## 2022-07-11 NOTE — ED Provider Notes (Signed)
Roderic Palau    CSN: [id number] Arrival date & time: 07/11/22  1403      History   Chief Complaint Chief Complaint  Patient presents with   Cough    HPI Vickie Kemp is a 81 y.o. female.  Patient presents with 2-day history of sore throat, runny nose, sneezing, mild cough.  She denies fever, rash, chest pain, shortness of breath, vomiting, diarrhea, or other symptoms.  No OTC medications taken today.  Her medical history includes hypertension, diabetes, CKD stage IV.  The history is provided by the patient and medical records.    Past Medical History:  Diagnosis Date   Chronic kidney disease    Coronary artery disease    MI, pci to mid LAD 2014. LHC 80%OM, 60%RCA, 90% LAD   Diabetes mellitus without complication ([place])    Hypertension    Sleep apnea     Patient Active Problem List   Diagnosis Date Noted   Aortic atherosclerosis ([place]) 03/10/2022   Chronic midline low back pain 03/03/2022   Abnormal MRI, lumbar spine 03/03/2022   Anemia in chronic kidney disease 06/22/2021   Shortness of breath 09/14/2020   Chronic venous insufficiency 08/02/2020   Chronic diarrhea of unknown origin    Lymphedema 05/18/2020   Benign hypertensive kidney disease with chronic kidney disease 05/03/2020   Chronic kidney disease, stage IV (severe) ([place]) 05/03/2020   Diabetes mellitus ([place]) 05/03/2020   OSA on CPAP 02/29/2020   Hypertension associated with diabetes ([place]) 01/20/2020   Memory loss 01/20/2020   Tremor 01/20/2020   Essential hypertension 01/20/2020   Left lumbar radiculopathy 12/26/2019   At risk for obstructive sleep apnea 11/21/2019   Allergic rhinitis 11/21/2019   Cystitis 10/17/2019   PVD (peripheral vascular disease) ([place]) 10/02/2019   Stage 3b chronic kidney disease ([place]) 09/30/2019   Lumbar radiculopathy 09/30/2019   Cervicalgia 09/30/2019   CKD (chronic kidney disease) stage 4, GFR 15-29 ml/min (HCC) 09/30/2019   Arthritis 09/30/2019   Chronic  maxillary sinusitis 09/30/2019   Venous stasis 08/28/2019   Secondary hyperparathyroidism of renal origin ([place]) 07/29/2019   Type 2 diabetes mellitus with diabetic chronic kidney disease ([place]) 06/10/2019   Pain due to onychomycosis of toenails of both feet 05/01/2019   Type 2 diabetes mellitus with vascular disease ([place]) 05/01/2019   Chronic arthropathy 05/01/2019   Obesity 01/21/2018   CAD (coronary artery disease) 06/04/2017   Hyperlipemia 06/04/2017   Insulin dependent diabetes mellitus 06/04/2017   S/P PTCA (percutaneous transluminal coronary angioplasty) 06/04/2017   Palpitations 06/04/2017   Essential (primary) hypertension 06/04/2015    Past Surgical History:  Procedure Laterality Date   ACNE CYST REMOVAL  2008   Groin area   CARDIAC CATHETERIZATION     COLONOSCOPY WITH PROPOFOL N/A 05/27/2020   Procedure: COLONOSCOPY WITH PROPOFOL;  Surgeon: [name], MD;  Location: [facility] ENDOSCOPY;  Service: Gastroenterology;  Laterality: N/A;   CORONARY ANGIOPLASTY      OB History   No obstetric history on file.      Home Medications    Prior to Admission medications   Medication Sig Start Date End Date Taking? Authorizing Provider  acetaminophen (TYLENOL) 650 MG CR tablet Take 650 mg by mouth 3 (three) times daily as needed.   Yes [provider]  amLODipine (NORVASC) 5 MG tablet Take 1 tablet (5 mg total) by mouth daily. 04/20/22  Yes Kate Sable, MD  aspirin EC 81 MG tablet Take 81 mg by mouth daily.  Yes [provider]  carvedilol (COREG) 25 MG tablet TAKE 1 TABLET(25 MG) BY MOUTH TWICE DAILY 02/16/22  Yes McLean-Scocuzza, Nino Glow, MD  Cholecalciferol (VITAMIN D3 PO) Take 1 capsule by mouth daily.   Yes [provider]  Empagliflozin-linaGLIPtin (GLYXAMBI) 10-5 MG TABS Take 1 tablet by mouth daily.   Yes [provider]  furosemide (LASIX) 40 MG tablet Take 1 tablet (40 mg total) by mouth in the morning and at bedtime. Patient  taking differently: Take 40 mg by mouth daily. 01/24/21  Yes Agbor-Etang, Aaron Edelman, MD  insulin degludec (TRESIBA FLEXTOUCH) 200 UNIT/ML FlexTouch Pen Inject 32 Units into the skin daily. D/c 56 units 04/10/22  Yes McLean-Scocuzza, Nino Glow, MD  Turmeric (QC TUMERIC COMPLEX PO) Take 1 capsule by mouth daily.   Yes [provider]  atorvastatin (LIPITOR) 40 MG tablet Take 1 tablet (40 mg total) by mouth daily. Qhs 08/22/21   Kate Sable, MD  Continuous Blood Gluc Sensor (FREESTYLE LIBRE 2 SENSOR) MISC Apply 1 each topically every 14 (fourteen) days. 07/03/22   Kennyth Arnold, FNP  Empagliflozin-linaGLIPtin (GLYXAMBI) 10-5 MG TABS Take one tablet by mouth daily 05/16/22     insulin degludec (TRESIBA) 200 UNIT/ML FlexTouch Pen Inject 32 units into the skin daily 05/16/22     insulin lispro (HUMALOG KWIKPEN) 100 UNIT/ML KwikPen Inject 8 Units into the skin 3 (three) times daily. Patient taking differently: Inject 8 Units into the skin daily as needed (glucose greater than 300). 07/29/21   McLean-Scocuzza, Nino Glow, MD  Insulin Pen Needle (NOVOFINE PEN NEEDLE) 32G X 6 MM MISC Use one pen needle with Tresiba injection 05/16/22     Insulin Pen Needle 32G X 4 MM MISC 1 Device by Does not apply route 4 (four) times daily - after meals and at bedtime. 05/01/22   McLean-Scocuzza, Nino Glow, MD  latanoprost (XALATAN) 0.005 % ophthalmic solution Place 1 drop into the left eye at bedtime.    [provider]  losartan (COZAAR) 100 MG tablet Take 100 mg by mouth daily.    [provider]  [facility] ULTRA test strip USE TO CHECK BLOOD SUGAR THREE TIMES DAILY 05/31/21   McLean-Scocuzza, Nino Glow, MD    Family History Family History  Problem Relation Age of Onset   Diabetes Mother    Heart disease Father    Diabetes Sister    Diabetes Brother    Diabetes Brother    Breast cancer Neg Hx     Social History Social History   Tobacco Use   Smoking status: Never   Smokeless tobacco: Never   Vaping Use   Vaping Use: Never used  Substance Use Topics   Alcohol use: Never   Drug use: Never     Allergies   Amoxicillin   Review of Systems Review of Systems  Constitutional:  Negative for chills and fever.  HENT:  Positive for congestion, rhinorrhea and sore throat. Negative for ear pain.   Respiratory:  Positive for cough. Negative for shortness of breath.   Cardiovascular:  Negative for chest pain and palpitations.  Gastrointestinal:  Negative for diarrhea and vomiting.  Skin:  Negative for color change and rash.  All other systems reviewed and are negative.    Physical Exam Triage Vital Signs ED Triage Vitals  Enc Vitals Group     BP      Pulse      Resp      Temp      Temp src  SpO2      Weight      Height      Head Circumference      Peak Flow      Pain Score      Pain Loc      Pain Edu?      Excl. in [place]?    No data found.  Updated Vital Signs BP (!) 179/79   Pulse 65   Temp 98.4 F (36.9 C)   Resp 18   LMP  (LMP Unknown)   SpO2 93%   Visual Acuity Right Eye Distance:   Left Eye Distance:   Bilateral Distance:    Right Eye Near:   Left Eye Near:    Bilateral Near:     Physical Exam Vitals and nursing note reviewed.  Constitutional:      General: She is not in acute distress.    Appearance: Normal appearance. She is well-developed. She is not ill-appearing.  HENT:     Right Ear: Tympanic membrane normal.     Left Ear: Tympanic membrane normal.     Nose: Nose normal.     Mouth/Throat:     Mouth: Mucous membranes are moist.     Pharynx: Oropharynx is clear.  Eyes:     Conjunctiva/sclera: Conjunctivae normal.  Cardiovascular:     Rate and Rhythm: Normal rate and regular rhythm.     Heart sounds: Normal heart sounds.  Pulmonary:     Effort: Pulmonary effort is normal. No respiratory distress.     Breath sounds: Normal breath sounds.  Musculoskeletal:     Cervical back: Neck supple.  Skin:    General: Skin is warm and  dry.  Neurological:     Mental Status: She is alert.  Psychiatric:        Mood and Affect: Mood normal.        Behavior: Behavior normal.      UC Treatments / Results  Labs (all labs ordered are listed, but only abnormal results are displayed) Labs Reviewed  RESP PANEL BY RT-PCR (RSV, FLU A&B, COVID)  [id number]    EKG   Radiology No results found.  Procedures Procedures (including critical care time)  Medications Ordered in UC Medications - No data to display  Initial Impression / Assessment and Plan / UC Course  I have reviewed the triage vital signs and the nursing notes.  Pertinent labs & imaging results that were available during my care of the patient were reviewed by me and considered in my medical decision making (see chart for details).    Cough, Viral URI. Elevated blood pressure with HTN.  Blood pressure elevated today, 188/79 and repeated at 179/79.  Patient declines transfer to the ED.  She does not have any chest pain, shortness of breath, focal weakness, numbness.  She agrees to follow-up with her PCP tomorrow.  She states she has been checking her blood pressure at home and it has been normal.  COVID, Flu, RSV pending.  Discussed symptomatic treatment including Tylenol, rest, hydration.  Instructed patient to follow up with PCP if symptoms are not improving.  ED precautions discussed.  Education provided on viral respiratory infection and managing hypertension.  She agrees to plan of care.   Final Clinical Impressions(s) / UC Diagnoses   Final diagnoses:  Acute cough  Viral URI  Elevated blood pressure reading in office with diagnosis of hypertension     Discharge Instructions      Your COVID, Flu, and RSV  tests are pending.  Take Tylenol as needed for fever or discomfort.  Rest and keep yourself hydrated.   Follow-up with your primary care provider if your symptoms are not improving.  Go to the emergency department if you have worsening symptoms.    Your blood pressure is elevated today at 188/79; repeat 179/79.  Please have this rechecked by your primary care provider tomorrow.          ED Prescriptions   None    PDMP not reviewed this encounter.   Sharion Balloon, NP 07/11/22 [phone]

## 2022-07-12 ENCOUNTER — Telehealth: Payer: Medicare Other | Admitting: Family Medicine

## 2022-07-12 ENCOUNTER — Other Ambulatory Visit: Payer: Self-pay

## 2022-07-13 ENCOUNTER — Ambulatory Visit: Payer: Medicare Other | Admitting: Podiatry

## 2022-07-13 ENCOUNTER — Other Ambulatory Visit: Payer: Self-pay

## 2022-07-13 DIAGNOSIS — E1165 Type 2 diabetes mellitus with hyperglycemia: Secondary | ICD-10-CM | POA: Diagnosis not present

## 2022-07-13 DIAGNOSIS — E049 Nontoxic goiter, unspecified: Secondary | ICD-10-CM | POA: Diagnosis not present

## 2022-07-13 DIAGNOSIS — I10 Essential (primary) hypertension: Secondary | ICD-10-CM | POA: Diagnosis not present

## 2022-07-13 DIAGNOSIS — E785 Hyperlipidemia, unspecified: Secondary | ICD-10-CM | POA: Diagnosis not present

## 2022-07-13 DIAGNOSIS — R601 Generalized edema: Secondary | ICD-10-CM | POA: Diagnosis not present

## 2022-07-27 ENCOUNTER — Ambulatory Visit (INDEPENDENT_AMBULATORY_CARE_PROVIDER_SITE_OTHER): Payer: Medicare Other | Admitting: Vascular Surgery

## 2022-07-27 DIAGNOSIS — E049 Nontoxic goiter, unspecified: Secondary | ICD-10-CM | POA: Diagnosis not present

## 2022-07-27 DIAGNOSIS — I10 Essential (primary) hypertension: Secondary | ICD-10-CM | POA: Diagnosis not present

## 2022-07-27 DIAGNOSIS — G4733 Obstructive sleep apnea (adult) (pediatric): Secondary | ICD-10-CM | POA: Diagnosis not present

## 2022-07-27 DIAGNOSIS — E1165 Type 2 diabetes mellitus with hyperglycemia: Secondary | ICD-10-CM | POA: Diagnosis not present

## 2022-08-15 DIAGNOSIS — E119 Type 2 diabetes mellitus without complications: Secondary | ICD-10-CM | POA: Diagnosis not present

## 2022-08-15 DIAGNOSIS — H524 Presbyopia: Secondary | ICD-10-CM | POA: Diagnosis not present

## 2022-08-21 DIAGNOSIS — I10 Essential (primary) hypertension: Secondary | ICD-10-CM | POA: Diagnosis not present

## 2022-08-21 DIAGNOSIS — N2581 Secondary hyperparathyroidism of renal origin: Secondary | ICD-10-CM | POA: Diagnosis not present

## 2022-08-21 DIAGNOSIS — N1832 Chronic kidney disease, stage 3b: Secondary | ICD-10-CM | POA: Diagnosis not present

## 2022-08-21 DIAGNOSIS — E1122 Type 2 diabetes mellitus with diabetic chronic kidney disease: Secondary | ICD-10-CM | POA: Diagnosis not present

## 2022-08-24 ENCOUNTER — Ambulatory Visit (INDEPENDENT_AMBULATORY_CARE_PROVIDER_SITE_OTHER): Payer: Medicare Other | Admitting: Vascular Surgery

## 2022-08-26 NOTE — Progress Notes (Signed)
MRN : [id number]  Vickie Kemp is a 82 y.o. (1940/12/29) female who presents with chief complaint of legs swell.  History of Present Illness:   The patient returns to the office for followup evaluation regarding leg swelling.  The swelling has persisted and the pain associated with swelling continues. There have not been any interval development of a ulcerations or wounds.   Since the previous visit the patient has been wearing graduated compression stockings intermittently and has noted little if any improvement in the lymphedema. The patient has been using compression when she tolerates them but her swelling has been so bad she is having trouble getting them on.   The patient also states elevation during the day and exercise is being done too.  She is using her lymphedema pump which helps but the improvement is not lasting.  She was seeing Dr Milon Dikes but she is no longer with [facility] and she has been given an appointment in min March  No outpatient medications have been marked as taking for the 08/28/22 encounter (Appointment) with Delana Meyer, Dolores Lory, MD.    Past Medical History:  Diagnosis Date   Chronic kidney disease    Coronary artery disease    MI, pci to mid LAD 2014. LHC 80%OM, 60%RCA, 90% LAD   Diabetes mellitus without complication (HCC)    Hypertension    Sleep apnea     Past Surgical History:  Procedure Laterality Date   ACNE CYST REMOVAL  2008   Groin area   CARDIAC CATHETERIZATION     COLONOSCOPY WITH PROPOFOL N/A 05/27/2020   Procedure: COLONOSCOPY WITH PROPOFOL;  Surgeon: [name], MD;  Location: [facility] ENDOSCOPY;  Service: Gastroenterology;  Laterality: N/A;   CORONARY ANGIOPLASTY      Social History Social History   Tobacco Use   Smoking status: Never   Smokeless tobacco: Never  Vaping Use   Vaping Use: Never used  Substance Use Topics   Alcohol use: Never   Drug use: Never    Family History Family  History  Problem Relation Age of Onset   Diabetes Mother    Heart disease Father    Diabetes Sister    Diabetes Brother    Diabetes Brother    Breast cancer Neg Hx     Allergies  Allergen Reactions   Amoxicillin Itching     REVIEW OF SYSTEMS (Negative unless checked)  Constitutional: '[]'$ Weight loss  '[]'$ Fever  '[]'$ Chills Cardiac: '[]'$ Chest pain   '[]'$ Chest pressure   '[]'$ Palpitations   '[]'$ Shortness of breath when laying flat   '[]'$ Shortness of breath with exertion. Vascular:  '[]'$ Pain in legs with walking   '[x]'$ Pain in legs with standing  '[]'$ History of DVT   '[]'$ Phlebitis   '[x]'$ Swelling in legs   '[]'$ Varicose veins   '[]'$ Non-healing ulcers Pulmonary:   '[]'$ Uses home oxygen   '[]'$ Productive cough   '[]'$ Hemoptysis   '[]'$ Wheeze  '[]'$ COPD   '[]'$ Asthma Neurologic:  '[]'$ Dizziness   '[]'$ Seizures   '[]'$ History of stroke   '[]'$ History of TIA  '[]'$ Aphasia   '[]'$ Vissual changes   '[]'$ Weakness or numbness in arm   '[]'$ Weakness or numbness in leg Musculoskeletal:   '[]'$ Joint swelling   '[]'$ Joint pain   '[]'$ Low back pain Hematologic:  '[]'$ Easy bruising  '[]'$ Easy bleeding   '[]'$ Hypercoagulable state   '[]'$ Anemic Gastrointestinal:  '[]'$ Diarrhea   '[]'$ Vomiting  '[]'$ Gastroesophageal reflux/heartburn   '[]'$ Difficulty swallowing. Genitourinary:  '[]'$ Chronic kidney disease   '[]'$ Difficult urination  '[]'$ Frequent urination   '[]'$ Blood in urine Skin:  '[]'$   Rashes   '[]'$ Ulcers  Psychological:  '[]'$ History of anxiety   '[]'$  History of major depression.  Physical Examination  There were no vitals filed for this visit. There is no height or weight on file to calculate BMI. Gen: WD/WN, NAD Head: [place]/AT, No temporalis wasting.  Ear/Nose/Throat: Hearing grossly intact, nares w/o erythema or drainage, pinna without lesions Eyes: PER, EOMI, sclera nonicteric.  Neck: Supple, no gross masses.  No JVD.  Pulmonary:  Good air movement, no audible wheezing, no use of accessory muscles.  Cardiac: RRR, precordium not hyperdynamic. Vascular:  scattered varicosities present bilaterally.  Mild venous stasis  changes to the legs bilaterally.  3-4+ soft pitting edema poorly controlled, CEAP [id number]  Vessel Right Left  Radial Palpable Palpable  Gastrointestinal: soft, non-distended. No guarding/no peritoneal signs.  Musculoskeletal: M/S 5/5 throughout.  No deformity.  Neurologic: CN 2-12 intact. Pain and light touch intact in extremities.  Symmetrical.  Speech is fluent. Motor exam as listed above. Psychiatric: Judgment intact, Mood & affect appropriate for pt's clinical situation. Dermatologic: Venous rashes no ulcers noted.  No changes consistent with cellulitis. Lymph : No lichenification or skin changes of chronic lymphedema.  CBC Lab Results  Component Value Date   WBC 8.8 09/27/2021   HGB 12.1 09/27/2021   HCT 37.6 09/27/2021   MCV 88.6 09/27/2021   PLT 204.0 09/27/2021    BMET    Component Value Date/Time   NA 144 09/27/2021 0941   K 4.1 09/27/2021 0941   CL 103 09/27/2021 0941   CO2 32 09/27/2021 0941   GLUCOSE 86 09/27/2021 0941   BUN 19 09/27/2021 0941   CREATININE 1.37 (H) 09/27/2021 0941   CALCIUM 9.6 09/27/2021 0941   GFRNONAA 31 (L) 06/02/2020 1103   GFRAA 37 (L) 04/24/2020 1119   CrCl cannot be calculated (Patient's most recent lab result is older than the maximum 21 days allowed.).  COAG No results found for: "INR", "PROTIME"  Radiology No results found.   Assessment/Plan 1. Lymphedema Recommend:  No surgery or intervention at this point in time.    I have reviewed my discussion with the patient regarding lymphedema and why it  causes symptoms.  Patient will need to wear graduated compression on a daily basis. I have talked to her about going to [facility] to be refitted with wraps.  The patient should put the compression on first thing in the morning and removing them in the evening. The patient should not sleep in the compression.   In addition, behavioral modification throughout the day will be continued.  This will include frequent elevation (such as in a  recliner), use of over the counter pain medications as needed and exercise such as walking.  The systemic causes seem to be a major factor and do not seem to be under good control.  She was seeing Dr Milon Dikes but she is no longer with [facility] and she has been given an appointment in min March.    The patient will continue aggressive use of the  lymph pump.  This will continue to improve the edema control and prevent sequela such as ulcers and infections.   The patient will follow-up with me on an annual basis.   2. Chronic venous insufficiency See #1  3. Coronary artery disease involving native heart with angina pectoris, unspecified vessel or lesion type [facility]) Continue cardiac and antihypertensive medications as already ordered and reviewed, no changes at this time.  Continue statin as ordered and reviewed, no changes  at this time  Nitrates PRN for chest pain  4. Essential (primary) hypertension Continue antihypertensive medications as already ordered, these medications have been reviewed and there are no changes at this time.  5. Type 2 diabetes mellitus with vascular disease ([place]) Continue hypoglycemic medications as already ordered, these medications have been reviewed and there are no changes at this time.  Hgb A1C to be monitored as already arranged by primary service    Hortencia Pilar, MD  08/26/2022 2:30 PM

## 2022-08-28 ENCOUNTER — Encounter (INDEPENDENT_AMBULATORY_CARE_PROVIDER_SITE_OTHER): Payer: Self-pay | Admitting: Vascular Surgery

## 2022-08-28 ENCOUNTER — Ambulatory Visit (INDEPENDENT_AMBULATORY_CARE_PROVIDER_SITE_OTHER): Payer: Medicare Other | Admitting: Vascular Surgery

## 2022-08-28 VITALS — BP 169/73 | HR 66 | Resp 16

## 2022-08-28 DIAGNOSIS — I890 Lymphedema, not elsewhere classified: Secondary | ICD-10-CM

## 2022-08-28 DIAGNOSIS — I872 Venous insufficiency (chronic) (peripheral): Secondary | ICD-10-CM | POA: Diagnosis not present

## 2022-08-28 DIAGNOSIS — E1159 Type 2 diabetes mellitus with other circulatory complications: Secondary | ICD-10-CM

## 2022-08-28 DIAGNOSIS — I10 Essential (primary) hypertension: Secondary | ICD-10-CM

## 2022-08-28 DIAGNOSIS — I25119 Atherosclerotic heart disease of native coronary artery with unspecified angina pectoris: Secondary | ICD-10-CM

## 2022-09-03 ENCOUNTER — Encounter (INDEPENDENT_AMBULATORY_CARE_PROVIDER_SITE_OTHER): Payer: Self-pay | Admitting: Vascular Surgery

## 2022-09-14 ENCOUNTER — Other Ambulatory Visit: Payer: Self-pay | Admitting: Cardiology

## 2022-09-14 ENCOUNTER — Ambulatory Visit: Payer: Medicare Other | Admitting: Podiatry

## 2022-09-15 ENCOUNTER — Other Ambulatory Visit (HOSPITAL_COMMUNITY): Payer: Self-pay

## 2022-09-15 ENCOUNTER — Other Ambulatory Visit: Payer: Self-pay

## 2022-09-19 ENCOUNTER — Other Ambulatory Visit: Payer: Self-pay

## 2022-09-19 ENCOUNTER — Other Ambulatory Visit (HOSPITAL_COMMUNITY): Payer: Self-pay

## 2022-09-22 ENCOUNTER — Other Ambulatory Visit: Payer: Self-pay

## 2022-09-23 ENCOUNTER — Other Ambulatory Visit (HOSPITAL_COMMUNITY): Payer: Self-pay

## 2022-09-25 ENCOUNTER — Other Ambulatory Visit: Payer: Self-pay

## 2022-09-26 ENCOUNTER — Ambulatory Visit: Payer: Medicare Other | Admitting: Internal Medicine

## 2022-09-26 ENCOUNTER — Encounter: Payer: Self-pay | Admitting: Internal Medicine

## 2022-09-26 ENCOUNTER — Other Ambulatory Visit: Payer: Self-pay

## 2022-09-26 VITALS — BP 132/80 | HR 69 | Temp 97.8°F | Ht 64.0 in | Wt 194.0 lb

## 2022-09-26 DIAGNOSIS — G4733 Obstructive sleep apnea (adult) (pediatric): Secondary | ICD-10-CM

## 2022-09-26 NOTE — Progress Notes (Signed)
$@Patientj$  ID: Wayne Both, female    DOB: 1941/01/07, 82 y.o.   MRN: [id number]   SYNOPSIS Patient presents today for 6 month follow-up sleep apnea. She has not been tolerating CPAP well. She reports a dry cough since she started wearing CPAP. She continues to use CPAP intermittently, she will stop using it for several nights d/t cough. She also experiences dyspnea on exertion with associated wheezing. She thought her breathing would improve significantly once she started using CPAP machine. She has hx heart failure and pulmonary hypertension. She takes lasix morning and at bedtime. She often wakes up several times at night to use the restroom.    Airview download 08/09/20-09/07/20: 10/30 (33%) days used; 20% > 4 hours Average usage 5 hours 8 mins   Pressure 5-15cm h20 (9.8cm h20-95%) Airleaks 7.7L/min (95%) AHI 2.8    CC Follow up severe OSA    HPI: 82 year old female, never smoked. PMH significant for CAD, HTN, type 2 diabetes, severe OSA, chronic maxillary sinusitis, chronic kidney disease.  Patient has severe sleep apnea with a AHI of 40  Patient REFUSES TO WEAR CPAP AT [place] IS "TOO OLD"   No exacerbation at this time No evidence of heart failure at this time No evidence or signs of infection at this time No respiratory distress No fevers, chills, nausea, vomiting, diarrhea No evidence of lower extremity edema No evidence hemoptysis   Allergies  Allergen Reactions   Amoxicillin Itching    Immunization History  Administered Date(s) Administered   Fluad Quad(high Dose 65+) 04/28/2019, 05/14/2020, 06/07/2021   Influenza, High Dose Seasonal PF 06/04/2017, 06/07/2018   PFIZER Comirnaty(Gray Top)Covid-19 Tri-Sucrose Vaccine 03/18/2021   PFIZER(Purple Top)SARS-COV-2 Vaccination 10/08/2019, 10/29/2019, 05/14/2020   Pfizer Covid-19 Vaccine Bivalent Booster 10yr & up 08/09/2021, 04/07/2022   Pneumococcal Conjugate-13 10/04/2021   Pneumococcal  Polysaccharide-23 05/05/2013    Past Medical History:  Diagnosis Date   Chronic kidney disease    Coronary artery disease    MI, pci to mid LAD 2014. LHC 80%OM, 60%RCA, 90% LAD   Diabetes mellitus without complication (HCC)    Hypertension    Sleep apnea     Tobacco History: Social History   Tobacco Use  Smoking Status Never  Smokeless Tobacco Never   Counseling given: Not Answered   Outpatient Medications Prior to Visit  Medication Sig Dispense Refill   acetaminophen (TYLENOL) 650 MG CR tablet Take 650 mg by mouth 3 (three) times daily as needed.     amLODipine (NORVASC) 5 MG tablet Take 1 tablet (5 mg total) by mouth daily. 90 tablet 3   aspirin EC 81 MG tablet Take 81 mg by mouth daily.     atorvastatin (LIPITOR) 40 MG tablet Take 1 tablet (40 mg total) by mouth daily. Qhs 90 tablet 0   carvedilol (COREG) 25 MG tablet TAKE 1 TABLET(25 MG) BY MOUTH TWICE DAILY 180 tablet 1   Cholecalciferol (VITAMIN D3 PO) Take 1 capsule by mouth daily.     Continuous Blood Gluc Sensor (FREESTYLE LIBRE 2 SENSOR) MISC Apply 1 each topically every 14 (fourteen) days. 6 each 3   Empagliflozin-linaGLIPtin (GLYXAMBI) 10-5 MG TABS Take 1 tablet by mouth daily.     Empagliflozin-linaGLIPtin (GLYXAMBI) 10-5 MG TABS Take 1 tablet by mouth daily. 90 tablet 3   furosemide (LASIX) 40 MG tablet Take 1 tablet (40 mg total) by mouth in the morning and at bedtime. (Patient taking differently: Take 40 mg by mouth daily.)  60 tablet 6   insulin degludec (TRESIBA FLEXTOUCH) 200 UNIT/ML FlexTouch Pen Inject 32 Units into the skin daily. D/c 56 units 9 mL 11   insulin degludec (TRESIBA) 200 UNIT/ML FlexTouch Pen Inject 32 units into the skin daily 27 mL 3   insulin lispro (HUMALOG KWIKPEN) 100 UNIT/ML KwikPen Inject 8 Units into the skin 3 (three) times daily. (Patient taking differently: Inject 8 Units into the skin daily as needed (glucose greater than 300).) 15 mL 1   Insulin Pen Needle (NOVOFINE PEN NEEDLE) 32G  X 6 MM MISC Use one pen needle with Tresiba injection 200 each 3   Insulin Pen Needle 32G X 4 MM MISC 1 Device by Does not apply route 4 (four) times daily - after meals and at bedtime. 360 each 3   latanoprost (XALATAN) 0.005 % ophthalmic solution Place 1 drop into the left eye at bedtime.     losartan (COZAAR) 100 MG tablet Take 100 mg by mouth daily.     Omega-3 Fatty Acids (FISH OIL) 1000 MG CAPS Take 1,000 mg by mouth daily.     ONETOUCH ULTRA test strip USE TO CHECK BLOOD SUGAR THREE TIMES DAILY 300 strip 5   Turmeric (QC TUMERIC COMPLEX PO) Take 1 capsule by mouth daily.     No facility-administered medications prior to visit.    BP 132/80 (BP Location: Left Arm, Cuff Size: Normal)   Pulse 69   Temp 97.8 F (36.6 C) (Temporal)   Ht 5' 4"$  (1.626 m)   Wt 194 lb (88 kg)   LMP  (LMP Unknown)   SpO2 97%   BMI 33.30 kg/m    Review of Systems: Gen:  Denies  fever, sweats, chills weight loss  HEENT: Denies blurred vision, double vision, ear pain, eye pain, hearing loss, nose bleeds, sore throat Cardiac:  No dizziness, chest pain or heaviness, chest tightness,edema, No JVD Resp:   No cough, -sputum production, -shortness of breath,-wheezing, -hemoptysis,  Other:  All other systems negative    Physical Examination:   General Appearance: No distress  EYES PERRLA, EOM intact.   NECK Supple, No JVD Pulmonary: normal breath sounds, No wheezing.  CardiovascularNormal S1,S2.  No m/r/g.   Abdomen: Benign, Soft, non-tender. ALL OTHER ROS ARE NEGATIVE   ALL OTHER ROS ARE NEGATIVE   CBC    Component Value Date/Time   WBC 8.8 09/27/2021 0941   RBC 4.24 09/27/2021 0941   HGB 12.1 09/27/2021 0941   HCT 37.6 09/27/2021 0941   PLT 204.0 09/27/2021 0941   MCV 88.6 09/27/2021 0941   MCH 28.6 04/24/2020 1119   MCHC 32.2 09/27/2021 0941   RDW 14.6 09/27/2021 0941   LYMPHSABS 2.3 09/27/2021 0941   MONOABS 0.7 09/27/2021 0941   EOSABS 0.2 09/27/2021 0941   BASOSABS 0.1 09/27/2021  0941    BMET    Component Value Date/Time   NA 144 09/27/2021 0941   K 4.1 09/27/2021 0941   CL 103 09/27/2021 0941   CO2 32 09/27/2021 0941   GLUCOSE 86 09/27/2021 0941   BUN 19 09/27/2021 0941   CREATININE 1.37 (H) 09/27/2021 0941   CALCIUM 9.6 09/27/2021 0941   GFRNONAA 31 (L) 06/02/2020 1103   GFRAA 37 (L) 04/24/2020 1119    BNP    Component Value Date/Time   BNP 79.0 04/24/2020 1119     Assessment & Plan:   82 year old pleasant African-American female with a history of cor pulmonale diastolic heart failure with severe sleep apnea based  on sleep study completed last year with a  AHI of 40 , patient has been noncompliant with her auto CPAP therapy, continues to be noncompliant   I have discussed her sleep study results from several years ago She has refused to start therapy  Patient with intermittent shortness of breath and dyspnea on exertion This is multifactorial based on her weight as well as her diagnosis of heart failure and pulm hypertension Findings consistent with cor pulmonale Continue Lasix as prescribed Follow-up with cardiology as prescribed  Patient was prescribed albuterol as needed   The most pressing issue is that she started auto CPAP therapy as soon as possible I have explained to the patient that delaying CPAP therapy for sleep apnea can lead to stroke, acute heart failure, uncontrolled hypertension, uncontrolled diabetes, possible fatal arrhythmias.  Patient understands the risks of not starting CPAP therapy   MEDICATION ADJUSTMENTS/LABS AND TESTS ORDERED:  Follow-up with cardiology as scheduled   Patient  satisfied with Plan of action and management. All questions answered  Follow up As needed  Total Time Spent  20 mins   Maretta Bees Patricia Pesa, M.D.  [facility]  Medical Director [place] Director [facility] Cardio-Pulmonary Department

## 2022-09-26 NOTE — Patient Instructions (Signed)
Follow up with Cardiology and Orthopedic Surgeon

## 2022-09-28 DIAGNOSIS — M4316 Spondylolisthesis, lumbar region: Secondary | ICD-10-CM | POA: Diagnosis not present

## 2022-09-28 DIAGNOSIS — M4807 Spinal stenosis, lumbosacral region: Secondary | ICD-10-CM | POA: Diagnosis not present

## 2022-09-28 DIAGNOSIS — I739 Peripheral vascular disease, unspecified: Secondary | ICD-10-CM | POA: Diagnosis not present

## 2022-10-02 ENCOUNTER — Encounter: Payer: Self-pay | Admitting: Podiatry

## 2022-10-02 ENCOUNTER — Ambulatory Visit: Payer: Medicare Other | Admitting: Podiatry

## 2022-10-02 VITALS — BP 155/67 | HR 68

## 2022-10-02 DIAGNOSIS — M79674 Pain in right toe(s): Secondary | ICD-10-CM | POA: Diagnosis not present

## 2022-10-02 DIAGNOSIS — E1159 Type 2 diabetes mellitus with other circulatory complications: Secondary | ICD-10-CM | POA: Diagnosis not present

## 2022-10-02 DIAGNOSIS — E1142 Type 2 diabetes mellitus with diabetic polyneuropathy: Secondary | ICD-10-CM | POA: Diagnosis not present

## 2022-10-02 DIAGNOSIS — B351 Tinea unguium: Secondary | ICD-10-CM | POA: Diagnosis not present

## 2022-10-02 DIAGNOSIS — M79675 Pain in left toe(s): Secondary | ICD-10-CM

## 2022-10-02 DIAGNOSIS — E119 Type 2 diabetes mellitus without complications: Secondary | ICD-10-CM

## 2022-10-03 NOTE — Progress Notes (Signed)
  Subjective:  Patient ID: Vickie Kemp, female    DOB: [birth year],  MRN: [id number]  Chief Complaint  Patient presents with   Nail Problem    "Clip the toenails and examine the foot."    82 y.o. female presents with the above complaint. History confirmed with patient.  Nails are thickened elongated causing pain and discomfort  Objective:  Physical Exam: warm, good capillary refill, no trophic changes or ulcerative lesions, normal DP and PT pulses, and abnormal sensory exam. Left Foot: dystrophic yellowed discolored nail plates with subungual debris Right Foot: dystrophic yellowed discolored nail plates with subungual debris   Assessment:   1. Pain due to onychomycosis of toenails of both feet   2. Type 2 diabetes mellitus with vascular disease ([place])   3. Diabetic peripheral neuropathy ([place])   4. Encounter for diabetic foot exam [facility])      Plan:  Patient was evaluated and treated and all questions answered.  Patient educated on diabetes. Discussed proper diabetic foot care and discussed risks and complications of disease. Educated patient in depth on reasons to return to the office immediately should he/she discover anything concerning or new on the feet. All questions answered. Discussed proper shoes as well.  We discussed some numbness and tingling she is having an early signs of diabetic peripheral neuropathy.  She would prefer to avoid oral medication at this point we discussed using OTC topical medications such as pain relief creams with lidocaine before bed.  Return to see me as needed if this does not improve  Discussed the etiology and treatment options for the condition in detail with the patient. Educated patient on the topical and oral treatment options for mycotic nails. Recommended debridement of the nails today. Sharp and mechanical debridement performed of all painful and mycotic nails today. Nails debrided in length and thickness using a nail nipper to level of  comfort. Discussed treatment options including appropriate shoe gear. Follow up as needed for painful nails.    Return in about 3 months (around 12/31/2022) for at risk diabetic foot care.

## 2022-10-04 ENCOUNTER — Other Ambulatory Visit: Payer: Self-pay

## 2022-10-09 ENCOUNTER — Telehealth: Payer: Self-pay | Admitting: Family Medicine

## 2022-10-09 ENCOUNTER — Other Ambulatory Visit: Payer: Self-pay | Admitting: Family

## 2022-10-09 DIAGNOSIS — I10 Essential (primary) hypertension: Secondary | ICD-10-CM

## 2022-10-09 MED ORDER — CARVEDILOL 25 MG PO TABS: ORAL_TABLET | ORAL | 1 refills | Status: DC

## 2022-10-09 NOTE — Telephone Encounter (Signed)
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  Prescription Request  10/09/2022  Is this a "Controlled Substance" medicine? No  LOV: Visit date not found  What is the name of the medication or equipment? carvedilol (COREG) 25 MG tablet  Have you contacted your pharmacy to request a refill? Yes   Which pharmacy would you like this sent to?  [id number] - [place], [place] - [address] [place] [address] Phone: [phone] Fax: [phone]    Patient notified that their request is being sent to the clinical staff for review and that they should receive a response within 2 business days.   Please advise at [facility] [phone]

## 2022-10-09 NOTE — Telephone Encounter (Signed)
Refilled coreg.

## 2022-10-10 ENCOUNTER — Other Ambulatory Visit: Payer: Self-pay | Admitting: Family Medicine

## 2022-10-10 DIAGNOSIS — M5412 Radiculopathy, cervical region: Secondary | ICD-10-CM

## 2022-10-10 DIAGNOSIS — R2689 Other abnormalities of gait and mobility: Secondary | ICD-10-CM | POA: Diagnosis not present

## 2022-10-10 DIAGNOSIS — M5030 Other cervical disc degeneration, unspecified cervical region: Secondary | ICD-10-CM | POA: Diagnosis not present

## 2022-10-10 DIAGNOSIS — M542 Cervicalgia: Secondary | ICD-10-CM | POA: Diagnosis not present

## 2022-10-10 NOTE — Telephone Encounter (Signed)
Pt is aware.  

## 2022-10-12 ENCOUNTER — Ambulatory Visit
Admission: RE | Admit: 2022-10-12 | Discharge: 2022-10-12 | Disposition: A | Discharge status: Home / Self Care | Payer: Medicare Other | Source: Ambulatory Visit | Attending: Family Medicine | Admitting: Family Medicine

## 2022-10-12 DIAGNOSIS — M5126 Other intervertebral disc displacement, lumbar region: Secondary | ICD-10-CM | POA: Diagnosis not present

## 2022-10-12 DIAGNOSIS — M5412 Radiculopathy, cervical region: Secondary | ICD-10-CM

## 2022-10-12 DIAGNOSIS — M5136 Other intervertebral disc degeneration, lumbar region: Secondary | ICD-10-CM | POA: Diagnosis not present

## 2022-10-16 ENCOUNTER — Ambulatory Visit (INDEPENDENT_AMBULATORY_CARE_PROVIDER_SITE_OTHER): Payer: Medicare Other | Admitting: Family Medicine

## 2022-10-16 ENCOUNTER — Encounter: Payer: Self-pay | Admitting: Family Medicine

## 2022-10-16 VITALS — BP 124/80 | HR 63 | Temp 97.9°F | Ht 64.0 in | Wt 184.4 lb

## 2022-10-16 DIAGNOSIS — M4802 Spinal stenosis, cervical region: Secondary | ICD-10-CM | POA: Diagnosis not present

## 2022-10-16 DIAGNOSIS — I6509 Occlusion and stenosis of unspecified vertebral artery: Secondary | ICD-10-CM | POA: Insufficient documentation

## 2022-10-16 DIAGNOSIS — Z23 Encounter for immunization: Secondary | ICD-10-CM | POA: Diagnosis not present

## 2022-10-16 DIAGNOSIS — I6501 Occlusion and stenosis of right vertebral artery: Secondary | ICD-10-CM | POA: Diagnosis not present

## 2022-10-16 LAB — BASIC METABOLIC PANEL
BUN: 26 mg/dL — ABNORMAL HIGH (ref 6–23)
CO2: 31 mEq/L (ref 19–32)
Calcium: 9.4 mg/dL (ref 8.4–10.5)
Chloride: 103 mEq/L (ref 96–112)
Creatinine, Ser: 1.31 mg/dL — ABNORMAL HIGH (ref 0.40–1.20)
GFR: 38.38 mL/min — ABNORMAL LOW (ref >60.00)
Glucose, Bld: 117 mg/dL — ABNORMAL HIGH (ref 70–99)
Potassium: 3.9 mEq/L (ref 3.5–5.1)
Sodium: 142 mEq/L (ref 135–145)

## 2022-10-16 NOTE — Progress Notes (Signed)
Tommi Rumps, MD Phone: [phone]  Vickie Kemp is a 82 y.o. female who presents today for f/u MRI.  Cervical DDD: Patient has been following with physical medicine and rehab.  She recently had MRI cervical spine that revealed several levels with spinal stenosis, DDD, and arthritis.  Incidentally they found absent flow void in the V1 and V2 segments of the right vertebral artery and noted this could be artifact versus stenosis or occlusion.  She has chronic neck and back pain.  She notes occasionally her left leg will fall asleep when she lays on that side though it improves with changing position.  She notes no focal weakness.  She does report having an injection in her back last year that helped for 2 weeks.  She notes pain in her back or neck when standing or walking.  She does note at times her legs feel heavy and she attributes that to using her compression stockings.  She notes no stroke history.  Social History   Tobacco Use  Smoking Status Never  Smokeless Tobacco Never    Current Outpatient Medications on File Prior to Visit  Medication Sig Dispense Refill   acetaminophen (TYLENOL) 650 MG CR tablet Take 650 mg by mouth 3 (three) times daily as needed.     amLODipine (NORVASC) 5 MG tablet Take 1 tablet (5 mg total) by mouth daily. 90 tablet 3   aspirin EC 81 MG tablet Take 81 mg by mouth daily.     atorvastatin (LIPITOR) 40 MG tablet Take 1 tablet (40 mg total) by mouth daily. Qhs 90 tablet 0   carvedilol (COREG) 25 MG tablet TAKE 1 TABLET(25 MG) BY MOUTH TWICE DAILY 180 tablet 1   Cholecalciferol (VITAMIN D3 PO) Take 1 capsule by mouth daily.     Continuous Blood Gluc Sensor (FREESTYLE LIBRE 2 SENSOR) MISC Apply 1 each topically every 14 (fourteen) days. 6 each 3   Empagliflozin-linaGLIPtin (GLYXAMBI) 10-5 MG TABS Take 1 tablet by mouth daily.     Empagliflozin-linaGLIPtin (GLYXAMBI) 10-5 MG TABS Take 1 tablet by mouth daily. 90 tablet 3   furosemide (LASIX) 40 MG tablet  Take 1 tablet (40 mg total) by mouth in the morning and at bedtime. (Patient taking differently: Take 40 mg by mouth daily.) 60 tablet 6   insulin degludec (TRESIBA FLEXTOUCH) 200 UNIT/ML FlexTouch Pen Inject 32 Units into the skin daily. D/c 56 units 9 mL 11   insulin degludec (TRESIBA) 200 UNIT/ML FlexTouch Pen Inject 32 units into the skin daily 27 mL 3   insulin lispro (HUMALOG KWIKPEN) 100 UNIT/ML KwikPen Inject 8 Units into the skin 3 (three) times daily. (Patient taking differently: Inject 8 Units into the skin daily as needed (glucose greater than 300).) 15 mL 1   Insulin Pen Needle (NOVOFINE PEN NEEDLE) 32G X 6 MM MISC Use one pen needle with Tresiba injection 200 each 3   Insulin Pen Needle 32G X 4 MM MISC 1 Device by Does not apply route 4 (four) times daily - after meals and at bedtime. 360 each 3   latanoprost (XALATAN) 0.005 % ophthalmic solution Place 1 drop into the left eye at bedtime.     losartan (COZAAR) 100 MG tablet Take 100 mg by mouth daily.     Omega-3 Fatty Acids (FISH OIL) 1000 MG CAPS Take 1,000 mg by mouth daily.     ONETOUCH ULTRA test strip USE TO CHECK BLOOD SUGAR THREE TIMES DAILY 300 strip 5   Turmeric (QC TUMERIC COMPLEX  PO) Take 1 capsule by mouth daily.     No current facility-administered medications on file prior to visit.     ROS see history of present illness  Objective  Physical Exam Vitals:   10/16/22 1400 10/16/22 1416  BP: 126/80 124/80  Pulse: 63   Temp: 97.9 F (36.6 C)   SpO2: 99%     BP Readings from Last 3 Encounters:  10/16/22 124/80  10/02/22 (!) 155/67  09/26/22 132/80   Wt Readings from Last 3 Encounters:  10/16/22 184 lb 6.4 oz (83.6 kg)  09/26/22 194 lb (88 kg)  05/08/22 189 lb (85.7 kg)    Physical Exam Constitutional:      General: She is not in acute distress.    Appearance: She is not diaphoretic.  Cardiovascular:     Rate and Rhythm: Normal rate and regular rhythm.     Heart sounds: Normal heart sounds.      Comments: No carotid bruits bilaterally Pulmonary:     Effort: Pulmonary effort is normal.     Breath sounds: Normal breath sounds.  Skin:    General: Skin is warm and dry.  Neurological:     Mental Status: She is alert.     Comments: 5/5 strength in bilateral biceps, triceps, grip, quads, hamstrings, plantar and dorsiflexion, sensation to light touch intact in bilateral UE and LE      Assessment/Plan: Please see individual problem list.  Cervical stenosis of spinal canal Assessment & Plan: Patient with cervical stenosis, degenerative disc disease, and arthritic changes in her neck.  I encouraged her to continue to follow-up with her physical medicine and rehab physician.   Stenosis of right vertebral artery Assessment & Plan: MRI was concerning for vertebral artery stenosis or occlusion.  Will get a CT angiogram of her neck to evaluate further.  BMP ordered to make sure her renal function will tolerate a CT angiogram.  Orders: -     Basic metabolic panel -     CT ANGIO NECK W OR WO CONTRAST; Future  Need for immunization against influenza -     Flu Vaccine QUAD High Dose(Fluad)    Return for As scheduled with Dr. Volanda Napoleon for transfer of care.   Tommi Rumps, MD [place]

## 2022-10-16 NOTE — Assessment & Plan Note (Signed)
Patient with cervical stenosis, degenerative disc disease, and arthritic changes in her neck.  I encouraged her to continue to follow-up with her physical medicine and rehab physician.

## 2022-10-16 NOTE — Assessment & Plan Note (Signed)
MRI was concerning for vertebral artery stenosis or occlusion.  Will get a CT angiogram of her neck to evaluate further.  BMP ordered to make sure her renal function will tolerate a CT angiogram.

## 2022-10-16 NOTE — Patient Instructions (Signed)
Nice to see you. Somebody should contact you to schedule your CT angiogram.  If you do not hear from somebody in the next week or 2 please let us know.

## 2022-10-27 ENCOUNTER — Ambulatory Visit
Admission: RE | Admit: 2022-10-27 | Discharge: 2022-10-27 | Disposition: A | Discharge status: Home / Self Care | Payer: Medicare Other | Source: Ambulatory Visit | Attending: Family Medicine | Admitting: Family Medicine

## 2022-10-27 DIAGNOSIS — I6501 Occlusion and stenosis of right vertebral artery: Secondary | ICD-10-CM | POA: Diagnosis present

## 2022-10-27 MED ORDER — IOHEXOL 350 MG/ML SOLN
60.0000 mL | Freq: Once | INTRAVENOUS | Status: AC | PRN
  Administered 2022-10-27: 60 mL via INTRAVENOUS

## 2022-11-01 ENCOUNTER — Telehealth: Payer: Self-pay

## 2022-11-01 NOTE — Telephone Encounter (Signed)
-----   Message from Leone Haven, MD sent at 11/01/2022 10:09 AM EDT ----- Please let the patient know that her CT scan did not reveal any significant stenosis in her carotid arteries or vertebral arteries.  They did happen to see a thyroid nodule.  They recommended a thyroid ultrasound for this.  I can order this once you speak with her.  The ultrasound would be able to tell if this is a worrisome nodule or if it is a benign nodule.

## 2022-11-01 NOTE — Telephone Encounter (Signed)
Attempted to call Patient to give her the CT results and Dr. Ellen Henri recommendations.

## 2022-11-02 ENCOUNTER — Other Ambulatory Visit: Payer: Self-pay | Admitting: Family Medicine

## 2022-11-02 ENCOUNTER — Telehealth: Payer: Self-pay | Admitting: *Deleted

## 2022-11-02 ENCOUNTER — Encounter: Payer: Self-pay | Admitting: *Deleted

## 2022-11-02 DIAGNOSIS — E041 Nontoxic single thyroid nodule: Secondary | ICD-10-CM

## 2022-11-02 NOTE — Telephone Encounter (Signed)
Pt called back & is aware of results. See result for more info.

## 2022-11-02 NOTE — Telephone Encounter (Signed)
Please let the patient know that her CT scan did not reveal any significant stenosis in her carotid arteries or vertebral arteries.  They did happen to see a thyroid nodule.  They recommended a thyroid ultrasound for this.  I can order this once you speak with her.  The ultrasound would be able to tell if this is a worrisome nodule or if it is a benign nodule.

## 2022-11-03 NOTE — Telephone Encounter (Signed)
Pt returned [place] call concerning ultrasound results. Unable to transfer. Pt available @[phone].

## 2022-11-03 NOTE — Telephone Encounter (Signed)
Called Patient with CT scan results. Patient would like to proceed with the thyroid scan.

## 2022-11-09 ENCOUNTER — Ambulatory Visit: Payer: Medicare Other | Admitting: Physician Assistant

## 2022-11-13 ENCOUNTER — Ambulatory Visit
Admission: RE | Admit: 2022-11-13 | Discharge: 2022-11-13 | Disposition: A | Discharge status: Home / Self Care | Payer: Medicare Other | Source: Ambulatory Visit | Attending: Family Medicine | Admitting: Family Medicine

## 2022-11-13 DIAGNOSIS — E041 Nontoxic single thyroid nodule: Secondary | ICD-10-CM | POA: Diagnosis present

## 2022-11-15 ENCOUNTER — Encounter: Payer: Medicare Other | Admitting: Family Medicine

## 2022-11-16 ENCOUNTER — Other Ambulatory Visit: Payer: Self-pay | Admitting: Family Medicine

## 2022-11-16 DIAGNOSIS — E041 Nontoxic single thyroid nodule: Secondary | ICD-10-CM

## 2022-11-20 ENCOUNTER — Inpatient Hospital Stay
Admission: EM | Admit: 2022-11-20 | Discharge: 2022-11-22 | DRG: 558 | Disposition: A | Discharge status: Home Health | Payer: Medicare Other | Attending: Family Medicine | Admitting: Family Medicine

## 2022-11-20 ENCOUNTER — Emergency Department: Payer: Medicare Other

## 2022-11-20 DIAGNOSIS — R251 Tremor, unspecified: Secondary | ICD-10-CM | POA: Diagnosis present

## 2022-11-20 DIAGNOSIS — Y92009 Unspecified place in unspecified non-institutional (private) residence as the place of occurrence of the external cause: Secondary | ICD-10-CM

## 2022-11-20 DIAGNOSIS — I6523 Occlusion and stenosis of bilateral carotid arteries: Secondary | ICD-10-CM | POA: Diagnosis present

## 2022-11-20 DIAGNOSIS — N390 Urinary tract infection, site not specified: Secondary | ICD-10-CM

## 2022-11-20 DIAGNOSIS — E041 Nontoxic single thyroid nodule: Secondary | ICD-10-CM | POA: Insufficient documentation

## 2022-11-20 DIAGNOSIS — I129 Hypertensive chronic kidney disease with stage 1 through stage 4 chronic kidney disease, or unspecified chronic kidney disease: Secondary | ICD-10-CM | POA: Diagnosis present

## 2022-11-20 DIAGNOSIS — I440 Atrioventricular block, first degree: Secondary | ICD-10-CM | POA: Diagnosis present

## 2022-11-20 DIAGNOSIS — E1122 Type 2 diabetes mellitus with diabetic chronic kidney disease: Secondary | ICD-10-CM

## 2022-11-20 DIAGNOSIS — E669 Obesity, unspecified: Secondary | ICD-10-CM | POA: Diagnosis present

## 2022-11-20 DIAGNOSIS — E785 Hyperlipidemia, unspecified: Secondary | ICD-10-CM | POA: Insufficient documentation

## 2022-11-20 DIAGNOSIS — Z833 Family history of diabetes mellitus: Secondary | ICD-10-CM

## 2022-11-20 DIAGNOSIS — I10 Essential (primary) hypertension: Secondary | ICD-10-CM | POA: Diagnosis present

## 2022-11-20 DIAGNOSIS — F419 Anxiety disorder, unspecified: Secondary | ICD-10-CM | POA: Diagnosis present

## 2022-11-20 DIAGNOSIS — Z6832 Body mass index (BMI) 32.0-32.9, adult: Secondary | ICD-10-CM

## 2022-11-20 DIAGNOSIS — Z79899 Other long term (current) drug therapy: Secondary | ICD-10-CM

## 2022-11-20 DIAGNOSIS — E8881 Metabolic syndrome: Secondary | ICD-10-CM | POA: Diagnosis present

## 2022-11-20 DIAGNOSIS — R7982 Elevated C-reactive protein (CRP): Secondary | ICD-10-CM | POA: Diagnosis present

## 2022-11-20 DIAGNOSIS — Z794 Long term (current) use of insulin: Secondary | ICD-10-CM

## 2022-11-20 DIAGNOSIS — E1140 Type 2 diabetes mellitus with diabetic neuropathy, unspecified: Secondary | ICD-10-CM | POA: Diagnosis present

## 2022-11-20 DIAGNOSIS — I872 Venous insufficiency (chronic) (peripheral): Secondary | ICD-10-CM | POA: Diagnosis present

## 2022-11-20 DIAGNOSIS — M4803 Spinal stenosis, cervicothoracic region: Secondary | ICD-10-CM | POA: Diagnosis present

## 2022-11-20 DIAGNOSIS — I890 Lymphedema, not elsewhere classified: Secondary | ICD-10-CM | POA: Diagnosis present

## 2022-11-20 DIAGNOSIS — Z880 Allergy status to penicillin: Secondary | ICD-10-CM

## 2022-11-20 DIAGNOSIS — M6282 Rhabdomyolysis: Secondary | ICD-10-CM | POA: Diagnosis not present

## 2022-11-20 DIAGNOSIS — G8929 Other chronic pain: Secondary | ICD-10-CM | POA: Diagnosis present

## 2022-11-20 DIAGNOSIS — Z8249 Family history of ischemic heart disease and other diseases of the circulatory system: Secondary | ICD-10-CM

## 2022-11-20 DIAGNOSIS — H5462 Unqualified visual loss, left eye, normal vision right eye: Secondary | ICD-10-CM | POA: Diagnosis present

## 2022-11-20 DIAGNOSIS — R262 Difficulty in walking, not elsewhere classified: Secondary | ICD-10-CM | POA: Insufficient documentation

## 2022-11-20 DIAGNOSIS — Z7982 Long term (current) use of aspirin: Secondary | ICD-10-CM

## 2022-11-20 DIAGNOSIS — T796XXA Traumatic ischemia of muscle, initial encounter: Principal | ICD-10-CM

## 2022-11-20 DIAGNOSIS — G4733 Obstructive sleep apnea (adult) (pediatric): Secondary | ICD-10-CM | POA: Diagnosis present

## 2022-11-20 DIAGNOSIS — R29810 Facial weakness: Secondary | ICD-10-CM | POA: Diagnosis present

## 2022-11-20 DIAGNOSIS — N1832 Chronic kidney disease, stage 3b: Secondary | ICD-10-CM | POA: Diagnosis present

## 2022-11-20 DIAGNOSIS — R32 Unspecified urinary incontinence: Secondary | ICD-10-CM | POA: Diagnosis present

## 2022-11-20 DIAGNOSIS — M5033 Other cervical disc degeneration, cervicothoracic region: Secondary | ICD-10-CM | POA: Diagnosis present

## 2022-11-20 DIAGNOSIS — R159 Full incontinence of feces: Secondary | ICD-10-CM | POA: Diagnosis present

## 2022-11-20 DIAGNOSIS — I2510 Atherosclerotic heart disease of native coronary artery without angina pectoris: Secondary | ICD-10-CM | POA: Diagnosis present

## 2022-11-20 DIAGNOSIS — W19XXXA Unspecified fall, initial encounter: Secondary | ICD-10-CM | POA: Diagnosis present

## 2022-11-20 DIAGNOSIS — Z9861 Coronary angioplasty status: Secondary | ICD-10-CM

## 2022-11-20 MED ORDER — ACETAMINOPHEN 500 MG PO TABS
1000.0000 mg | ORAL_TABLET | Freq: Once | ORAL | Status: AC
  Administered 2022-11-21: 1000 mg via ORAL
  Filled 2022-11-20: qty 2

## 2022-11-20 NOTE — ED Provider Notes (Signed)
   [facility] Provider Note    Event Date/Time   First MD Initiated Contact with Patient 11/20/22 2316     (approximate)   History   No chief complaint on file.   HPI  Vickie Kemp is a 82 y.o. female who presents to the ED for evaluation of No chief complaint on file.   I reviewed PCP visit from 3/4.  History of cervical stenosis, HTN, HLD, DM on insulin, lymphedema.  Patient presents to the ED from home via EMS for evaluation of a fall.  She reportedly fell around 5 or 6 PM.  She reports a mechanical fall where her left leg "was not listening" and "fell behind" causing her to stumble to the ground.  She reports that she may have blacked out on the way down but is uncertain.  She was incontinent of urine around this time.  She denies any pain such as head, chest, back pain.  She was unable to get up as she lives at home by herself and her sister eventually realized that patient was not answering the phone and sister called 911.   Physical Exam   Triage Vital Signs: ED Triage Vitals  Enc Vitals Group     BP      Pulse      Resp      Temp      Temp src      SpO2      Weight      Height      Head Circumference      Peak Flow      Pain Score      Pain Loc      Pain Edu?      Excl. in GC?     Most recent vital signs: There were no vitals filed for this visit.  General: Awake, no distress.  Pleasant and conversational CV:  Good peripheral perfusion.  Resp:  Normal effort.  Abd:  No distention.  Soft.  Incontinent of urine MSK:  No deformity noted.  Palpation of all 4 extremities without evidence of deformity, tenderness or trauma Neuro:  No focal deficits appreciated. Cranial nerves II through XII intact 5/5 strength and sensation in all 4 extremities Other:     ED Results / Procedures / Treatments   Labs (all labs ordered are listed, but only abnormal results are displayed) Labs Reviewed - No data to  display  EKG ***  RADIOLOGY ***  Official radiology report(s): No results found.  PROCEDURES and INTERVENTIONS:  Procedures  Medications - No data to display   IMPRESSION / MDM / ASSESSMENT AND PLAN / ED COURSE  I reviewed the triage vital signs and the nursing notes.  Differential diagnosis includes, but is not limited to, ***  {Patient presents with symptoms of an acute illness or injury that is potentially life-threatening.}      FINAL CLINICAL IMPRESSION(S) / ED DIAGNOSES   Final diagnoses:  None     Rx / DC Orders   ED Discharge Orders     None        Note:  This document was prepared using Dragon voice recognition software and may include unintentional dictation errors.

## 2022-11-20 NOTE — ED Triage Notes (Signed)
Patient fell at home after ambulating to the bathroom around 6:00pm. She was unable to get up by herself or call for help. Her sister came to her house and found her on the floor around 10:45pm after she had not been answering her phone. Patient states that she did feel dizzy right before she fell. She is c/o back pain at this time.

## 2022-11-21 ENCOUNTER — Observation Stay: Payer: Medicare Other

## 2022-11-21 ENCOUNTER — Emergency Department: Payer: Medicare Other

## 2022-11-21 ENCOUNTER — Observation Stay (HOSPITAL_COMMUNITY)
Admit: 2022-11-21 | Discharge: 2022-11-21 | Disposition: A | Discharge status: Home / Self Care | Payer: Medicare Other | Attending: Family Medicine | Admitting: Family Medicine

## 2022-11-21 ENCOUNTER — Other Ambulatory Visit: Payer: Self-pay

## 2022-11-21 ENCOUNTER — Encounter: Payer: Self-pay | Admitting: Family Medicine

## 2022-11-21 DIAGNOSIS — E1122 Type 2 diabetes mellitus with diabetic chronic kidney disease: Secondary | ICD-10-CM | POA: Diagnosis present

## 2022-11-21 DIAGNOSIS — Z8249 Family history of ischemic heart disease and other diseases of the circulatory system: Secondary | ICD-10-CM | POA: Diagnosis not present

## 2022-11-21 DIAGNOSIS — T796XXA Traumatic ischemia of muscle, initial encounter: Secondary | ICD-10-CM | POA: Diagnosis not present

## 2022-11-21 DIAGNOSIS — E785 Hyperlipidemia, unspecified: Secondary | ICD-10-CM | POA: Insufficient documentation

## 2022-11-21 DIAGNOSIS — I2510 Atherosclerotic heart disease of native coronary artery without angina pectoris: Secondary | ICD-10-CM | POA: Diagnosis present

## 2022-11-21 DIAGNOSIS — R55 Syncope and collapse: Secondary | ICD-10-CM

## 2022-11-21 DIAGNOSIS — E1140 Type 2 diabetes mellitus with diabetic neuropathy, unspecified: Secondary | ICD-10-CM | POA: Diagnosis present

## 2022-11-21 DIAGNOSIS — W19XXXA Unspecified fall, initial encounter: Secondary | ICD-10-CM | POA: Diagnosis present

## 2022-11-21 DIAGNOSIS — N1832 Chronic kidney disease, stage 3b: Secondary | ICD-10-CM | POA: Diagnosis present

## 2022-11-21 DIAGNOSIS — R262 Difficulty in walking, not elsewhere classified: Secondary | ICD-10-CM | POA: Diagnosis not present

## 2022-11-21 DIAGNOSIS — E669 Obesity, unspecified: Secondary | ICD-10-CM | POA: Diagnosis present

## 2022-11-21 DIAGNOSIS — E8881 Metabolic syndrome: Secondary | ICD-10-CM | POA: Diagnosis present

## 2022-11-21 DIAGNOSIS — M6282 Rhabdomyolysis: Secondary | ICD-10-CM | POA: Diagnosis present

## 2022-11-21 DIAGNOSIS — I440 Atrioventricular block, first degree: Secondary | ICD-10-CM | POA: Diagnosis present

## 2022-11-21 DIAGNOSIS — Y92009 Unspecified place in unspecified non-institutional (private) residence as the place of occurrence of the external cause: Secondary | ICD-10-CM

## 2022-11-21 DIAGNOSIS — Z794 Long term (current) use of insulin: Secondary | ICD-10-CM

## 2022-11-21 DIAGNOSIS — Z833 Family history of diabetes mellitus: Secondary | ICD-10-CM | POA: Diagnosis not present

## 2022-11-21 DIAGNOSIS — H5462 Unqualified visual loss, left eye, normal vision right eye: Secondary | ICD-10-CM | POA: Diagnosis present

## 2022-11-21 DIAGNOSIS — F419 Anxiety disorder, unspecified: Secondary | ICD-10-CM | POA: Diagnosis present

## 2022-11-21 DIAGNOSIS — I129 Hypertensive chronic kidney disease with stage 1 through stage 4 chronic kidney disease, or unspecified chronic kidney disease: Secondary | ICD-10-CM | POA: Diagnosis present

## 2022-11-21 DIAGNOSIS — R32 Unspecified urinary incontinence: Secondary | ICD-10-CM | POA: Diagnosis present

## 2022-11-21 DIAGNOSIS — E041 Nontoxic single thyroid nodule: Secondary | ICD-10-CM | POA: Diagnosis present

## 2022-11-21 DIAGNOSIS — N390 Urinary tract infection, site not specified: Secondary | ICD-10-CM | POA: Diagnosis not present

## 2022-11-21 DIAGNOSIS — Z9861 Coronary angioplasty status: Secondary | ICD-10-CM | POA: Diagnosis not present

## 2022-11-21 DIAGNOSIS — Z7982 Long term (current) use of aspirin: Secondary | ICD-10-CM | POA: Diagnosis not present

## 2022-11-21 DIAGNOSIS — G4733 Obstructive sleep apnea (adult) (pediatric): Secondary | ICD-10-CM | POA: Diagnosis present

## 2022-11-21 DIAGNOSIS — I6523 Occlusion and stenosis of bilateral carotid arteries: Secondary | ICD-10-CM | POA: Diagnosis present

## 2022-11-21 DIAGNOSIS — Z6832 Body mass index (BMI) 32.0-32.9, adult: Secondary | ICD-10-CM | POA: Diagnosis not present

## 2022-11-21 DIAGNOSIS — M4803 Spinal stenosis, cervicothoracic region: Secondary | ICD-10-CM | POA: Diagnosis present

## 2022-11-21 DIAGNOSIS — I872 Venous insufficiency (chronic) (peripheral): Secondary | ICD-10-CM | POA: Diagnosis present

## 2022-11-21 LAB — URINALYSIS, ROUTINE W REFLEX MICROSCOPIC
Bilirubin Urine: NEGATIVE
Glucose, UA: >=500 mg/dL — AB
Hgb urine dipstick: NEGATIVE
Ketones, ur: 5 mg/dL — AB
Nitrite: NEGATIVE
Protein, ur: NEGATIVE mg/dL
Specific Gravity, Urine: 1.013 (ref 1.005–1.030)
pH: 5 (ref 5.0–8.0)

## 2022-11-21 LAB — CBG MONITORING, ED
Glucose-Capillary: 68 mg/dL — ABNORMAL LOW (ref 70–99)
Glucose-Capillary: 75 mg/dL (ref 70–99)
Glucose-Capillary: 90 mg/dL (ref 70–99)
Glucose-Capillary: 71 mg/dL (ref 70–99)

## 2022-11-21 LAB — CBC
HCT: 38.8 % (ref 36.0–46.0)
Hemoglobin: 12.2 g/dL (ref 12.0–15.0)
MCH: 28.8 pg (ref 26.0–34.0)
MCHC: 31.4 g/dL (ref 30.0–36.0)
MCV: 91.5 fL (ref 80.0–100.0)
Platelets: 172 10*3/uL (ref 150–400)
RBC: 4.24 MIL/uL (ref 3.87–5.11)
RDW: 14.6 % (ref 11.5–15.5)
WBC: 10.2 10*3/uL (ref 4.0–10.5)
nRBC: 0 % (ref 0.0–0.2)

## 2022-11-21 LAB — CBC WITH DIFFERENTIAL/PLATELET
Abs Immature Granulocytes: 0.02 10*3/uL (ref 0.00–0.07)
Basophils Absolute: 0.1 10*3/uL (ref 0.0–0.1)
Basophils Relative: 1 %
Eosinophils Absolute: 0.1 10*3/uL (ref 0.0–0.5)
Eosinophils Relative: 0 %
HCT: 44.8 % (ref 36.0–46.0)
Hemoglobin: 13.7 g/dL (ref 12.0–15.0)
Immature Granulocytes: 0 %
Lymphocytes Relative: 18 %
Lymphs Abs: 2.1 10*3/uL (ref 0.7–4.0)
MCH: 28.2 pg (ref 26.0–34.0)
MCHC: 30.6 g/dL (ref 30.0–36.0)
MCV: 92.2 fL (ref 80.0–100.0)
Monocytes Absolute: 1 10*3/uL (ref 0.1–1.0)
Monocytes Relative: 8 %
Neutro Abs: 8.4 10*3/uL — ABNORMAL HIGH (ref 1.7–7.7)
Neutrophils Relative %: 73 %
Platelets: 175 10*3/uL (ref 150–400)
RBC: 4.86 MIL/uL (ref 3.87–5.11)
RDW: 14.4 % (ref 11.5–15.5)
WBC: 11.6 10*3/uL — ABNORMAL HIGH (ref 4.0–10.5)
nRBC: 0 % (ref 0.0–0.2)

## 2022-11-21 LAB — COMPREHENSIVE METABOLIC PANEL
ALT: 16 U/L (ref 0–44)
AST: 39 U/L (ref 15–41)
Albumin: 3.7 g/dL (ref 3.5–5.0)
Alkaline Phosphatase: 92 U/L (ref 38–126)
Anion gap: 11 (ref 5–15)
BUN: 26 mg/dL — ABNORMAL HIGH (ref 8–23)
CO2: 22 mmol/L (ref 22–32)
Calcium: 9.4 mg/dL (ref 8.9–10.3)
Chloride: 106 mmol/L (ref 98–111)
Creatinine, Ser: 1.18 mg/dL — ABNORMAL HIGH (ref 0.44–1.00)
GFR, Estimated: 47 mL/min — ABNORMAL LOW (ref >60)
Glucose, Bld: 92 mg/dL (ref 70–99)
Potassium: 3.5 mmol/L (ref 3.5–5.1)
Sodium: 139 mmol/L (ref 135–145)
Total Bilirubin: 1.1 mg/dL (ref 0.3–1.2)
Total Protein: 7.8 g/dL (ref 6.5–8.1)

## 2022-11-21 LAB — TSH: TSH: 0.536 u[IU]/mL (ref 0.350–4.500)

## 2022-11-21 LAB — LIPID PANEL
Cholesterol: 114 mg/dL (ref 0–200)
HDL: 31 mg/dL — ABNORMAL LOW (ref >40)
LDL Cholesterol: 77 mg/dL (ref 0–99)
Total CHOL/HDL Ratio: 3.7 RATIO
Triglycerides: 32 mg/dL (ref <150)
VLDL: 6 mg/dL (ref 0–40)

## 2022-11-21 LAB — BASIC METABOLIC PANEL
Anion gap: 8 (ref 5–15)
BUN: 24 mg/dL — ABNORMAL HIGH (ref 8–23)
CO2: 23 mmol/L (ref 22–32)
Calcium: 8.8 mg/dL — ABNORMAL LOW (ref 8.9–10.3)
Chloride: 109 mmol/L (ref 98–111)
Creatinine, Ser: 1.06 mg/dL — ABNORMAL HIGH (ref 0.44–1.00)
GFR, Estimated: 53 mL/min — ABNORMAL LOW (ref >60)
Glucose, Bld: 76 mg/dL (ref 70–99)
Potassium: 3.2 mmol/L — ABNORMAL LOW (ref 3.5–5.1)
Sodium: 140 mmol/L (ref 135–145)

## 2022-11-21 LAB — CK
Total CK: 1332 U/L — ABNORMAL HIGH (ref 38–234)
Total CK: 845 U/L — ABNORMAL HIGH (ref 38–234)

## 2022-11-21 LAB — ECHOCARDIOGRAM COMPLETE
AR max vel: 2.18 cm2
AV Area VTI: 2.65 cm2
AV Area mean vel: 2.38 cm2
AV Mean grad: 4 mmHg
AV Peak grad: 8.3 mmHg
Ao pk vel: 1.44 m/s
Area-P 1/2: 3.07 cm2
MV VTI: 2.38 cm2
S' Lateral: 2.6 cm
Weight: 3060.8 oz

## 2022-11-21 LAB — GLUCOSE, CAPILLARY
Glucose-Capillary: 137 mg/dL — ABNORMAL HIGH (ref 70–99)
Glucose-Capillary: 122 mg/dL — ABNORMAL HIGH (ref 70–99)

## 2022-11-21 LAB — MAGNESIUM: Magnesium: 2 mg/dL (ref 1.7–2.4)

## 2022-11-21 LAB — SEDIMENTATION RATE: Sed Rate: 17 mm/hr (ref 0–30)

## 2022-11-21 LAB — FOLATE: Folate: 20.4 ng/mL (ref >5.9)

## 2022-11-21 MED ORDER — LINAGLIPTIN 5 MG PO TABS
5.0000 mg | ORAL_TABLET | Freq: Every day | ORAL | Status: DC
  Filled 2022-11-21: qty 1

## 2022-11-21 MED ORDER — EMPAGLIFLOZIN-LINAGLIPTIN 10-5 MG PO TABS: 1.0000 | ORAL_TABLET | Freq: Every day | ORAL | Status: DC

## 2022-11-21 MED ORDER — ACETAMINOPHEN 325 MG PO TABS: 650.0000 mg | ORAL_TABLET | Freq: Four times a day (QID) | ORAL | Status: DC | PRN

## 2022-11-21 MED ORDER — LACTATED RINGERS IV BOLUS
1000.0000 mL | Freq: Once | INTRAVENOUS | Status: AC
  Administered 2022-11-21: 1000 mL via INTRAVENOUS

## 2022-11-21 MED ORDER — ATORVASTATIN CALCIUM 20 MG PO TABS: 40.0000 mg | ORAL_TABLET | Freq: Every day | ORAL | Status: DC

## 2022-11-21 MED ORDER — ASPIRIN 81 MG PO TBEC
81.0000 mg | DELAYED_RELEASE_TABLET | Freq: Every day | ORAL | Status: DC
  Administered 2022-11-21 – 2022-11-22 (×2): 81 mg via ORAL
  Filled 2022-11-21 (×2): qty 1

## 2022-11-21 MED ORDER — INSULIN GLARGINE-YFGN 100 UNIT/ML ~~LOC~~ SOLN
32.0000 [IU] | Freq: Every day | SUBCUTANEOUS | Status: DC
  Filled 2022-11-21: qty 0.32

## 2022-11-21 MED ORDER — SODIUM CHLORIDE 0.9 % IV SOLN
1.0000 g | INTRAVENOUS | Status: DC
  Administered 2022-11-21 – 2022-11-22 (×2): 1 g via INTRAVENOUS
  Filled 2022-11-21: qty 10
  Filled 2022-11-21: qty 1

## 2022-11-21 MED ORDER — ONDANSETRON HCL 4 MG/2ML IJ SOLN: 4.0000 mg | Freq: Four times a day (QID) | INTRAMUSCULAR | Status: DC | PRN

## 2022-11-21 MED ORDER — INSULIN ASPART 100 UNIT/ML IJ SOLN
0.0000 [IU] | INTRAMUSCULAR | Status: DC
  Administered 2022-11-21 (×2): 1 [IU] via SUBCUTANEOUS
  Administered 2022-11-22: 2 [IU] via SUBCUTANEOUS
  Administered 2022-11-22: 3 [IU] via SUBCUTANEOUS
  Filled 2022-11-21 (×4): qty 1

## 2022-11-21 MED ORDER — MAGNESIUM HYDROXIDE 400 MG/5ML PO SUSP
30.0000 mL | Freq: Every day | ORAL | Status: DC | PRN
  Administered 2022-11-22: 30 mL via ORAL
  Filled 2022-11-21: qty 30

## 2022-11-21 MED ORDER — CARVEDILOL 25 MG PO TABS
25.0000 mg | ORAL_TABLET | Freq: Two times a day (BID) | ORAL | Status: DC
  Administered 2022-11-21 – 2022-11-22 (×3): 25 mg via ORAL
  Filled 2022-11-21 (×3): qty 1
  Filled 2022-11-21: qty 4

## 2022-11-21 MED ORDER — ACETAMINOPHEN 650 MG RE SUPP: 650.0000 mg | Freq: Four times a day (QID) | RECTAL | Status: DC | PRN

## 2022-11-21 MED ORDER — POTASSIUM CHLORIDE IN NACL 20-0.9 MEQ/L-% IV SOLN
INTRAVENOUS | Status: DC
  Filled 2022-11-21 (×5): qty 1000

## 2022-11-21 MED ORDER — EMPAGLIFLOZIN 10 MG PO TABS
10.0000 mg | ORAL_TABLET | Freq: Every day | ORAL | Status: DC
  Filled 2022-11-21: qty 1

## 2022-11-21 MED ORDER — INSULIN GLARGINE-YFGN 100 UNIT/ML ~~LOC~~ SOLN
20.0000 [IU] | Freq: Every day | SUBCUTANEOUS | Status: DC
  Filled 2022-11-21 (×2): qty 0.2

## 2022-11-21 MED ORDER — AMLODIPINE BESYLATE 5 MG PO TABS
5.0000 mg | ORAL_TABLET | Freq: Every day | ORAL | Status: DC
  Administered 2022-11-21 – 2022-11-22 (×2): 5 mg via ORAL
  Filled 2022-11-21 (×2): qty 1

## 2022-11-21 MED ORDER — FUROSEMIDE 40 MG PO TABS
40.0000 mg | ORAL_TABLET | Freq: Every day | ORAL | Status: DC
  Filled 2022-11-21 (×2): qty 1

## 2022-11-21 MED ORDER — LOSARTAN POTASSIUM 50 MG PO TABS
100.0000 mg | ORAL_TABLET | Freq: Every day | ORAL | Status: DC
  Administered 2022-11-21 – 2022-11-22 (×2): 100 mg via ORAL
  Filled 2022-11-21 (×2): qty 2

## 2022-11-21 MED ORDER — OMEGA-3-ACID ETHYL ESTERS 1 G PO CAPS
1.0000 g | ORAL_CAPSULE | Freq: Every day | ORAL | Status: DC
  Administered 2022-11-21 – 2022-11-22 (×2): 1 g via ORAL
  Filled 2022-11-21 (×3): qty 1

## 2022-11-21 MED ORDER — ENOXAPARIN SODIUM 60 MG/0.6ML IJ SOSY
0.5000 mg/kg | PREFILLED_SYRINGE | INTRAMUSCULAR | Status: DC
  Administered 2022-11-22: 42.5 mg via SUBCUTANEOUS
  Filled 2022-11-21 (×2): qty 0.6

## 2022-11-21 MED ORDER — TRAZODONE HCL 50 MG PO TABS: 25.0000 mg | ORAL_TABLET | Freq: Every evening | ORAL | Status: DC | PRN

## 2022-11-21 MED ORDER — HYDRALAZINE HCL 20 MG/ML IJ SOLN
10.0000 mg | Freq: Four times a day (QID) | INTRAMUSCULAR | Status: DC | PRN
  Administered 2022-11-21 – 2022-11-22 (×2): 10 mg via INTRAVENOUS
  Filled 2022-11-21 (×2): qty 1

## 2022-11-21 MED ORDER — LATANOPROST 0.005 % OP SOLN
1.0000 [drp] | Freq: Every day | OPHTHALMIC | Status: DC
  Administered 2022-11-21: 1 [drp] via OPHTHALMIC
  Filled 2022-11-21: qty 2.5

## 2022-11-21 MED ORDER — VITAMIN D 25 MCG (1000 UNIT) PO TABS
1000.0000 [IU] | ORAL_TABLET | Freq: Every day | ORAL | Status: DC
  Administered 2022-11-21 – 2022-11-22 (×2): 1000 [IU] via ORAL
  Filled 2022-11-21 (×2): qty 1

## 2022-11-21 MED ORDER — POTASSIUM CHLORIDE CRYS ER 20 MEQ PO TBCR
20.0000 meq | EXTENDED_RELEASE_TABLET | Freq: Once | ORAL | Status: AC
  Administered 2022-11-21: 20 meq via ORAL
  Filled 2022-11-21: qty 1

## 2022-11-21 MED ORDER — ONDANSETRON HCL 4 MG PO TABS: 4.0000 mg | ORAL_TABLET | Freq: Four times a day (QID) | ORAL | Status: DC | PRN

## 2022-11-21 NOTE — Assessment & Plan Note (Signed)
-   The patient will be placed on supplement coverage with NovoLog. ?- We will continue basal coverage. ? ?

## 2022-11-21 NOTE — Progress Notes (Signed)
Anticoagulation monitoring(Lovenox):  82 yo female ordered Lovenox 40 mg Q24h    Filed Weights   11/20/22 2335  Weight: 86.8 kg (191 lb 4.8 oz)   BMI 32.8    Lab Results  Component Value Date   CREATININE 1.18 (H) 11/20/2022   CREATININE 1.31 (H) 10/16/2022   CREATININE 1.37 (H) 09/27/2021   Estimated Creatinine Clearance: 40.5 mL/min (A) (by C-G formula based on SCr of 1.18 mg/dL (H)). Hemoglobin & Hematocrit     Component Value Date/Time   HGB 13.7 11/21/2022 0032   HCT 44.8 11/21/2022 0032     Per Protocol for Patient with estCrcl > 30 ml/min and BMI > 30, will transition to Lovenox 42.5 mg Q24h.

## 2022-11-21 NOTE — ED Notes (Signed)
Gave patient 8 ounces of apple juice due to blood sugar value of 68. Will recheck in approx 20 minutes

## 2022-11-21 NOTE — Assessment & Plan Note (Addendum)
multifactorial gait disorder secondary to known degenerative disc disease, with overlaid neuropathy secondary to diabetes and metabolic syndrome, as well as anxiety/fear of falling    MRI brain w/o contrast ordered and reviewed as above  - MRA head w/o contrast  -Initial workup of neuropathy to include A1c, B12, TSH, ANA, ESR, SPEP, UPEP, IFE, HIV,  -Myelopathy labs: zinc, copper, B12, MMA, folate, Vitamin E -Encouraged patient to use CPAP

## 2022-11-21 NOTE — Assessment & Plan Note (Signed)
-   This is suspected from her UA and will need to be confirmed with urine culture and sensitivity. - We will place her for now on IV Rocephin and follow urine culture.

## 2022-11-21 NOTE — ED Notes (Signed)
Clarified NS with KCI 20 infusion with Dr. Arville Care, as patient's potassium value is 3.5. He does want this medication administered

## 2022-11-21 NOTE — Progress Notes (Addendum)
PROGRESS NOTE   HPI was taken from Dr. Arville Care: Vickie Kemp is a 82 y.o. African-American female with medical history significant for coronary artery disease, stage IIIb chronic kidney disease, type 2 diabetes mellitus, and OSA, who presented to the ER with acute onset of fall with generalized weakness.  The patient thought her right foot was not communicating with the body and admitted to collapse without loss of consciousness.  She was noted to have urinary incontinence however her daughter stated that this is chronic.  No fever or chills.  No nausea or vomiting or abdominal pain.  No headache or dizziness or blurred vision no unilateral paresthesias or focal muscle weakness.  No tinnitus or vertigo.  No chest pain or palpitations.  No dysuria, oliguria or hematuria or flank pain.  She stayed on the ground from 6 PM till 10:30 PM.  Her sister Malachi Bonds Major is her power of attorney and was with her in the ER.   ED Course: When she came to the ER, BP was 182/75 with respiratory rate of 22 with otherwise normal vital signs.  Labs revealed  borderline potassium of 3.5 and a BUN 26 creatinine of 1.18, CK of 845 and CBC showed leukocytosis of 11.6.  UA showed more than 500 glucose and trace leukocytes with few bacteria and 6-10 WBCs. EKG as reviewed by me : EKG showed sinus rhythm with a rate of 83 with prolonged PR interval and low voltage QRS Imaging: Portable chest x-ray showed no acute cardiopulmonary disease.  Noncontrast head CT scan revealed no acute intracranial normalities and C-spine CT showed no acute findings.  It showed multi level marked severity degenerative changes as well as enlarged and heterogenous thyroid gland with follow-up per with nonemergent ultrasound recommended.   The patient was given 1 g of p.o. Tylenol and 1 L bolus of IV lactated Ringer.  She will be admitted to a medical telemetry observation bed for further evaluation and management.    Vickie Kemp  OTL:[id number] DOB:  1941-02-25 DOA: 11/20/2022 PCP: Glori Luis, MD    Assessment & Plan:   Principal Problem:   Fall at home, initial encounter Active Problems:   Rhabdomyolysis   Acute lower UTI   Type 2 diabetes mellitus with chronic kidney disease   Essential hypertension   Dyslipidemia  Assessment and Plan:  Fall: at home & was associated with collapse with generalized weakness. Echo is pending. Carotid US shows b/l carotid atherosclerosis, but neg for significant stenosis. Neuro consulted as per pt's family request    Rhabdomyolysis: CK is trending up today. Secondary to fall at home. Continue on IVFs    DM2: likely poorly controlled. Repeat HbA1c ordered. Continue on glargine & SSI w/ accuchecks   CKDIIIb: Cr is better than baseline. Avoid nephrotoxic meds    Possible acute lower UTI: urine cx is pending. Continue on IV rocephin    HLD: holding statin secondary to rhabdomyolysis    HTN: continue on home dose of losartan, coreg, amlodipine      DVT prophylaxis: lovenox Code Status: full  Family Communication: discussed pt's care w/ pt's sister, Malachi Bonds, answered her questions Disposition Plan: depends pn PT/OT ercs  Level of care: Telemetry Medical  Status is: Inpatient Remains inpatient appropriate because: severity of illness    Consultants:  Neuro   Procedures:   Antimicrobials:   Subjective: Pt c/o fatigue   Objective: Vitals:   11/21/22 0400 11/21/22 0428 11/21/22 0430 11/21/22 0530  BP: (!) 148/59  (!) 152/64 [name])  162/63  Pulse: 62  60 60  Resp: (!) 22  (!) 23 (!) 21  Temp:  98.3 F (36.8 C)    TempSrc:  Oral    SpO2: 96%  96% 98%  Weight:       No intake or output data in the 24 hours ending 11/21/22 0844 Filed Weights   11/20/22 2335  Weight: 86.8 kg    Examination:  General exam: Appears calm and comfortable  Respiratory system: Clear to auscultation. Respiratory effort normal. Cardiovascular system: S1 & S2 +. No rubs, gallops or  clicks. Gastrointestinal system: Abdomen is nondistended, soft and nontender. Normal bowel sounds heard. Central nervous system: Alert and oriented. Moves all extremities  Psychiatry: Judgement and insight appear normal. Mood & affect appropriate.     Data Reviewed: I have personally reviewed following labs and imaging studies  CBC: Recent Labs  Lab 11/21/22 0032 11/21/22 0529  WBC 11.6* 10.2  NEUTROABS 8.4*  --   HGB 13.7 12.2  HCT 44.8 38.8  MCV 92.2 91.5  PLT 175 172   Basic Metabolic Panel: Recent Labs  Lab 11/20/22 2337 11/21/22 0529  NA 139 140  K 3.5 3.2*  CL 106 109  CO2 22 23  GLUCOSE 92 76  BUN 26* 24*  CREATININE 1.18* 1.06*  CALCIUM 9.4 8.8*  MG 2.0  --    GFR: Estimated Creatinine Clearance: 45.1 mL/min (A) (by C-G formula based on SCr of 1.06 mg/dL (H)). Liver Function Tests: Recent Labs  Lab 11/20/22 2337  AST 39  ALT 16  ALKPHOS 92  BILITOT 1.1  PROT 7.8  ALBUMIN 3.7   No results for input(s): "LIPASE", "AMYLASE" in the last 168 hours. No results for input(s): "AMMONIA" in the last 168 hours. Coagulation Profile: No results for input(s): "INR", "PROTIME" in the last 168 hours. Cardiac Enzymes: Recent Labs  Lab 11/20/22 2337 11/21/22 0529  CKTOTAL 845* 1,332*   BNP (last 3 results) No results for input(s): "PROBNP" in the last 8760 hours. HbA1C: No results for input(s): "HGBA1C" in the last 72 hours. CBG: Recent Labs  Lab 11/21/22 0539 11/21/22 0620 11/21/22 0817  GLUCAP 68* 75 90   Lipid Profile: No results for input(s): "CHOL", "HDL", "LDLCALC", "TRIG", "CHOLHDL", "LDLDIRECT" in the last 72 hours. Thyroid Function Tests: No results for input(s): "TSH", "T4TOTAL", "FREET4", "T3FREE", "THYROIDAB" in the last 72 hours. Anemia Panel: No results for input(s): "VITAMINB12", "FOLATE", "FERRITIN", "TIBC", "IRON", "RETICCTPCT" in the last 72 hours. Sepsis Labs: No results for input(s): "PROCALCITON", "LATICACIDVEN" in the last 168  hours.  No results found for this or any previous visit (from the past 240 hour(s)).       Radiology Studies: DG Chest Portable 1 View  Result Date: 11/21/2022 CLINICAL DATA:  Recent fall EXAM: PORTABLE CHEST 1 VIEW COMPARISON:  04/24/2020 FINDINGS: Cardiac shadow is mildly prominent but accentuated by the portable technique. Aortic calcifications are seen. The lungs are clear bilaterally. Bony abnormality is seen. IMPRESSION: No active disease. Electronically Signed   By: Alcide CleverMark  Lukens M.D.   On: 11/21/2022 00:38   CT Cervical Spine Wo Contrast  Result Date: 11/21/2022 CLINICAL DATA:  Status post fall. EXAM: CT CERVICAL SPINE WITHOUT CONTRAST TECHNIQUE: Multidetector CT imaging of the cervical spine was performed without intravenous contrast. Multiplanar CT image reconstructions were also generated. RADIATION DOSE REDUCTION: This exam was performed according to the departmental dose-optimization program which includes automated exposure control, adjustment of the mA and/or kV according to patient size and/or use  of iterative reconstruction technique. COMPARISON:  None Available. FINDINGS: Alignment: There is straightening of the normal cervical spine lordosis. Skull base and vertebrae: No acute fracture. No primary bone lesion or focal pathologic process. Soft tissues and spinal canal: No prevertebral fluid or swelling. No visible canal hematoma. Disc levels: Marked severity endplate sclerosis, anterior osteophyte formation and posterior bony spurring are seen at the levels of C5-C6, C6-C7 and C7-T1. Moderate severity anterior osteophyte formation is present at the level of C4-C5. There is moderate severity narrowing of the anterior atlantoaxial articulation. Marked severity intervertebral disc space narrowing is seen at the levels of C5-C6, C6-C7 and C7-T1. Bilateral mild-to-moderate severity multilevel facet joint hypertrophy is noted. Upper chest: Negative. Other: The thyroid gland is enlarged and  heterogeneous in appearance. IMPRESSION: 1. No acute fracture or subluxation in the cervical spine. 2. Marked severity multilevel degenerative changes, as described above. 3. Enlarged and heterogeneous thyroid gland. Follow-up with nonemergent thyroid ultrasound is recommended. This follows ACR consensus guidelines: Managing Incidental Thyroid Nodules Detected on Imaging: White Paper of the ACR Incidental Thyroid Findings Committee. J Am Coll Radiol 2015; 12:143-150. Electronically Signed   By: Aram Candela M.D.   On: 11/21/2022 00:12   CT HEAD WO CONTRAST ( )  Result Date: 11/21/2022 CLINICAL DATA:  Status post fall. EXAM: CT HEAD WITHOUT CONTRAST TECHNIQUE: Contiguous axial images were obtained from the base of the skull through the vertex without intravenous contrast. RADIATION DOSE REDUCTION: This exam was performed according to the departmental dose-optimization program which includes automated exposure control, adjustment of the mA and/or kV according to patient size and/or use of iterative reconstruction technique. COMPARISON:  None Available. FINDINGS: Brain: There is mild cerebral atrophy with widening of the extra-axial spaces and ventricular dilatation. There are areas of decreased attenuation within the white matter tracts of the supratentorial brain, consistent with microvascular disease changes. Bilateral basal ganglia calcification is seen. A small chronic right frontoparietal white matter infarct is noted. Vascular: No hyperdense vessel or unexpected calcification. Skull: Normal. Negative for fracture or focal lesion. Sinuses/Orbits: There is moderate severity left maxillary sinus mucosal thickening. Other: None. IMPRESSION: 1. No acute intracranial abnormality. 2. Generalized cerebral atrophy with widening of the extra-axial spaces and ventricular dilatation. 3. Moderate severity left maxillary sinus disease. Electronically Signed   By: Aram Candela M.D.   On: 11/21/2022 00:07         Scheduled Meds:  amLODipine  5 mg Oral Daily   aspirin EC  81 mg Oral Daily   atorvastatin  40 mg Oral QHS   carvedilol  25 mg Oral BID WC   cholecalciferol  1,000 Units Oral Daily   enoxaparin (LOVENOX) injection  0.5 mg/kg Subcutaneous Q24H   furosemide  40 mg Oral Daily   insulin aspart  0-9 Units Subcutaneous Q4H   insulin glargine-yfgn  20 Units Subcutaneous Daily   latanoprost  1 drop Left Eye QHS   losartan  100 mg Oral Daily   omega-3 acid ethyl esters  1 g Oral Daily   Continuous Infusions:  0.9 % NaCl with KCl 20 mEq / L 100 mL/hr at 11/21/22 0610   cefTRIAXone (ROCEPHIN)  IV Stopped (11/21/22 4259)     LOS: 1 day    Time spent: 35 mins     Charise Killian, MD [facility] Pager [phone]  If 7PM-7AM, please contact night-coverage [email or web address] 11/21/2022, 8:44 AM

## 2022-11-21 NOTE — H&P (Addendum)
[place] EKG showed   PATIENT NAME: Vickie Kemp    MR#:  [id number]  DATE OF BIRTH:  10/11/1940  DATE OF ADMISSION:  11/20/2022  PRIMARY CARE PHYSICIAN: Glori Luis, MD   Patient is coming from: Home  REQUESTING/REFERRING PHYSICIAN: Delton Prairie, MD  CHIEF COMPLAINT:   Chief Complaint  Patient presents with   Fall    HISTORY OF PRESENT ILLNESS:  Vickie Kemp is a 82 y.o. African-American female with medical history significant for coronary artery disease, stage IIIb chronic kidney disease, type 2 diabetes mellitus, and OSA, who presented to the ER with acute onset of fall with generalized weakness.  The patient thought her right foot was not communicating with the body and admitted to collapse without loss of consciousness.  She was noted to have urinary incontinence however her daughter stated that this is chronic.  No fever or chills.  No nausea or vomiting or abdominal pain.  No headache or dizziness or blurred vision no unilateral paresthesias or focal muscle weakness.  No tinnitus or vertigo.  No chest pain or palpitations.  No dysuria, oliguria or hematuria or flank pain.  She stayed on the ground from 6 PM till 10:30 PM.  Her sister Vickie Kemp is her power of attorney and was with her in the ER.  ED Course: When she came to the ER, BP was 182/75 with respiratory rate of 22 with otherwise normal vital signs.  Labs revealed  borderline potassium of 3.5 and a BUN 26 creatinine of 1.18, CK of 845 and CBC showed leukocytosis of 11.6.  UA showed more than 500 glucose and trace leukocytes with few bacteria and 6-10 WBCs. EKG as reviewed by me : EKG showed sinus rhythm with a rate of 83 with prolonged PR interval and low voltage QRS Imaging: Portable chest x-ray showed no acute cardiopulmonary disease.  Noncontrast head CT scan revealed no acute intracranial normalities and C-spine CT showed no acute findings.  It showed multi level marked severity degenerative changes  as well as enlarged and heterogenous thyroid gland with follow-up per with nonemergent ultrasound recommended.  The patient was given 1 g of p.o. Tylenol and 1 L bolus of IV lactated Ringer.  She will be admitted to a medical telemetry observation bed for further evaluation and management. PAST MEDICAL HISTORY:   Past Medical History:  Diagnosis Date   Chronic kidney disease    Coronary artery disease    MI, pci to mid LAD 2014. LHC 80%OM, 60%RCA, 90% LAD   Diabetes mellitus without complication (HCC)    Hypertension    Sleep apnea     PAST SURGICAL HISTORY:   Past Surgical History:  Procedure Laterality Date   ACNE CYST REMOVAL  2008   Groin area   CARDIAC CATHETERIZATION     COLONOSCOPY WITH PROPOFOL N/A 05/27/2020   Procedure: COLONOSCOPY WITH PROPOFOL;  Surgeon: Toney Reil, MD;  Location: [facility] ENDOSCOPY;  Service: Gastroenterology;  Laterality: N/A;   CORONARY ANGIOPLASTY      SOCIAL HISTORY:   Social History   Tobacco Use   Smoking status: Never   Smokeless tobacco: Never  Substance Use Topics   Alcohol use: Never    FAMILY HISTORY:   Family History  Problem Relation Age of Onset   Diabetes Mother    Heart disease Father    Diabetes Sister    Diabetes Brother    Diabetes Brother    Breast cancer Neg Hx  DRUG ALLERGIES:   Allergies  Allergen Reactions   Amoxicillin Itching    REVIEW OF SYSTEMS:   ROS As per history of present illness. All pertinent systems were reviewed above. Constitutional, HEENT, cardiovascular, respiratory, GI, GU, musculoskeletal, neuro, psychiatric, endocrine, integumentary and hematologic systems were reviewed and are otherwise negative/unremarkable except for positive findings mentioned above in the HPI.   MEDICATIONS AT HOME:   Prior to Admission medications   Medication Sig Start Date End Date Taking? Authorizing Provider  acetaminophen (TYLENOL) 650 MG CR tablet Take 650 mg by mouth 3 (three) times daily as  needed.    [provider]  amLODipine (NORVASC) 5 MG tablet Take 1 tablet (5 mg total) by mouth daily. 04/20/22   Debbe OdeaAgbor-Etang, Brian, MD  aspirin EC 81 MG tablet Take 81 mg by mouth daily.    [provider]  atorvastatin (LIPITOR) 40 MG tablet Take 1 tablet (40 mg total) by mouth daily. Qhs 08/22/21   Debbe OdeaAgbor-Etang, Brian, MD  carvedilol (COREG) 25 MG tablet TAKE 1 TABLET(25 MG) BY MOUTH TWICE DAILY 10/10/22   Allegra GranaArnett, Margaret G, FNP  Cholecalciferol (VITAMIN D3 PO) Take 1 capsule by mouth daily.    [provider]  Continuous Blood Gluc Sensor (FREESTYLE LIBRE 2 SENSOR) MISC Apply 1 each topically every 14 (fourteen) days. 07/03/22   Eulis FosterWebb, Padonda B, FNP  Empagliflozin-linaGLIPtin (GLYXAMBI) 10-5 MG TABS Take 1 tablet by mouth daily.    [provider]  Empagliflozin-linaGLIPtin (GLYXAMBI) 10-5 MG TABS Take 1 tablet by mouth daily. 05/16/22     furosemide (LASIX) 40 MG tablet Take 1 tablet (40 mg total) by mouth in the morning and at bedtime. Patient taking differently: Take 40 mg by mouth daily. 01/24/21   Debbe OdeaAgbor-Etang, Brian, MD  insulin degludec (TRESIBA FLEXTOUCH) 200 UNIT/ML FlexTouch Pen Inject 32 Units into the skin daily. D/c 56 units 04/10/22   McLean-Scocuzza, Pasty Spillersracy N, MD  insulin degludec (TRESIBA) 200 UNIT/ML FlexTouch Pen Inject 32 units into the skin daily 05/16/22     insulin lispro (HUMALOG KWIKPEN) 100 UNIT/ML KwikPen Inject 8 Units into the skin 3 (three) times daily. Patient taking differently: Inject 8 Units into the skin daily as needed (glucose greater than 300). 07/29/21   McLean-Scocuzza, Pasty Spillersracy N, MD  Insulin Pen Needle (NOVOFINE PEN NEEDLE) 32G X 6 MM MISC Use one pen needle with Tresiba injection 05/16/22     Insulin Pen Needle 32G X 4 MM MISC 1 Device by Does not apply route 4 (four) times daily - after meals and at bedtime. 05/01/22   McLean-Scocuzza, Pasty Spillersracy N, MD  latanoprost (XALATAN) 0.005 % ophthalmic solution Place 1 drop into the left eye at  bedtime.    [provider]  losartan (COZAAR) 100 MG tablet Take 100 mg by mouth daily.    [provider]  Omega-3 Fatty Acids (FISH OIL) 1000 MG CAPS Take 1,000 mg by mouth daily.    [provider]  [facility] Denton[facility]CH ULTRA test strip USE TO CHECK BLOOD SUGAR THREE TIMES DAILY 05/31/21   McLean-Scocuzza, Pasty Spillersracy N, MD  Turmeric (QC TUMERIC COMPLEX PO) Take 1 capsule by mouth daily.    [provider]      VITAL SIGNS:  Blood pressure (!) 162/63, pulse 60, temperature 98.3 F (36.8 C), temperature source Oral, resp. rate (!) 21, weight 86.8 kg, SpO2 98 %.  PHYSICAL EXAMINATION:  Physical Exam  GENERAL:  82 y.o.-year-old patient lying in the bed with no acute distress.  EYES: Pupils equal,  round, reactive to light and accommodation. No scleral icterus. Extraocular muscles intact.  HEENT: Head atraumatic, normocephalic. Oropharynx and nasopharynx clear.  NECK:  Supple, no jugular venous distention. No thyroid enlargement, no tenderness.  LUNGS: Normal breath sounds bilaterally, no wheezing, rales,rhonchi or crepitation. No use of accessory muscles of respiration.  CARDIOVASCULAR: Regular rate and rhythm, S1, S2 normal. No murmurs, rubs, or gallops.  ABDOMEN: Soft, nondistended, nontender. Bowel sounds present. No organomegaly or mass.  EXTREMITIES: No pedal edema, cyanosis, or clubbing.  NEUROLOGIC: Cranial nerves II through XII are intact. Muscle strength 5/5 in all extremities. Sensation intact. Gait not checked.  PSYCHIATRIC: The patient is alert and oriented x 3.  Normal affect and good eye contact. SKIN: Right minor anterior leg clean ulcers.  LABORATORY PANEL:   CBC Recent Labs  Lab 11/21/22 0529  WBC 10.2  HGB 12.2  HCT 38.8  PLT 172   ------------------------------------------------------------------------------------------------------------------  Chemistries  Recent Labs  Lab 11/20/22 2337  NA 139  K 3.5  CL 106  CO2 22  GLUCOSE 92   BUN 26*  CREATININE 1.18*  CALCIUM 9.4  MG 2.0  AST 39  ALT 16  ALKPHOS 92  BILITOT 1.1   ------------------------------------------------------------------------------------------------------------------  Cardiac Enzymes No results for input(s): "TROPONINI" in the last 168 hours. ------------------------------------------------------------------------------------------------------------------  RADIOLOGY:  DG Chest Portable 1 View  Result Date: 11/21/2022 CLINICAL DATA:  Recent fall EXAM: PORTABLE CHEST 1 VIEW COMPARISON:  04/24/2020 FINDINGS: Cardiac shadow is mildly prominent but accentuated by the portable technique. Aortic calcifications are seen. The lungs are clear bilaterally. Bony abnormality is seen. IMPRESSION: No active disease. Electronically Signed   By: Alcide Clever M.D.   On: 11/21/2022 00:38   CT Cervical Spine Wo Contrast  Result Date: 11/21/2022 CLINICAL DATA:  Status post fall. EXAM: CT CERVICAL SPINE WITHOUT CONTRAST TECHNIQUE: Multidetector CT imaging of the cervical spine was performed without intravenous contrast. Multiplanar CT image reconstructions were also generated. RADIATION DOSE REDUCTION: This exam was performed according to the departmental dose-optimization program which includes automated exposure control, adjustment of the mA and/or kV according to patient size and/or use of iterative reconstruction technique. COMPARISON:  None Available. FINDINGS: Alignment: There is straightening of the normal cervical spine lordosis. Skull base and vertebrae: No acute fracture. No primary bone lesion or focal pathologic process. Soft tissues and spinal canal: No prevertebral fluid or swelling. No visible canal hematoma. Disc levels: Marked severity endplate sclerosis, anterior osteophyte formation and posterior bony spurring are seen at the levels of C5-C6, C6-C7 and C7-T1. Moderate severity anterior osteophyte formation is present at the level of C4-C5. There is moderate  severity narrowing of the anterior atlantoaxial articulation. Marked severity intervertebral disc space narrowing is seen at the levels of C5-C6, C6-C7 and C7-T1. Bilateral mild-to-moderate severity multilevel facet joint hypertrophy is noted. Upper chest: Negative. Other: The thyroid gland is enlarged and heterogeneous in appearance. IMPRESSION: 1. No acute fracture or subluxation in the cervical spine. 2. Marked severity multilevel degenerative changes, as described above. 3. Enlarged and heterogeneous thyroid gland. Follow-up with nonemergent thyroid ultrasound is recommended. This follows ACR consensus guidelines: Managing Incidental Thyroid Nodules Detected on Imaging: White Paper of the ACR Incidental Thyroid Findings Committee. J Am Coll Radiol 2015; 12:143-150. Electronically Signed   By: Aram Candela M.D.   On: 11/21/2022 00:12   CT HEAD WO CONTRAST ( )  Result Date: 11/21/2022 CLINICAL DATA:  Status post fall. EXAM: CT HEAD WITHOUT CONTRAST TECHNIQUE: Contiguous axial images were obtained from the  base of the skull through the vertex without intravenous contrast. RADIATION DOSE REDUCTION: This exam was performed according to the departmental dose-optimization program which includes automated exposure control, adjustment of the mA and/or kV according to patient size and/or use of iterative reconstruction technique. COMPARISON:  None Available. FINDINGS: Brain: There is mild cerebral atrophy with widening of the extra-axial spaces and ventricular dilatation. There are areas of decreased attenuation within the white matter tracts of the supratentorial brain, consistent with microvascular disease changes. Bilateral basal ganglia calcification is seen. A small chronic right frontoparietal white matter infarct is noted. Vascular: No hyperdense vessel or unexpected calcification. Skull: Normal. Negative for fracture or focal lesion. Sinuses/Orbits: There is moderate severity left maxillary sinus mucosal  thickening. Other: None. IMPRESSION: 1. No acute intracranial abnormality. 2. Generalized cerebral atrophy with widening of the extra-axial spaces and ventricular dilatation. 3. Moderate severity left maxillary sinus disease. Electronically Signed   By: Aram Candela M.D.   On: 11/21/2022 00:07      IMPRESSION AND PLAN:  Assessment and Plan: * Fall at home, initial encounter - This was associated with collapse with generalized weakness. - The patient be admitted to an observation medical telemetry bed. - PT eval will be obtained. - Pain management will be provided. - Will obtain a 2D echo and bilateral carotid Doppler for further assessment. - This is likely the culprit for rhabdomyolysis. - We will obtain a neurology consultation per family request. - I notified Dr. Iver Nestle about the patient.  Rhabdomyolysis She will be placed on hydration with IV normal saline and will follow her CK levels.  Type 2 diabetes mellitus with chronic kidney disease - The patient will be placed on supplement coverage with NovoLog. - We will continue basal coverage.  Acute lower UTI - This is suspected from her UA and will need to be confirmed with urine culture and sensitivity. - We will place her for now on IV Rocephin and follow urine culture.  Dyslipidemia - We will continue her statin therapy as well as Lovaza.  Essential hypertension - We will continue her antihypertensive therapy.   DVT prophylaxis: Lovenox.  Advanced Care Planning:  Code Status: full code.  Family Communication:  The plan of care was discussed in details with the patient (and family). I answered all questions. The patient agreed to proceed with the above mentioned plan. Further management will depend upon hospital course. Disposition Plan: Back to previous home environment Consults called: Neurology All the records are reviewed and case discussed with ED provider.  Status is: Inpatient  At the time of the admission,  it appears that the appropriate admission status for this patient is inpatient.  This is judged to be reasonable and necessary in order to provide the required intensity of service to ensure the patient's safety given the presenting symptoms, physical exam findings and initial radiographic and laboratory data in the context of comorbid conditions.  The patient requires inpatient status due to high intensity of service, high risk of further deterioration and high frequency of surveillance required.  I certify that at the time of admission, it is my clinical judgment that the patient will require inpatient hospital care extending more than 2 midnights.                            Dispo: The patient is from: Home              Anticipated d/c is to: Home  Patient currently is not medically stable to d/c.              Difficult to place patient: No  Hannah Beat M.D on 11/21/2022 at 6:19 AM  [facility]   From 7 PM-7 AM, contact night-coverage [email or web address]  CC: Primary care physician; Glori Luis, MD

## 2022-11-21 NOTE — Evaluation (Signed)
Physical Therapy Evaluation Patient Details Name: Vickie Kemp MRN: [id number] DOB: 1941/04/13 Today's Date: 11/21/2022  History of Present Illness  Vickie Kemp is a 82 y.o. African-American female with medical history significant for coronary artery disease, stage IIIb chronic kidney disease, type 2 diabetes mellitus, and OSA, who presented to the ER with acute onset of fall with generalized weakness.    Clinical Impression  Pt received in supine position upon arrival to the room with Neurologist MD in room.  Pt requires min-modA +2 for  bed mobility and use of hand hold to come upright into sitting.  Pt very slow and deliberate with mobility and requires min-modA +2 to standing.  Pt needs cuing for upright posture and tucking in with glutes.  Pt then transferred with CGA to the commode where she remained for several minutes.  Pt requested for privacy and nursing notified to check back in with her.  Sister in room stating she would call when pt was finished.       Recommendations for follow up therapy are one component of a multi-disciplinary discharge planning process, led by the attending physician.  Recommendations may be updated based on patient status, additional functional criteria and insurance authorization.     Assistance Recommended at Discharge PRN  Patient can return home with the following  A little help with walking and/or transfers;A little help with bathing/dressing/bathroom    Equipment Recommendations Rolling walker (2 wheels)  Recommendations for Other Services       Functional Status Assessment Patient has had a recent decline in their functional status and demonstrates the ability to make significant improvements in function in a reasonable and predictable amount of time.     Precautions / Restrictions Precautions Precautions: Fall      Mobility  Bed Mobility Overal bed mobility: Needs Assistance Bed Mobility: Supine to Sit     Supine to sit: Mod  assist     General bed mobility comments: UE support to get EOB.  Pt very slow regarding movements.    Transfers Overall transfer level: Needs assistance Equipment used: Rolling walker (2 wheels) Transfers: Sit to/from Stand Sit to Stand: +2 physical assistance, Min assist, Mod assist           General transfer comment: min-modA +2 from therapist and sister assisted pt in standing by pushing her forward when coming upright.    Ambulation/Gait Ambulation/Gait assistance: Min guard Gait Distance (Feet): 8 Feet Assistive device: Rolling walker (2 wheels) Gait Pattern/deviations: Step-through pattern, Decreased stride length, Trunk flexed Gait velocity: decreased     General Gait Details: pt with very slow movement ambulating and requiring verbal and tactile feedback for upright posture.  Stairs            Wheelchair Mobility    Modified Rankin (Stroke Patients Only)       Balance Overall balance assessment: Needs assistance Sitting-balance support: Single extremity supported, Feet unsupported Sitting balance-Leahy Scale: Fair     Standing balance support: Bilateral upper extremity supported, During functional activity Standing balance-Leahy Scale: Fair                               Pertinent Vitals/Pain Pain Assessment Pain Assessment: Faces Faces Pain Scale: Hurts little more Pain Location: R Knee Pain Descriptors / Indicators: Aching, Sore Pain Intervention(s): Limited activity within patient's tolerance, Monitored during session    Home Living Family/patient expects to be discharged to:: Private residence Living Arrangements:  Alone Available Help at Discharge: Family;Available PRN/intermittently Type of Home: Apartment Home Access: Level entry       Home Layout: One level Home Equipment: Rollator (4 wheels);Toilet riser;Grab bars - toilet;Grab bars - tub/shower;Hand held shower head      Prior Function Prior Level of Function :  Independent/Modified Independent                     Hand Dominance   Dominant Hand: Right    Extremity/Trunk Assessment   Upper Extremity Assessment Upper Extremity Assessment: Defer to OT evaluation    Lower Extremity Assessment Lower Extremity Assessment: Generalized weakness       Communication   Communication: No difficulties  Cognition Arousal/Alertness: Lethargic Behavior During Therapy: WFL for tasks assessed/performed Overall Cognitive Status: Within Functional Limits for tasks assessed                                          General Comments      Exercises     Assessment/Plan    PT Assessment Patient needs continued PT services  PT Problem List Decreased strength;Decreased range of motion;Decreased activity tolerance;Decreased balance;Decreased mobility;Decreased knowledge of use of DME;Decreased safety awareness;Obesity       PT Treatment Interventions DME instruction;Gait training;Functional mobility training;Therapeutic activities;Therapeutic exercise;Balance training;Neuromuscular re-education;Patient/family education    PT Goals (Current goals can be found in the Care Plan section)  Acute Rehab PT Goals Patient Stated Goal: to get stronger. PT Goal Formulation: With patient Time For Goal Achievement: 12/05/22 Potential to Achieve Goals: Good    Frequency Min 3X/week     Co-evaluation               AM-PAC PT "6 Clicks" Mobility  Outcome Measure Help needed turning from your back to your side while in a flat bed without using bedrails?: A Little Help needed moving from lying on your back to sitting on the side of a flat bed without using bedrails?: A Little Help needed moving to and from a bed to a chair (including a wheelchair)?: A Little Help needed standing up from a chair using your arms (e.g., wheelchair or bedside chair)?: A Little Help needed to walk in hospital room?: A Little Help needed climbing 3-5  steps with a railing? : A Lot 6 Click Score: 17    End of Session   Activity Tolerance: Patient tolerated treatment well Patient left: Other (comment) (on toilet per request with the nursing staff notified.) Nurse Communication: Mobility status;Other (comment) (Where pt was left upon leaving the room.) PT Visit Diagnosis: Unsteadiness on feet (R26.81);Other abnormalities of gait and mobility (R26.89);Repeated falls (R29.6);Muscle weakness (generalized) (M62.81);History of falling (Z91.81);Difficulty in walking, not elsewhere classified (R26.2);Adult, failure to thrive (R62.7);Pain Pain - Right/Left: Right Pain - part of body: Knee    Time: 4917-9150 PT Time Calculation (min) (ACUTE ONLY): 38 min   Charges:   PT Evaluation $PT Eval Low Complexity: 1 Low PT Treatments $Therapeutic Activity: 8-22 mins        Nolon Bussing, PT, DPT Physical Therapist - [facility]  [facility]  11/21/22, 5:08 PM

## 2022-11-21 NOTE — Assessment & Plan Note (Signed)
-   We will continue her antihypertensive therapy. 

## 2022-11-21 NOTE — Consult Note (Signed)
Neurology Consultation Reason for Consult: Dizziness and fall  Requesting Physician: Fabienne BrunsJamiese Williams  CC: Increasing falls   History is obtained from: Patient, chart review, sister at bedside  HPI: Vickie Kemp is a 82 y.o. female with a PMHx significant for hypertension, diabetes, coronary artery disease s/p PCI (LAD 2014), sleep apnea not on CPAP due to mobility issues, lymphedema, venous insufficiency, chronic neck pain, left eye blindness secondary to retinal detachment.  She reports she has been in her usual state of health she had ambulated to the bathroom with her rollator but then found that she could not move her foot well.  When she tried to move her other foot that was also weak and then she noted her arms were weak as well when she tried to support herself on the door frame.  Subsequently she fell and was unable to get up and out of reach of her phone for approximately 4.5 hours.  On review of systems she is also been having some mild right-sided headaches for the past 2 weeks lasting up to all day generally improving with sleep, 5/10 intensity, not improving with Tylenol.  She feels that his she has been really leaning more to the right side for a few weeks but then notes she has always had some postural issues for many years.  In particular she feels that she is walking with a very stooped posture and this resulted in craning her neck which leads to neck pain.  She has a great deal of stiffness at night especially and due to her very slow movements she does not use CPAP as it is too challenging for her to manage it.  She does feel that her handwriting has become softer and smaller over the past few years gradually progressively.  She has also had a tremor initially only in the left upper extremity but now also noted in the right upper extremity at times.  She has been having urinary incontinence for at least the past 9 months with increasing frequency of needing to void (voiding 10-12  times a day now whereas she voided 3-4 times a day previously).  She also notes she had some stool incontinence but this has actually been improving.  She was recently started on tramadol on Monday secondary to pain   ROS: All other review of systems was negative except as noted in the HPI.   Past Medical History:  Diagnosis Date   Chronic kidney disease    Coronary artery disease    MI, pci to mid LAD 2014. LHC 80%OM, 60%RCA, 90% LAD   Diabetes mellitus without complication (HCC)    Hypertension    Sleep apnea    Past Surgical History:  Procedure Laterality Date   ACNE CYST REMOVAL  2008   Groin area   CARDIAC CATHETERIZATION     COLONOSCOPY WITH PROPOFOL N/A 05/27/2020   Procedure: COLONOSCOPY WITH PROPOFOL;  Surgeon: Toney ReilVanga, Rohini Reddy, MD;  [facility] END[facility]COPY;  Service: Gastroenterology;  Laterality: N/A;   CORONARY ANGIOPLASTY     Current Outpatient Medications  Medication Instructions   acetaminophen (TYLENOL) 650 mg, Oral, 3 times daily PRN   amLODipine (NORVASC) 5 mg, Oral, Daily   aspirin EC 81 mg, Oral, Daily   atorvastatin (LIPITOR) 40 mg, Oral, Daily, Qhs   carvedilol (COREG) 25 MG tablet TAKE 1 TABLET(25 MG) BY MOUTH TWICE DAILY   cetirizine (ZYRTEC) 10 mg, Oral, Daily   Cholecalciferol (VITAMIN D3 PO) 1 capsule, Oral, Daily   Continuous Blood  Gluc Sensor (FREESTYLE LIBRE 2 SENSOR) MISC 1 each, Topical, Every 14 days   Empagliflozin-linaGLIPtin (GLYXAMBI) 10-5 MG TABS 1 tablet, Oral, Daily   Empagliflozin-linaGLIPtin (GLYXAMBI) 10-5 MG TABS 1 tablet, Oral, Daily   Fish Oil 1,000 mg, Oral, Daily   furosemide (LASIX) 40 mg, Oral, 2 times daily   insulin degludec (TRESIBA) 200 UNIT/ML FlexTouch Pen Inject 32 units into the skin daily   insulin lispro (HUMALOG KWIKPEN) 8 Units, Subcutaneous, 3 times daily   Insulin Pen Needle (NOVOFINE PEN NEEDLE) 32G X 6 MM MISC Use one pen needle with Evaristo Bury injection   Insulin Pen Needle 32G X 4 MM MISC 1 Device, Does not  apply, 3 times daily after meals & bedtime   latanoprost (XALATAN) 0.005 % ophthalmic solution 1 drop, Left Eye, Daily at bedtime   losartan (COZAAR) 100 mg, Oral, Daily   MIEBO 1.338 GM/ML SOLN 1 drop, 4 times daily   ONETOUCH ULTRA test strip USE TO CHECK BLOOD SUGAR THREE TIMES DAILY   traMADol (ULTRAM) 50 mg, Oral, As needed   Turmeric (QC TUMERIC COMPLEX PO) 1 capsule, Oral, Daily    Family History  Problem Relation Age of Onset   Diabetes Mother    Heart disease Father    Diabetes Sister    Diabetes Brother    Diabetes Brother    Breast cancer Neg Hx     Social History:  reports that she has never smoked. She has never used smokeless tobacco. She reports that she does not drink alcohol and does not use drugs.   Exam: Current vital signs: BP (!) 162/63   Pulse 60   Temp 98.3 F (36.8 C) (Oral)   Resp (!) 21   Wt 86.8 kg   LMP  (LMP Unknown)   SpO2 98%   BMI 32.84 kg/m  Vital signs in last 24 hours: Temp:  [98.1 F (36.7 C)-98.3 F (36.8 C)] 98.3 F (36.8 C) (04/09 0428) Pulse Rate:  [60-83] 60 (04/09 0530) Resp:  [17-23] 21 (04/09 0530) BP: (143-182)/(59-75) 162/63 (04/09 0530) SpO2:  [95 %-99 %] 98 % (04/09 0530) Weight:  [86.8 kg] 86.8 kg (04/08 2335)   Physical Exam  Constitutional: Appears well-developed and well-nourished.  Psych: Affect mildly anxious but cooperative Eyes: No scleral injection HENT: No oropharyngeal obstruction.  MSK: no major joint deformities.  Cardiovascular: Perfusing extremities well Respiratory: Effort normal, non-labored breathing GI: Soft.  No distension. There is no tenderness.  Skin: Chronic skin changes of lymphedema in the bilateral lower extremities  Neuro: Mental Status: Patient is awake, alert, oriented to person, place, month, year, and situation. Patient is able to give a clear and coherent history No signs of aphasia or neglect Cranial Nerves: II: Visual Fields are full. Pupils are equal, round, and reactive  to light.   III,IV, VI: EOMI without ptosis or diploplia.  V: Facial sensation is symmetric to temperature VII: Facial movement is symmetric.  VIII: hearing is intact to voice X: Uvula elevates symmetrically XI: Shoulder shrug is symmetric. XII: tongue is midline without atrophy or fasciculations.  Motor: Tone is normal. Bulk is normal. 5/5 strength was present in all four extremities, except for extremely pain limited right lower extremity hip flexion (2/5) Sensory: Sensation is symmetric to light touch and temperature in the arms and legs. Deep Tendon Reflexes: At times challenging to obtain due to her difficulty relaxing.  However she is 2+ in the right biceps, 1+ left biceps, 2+ right patellar but brisker 2+ left patellar.  Unable to elicit brachioradialis reflexes Cerebellar: FNF and HKS are intact bilaterally within limits of pain/effort Gait:  Extended time spent in great preparation.  She is able to manipulate her walker well and uses extremities equally.  Not clearly shuffling gait but very cautious   I have reviewed labs in epic and the results pertinent to this consultation are:  Basic Metabolic Panel: Recent Labs  Lab 11/20/22 2337 11/21/22 0529  NA 139 140  K 3.5 3.2*  CL 106 109  CO2 22 23  GLUCOSE 92 76  BUN 26* 24*  CREATININE 1.18* 1.06*  CALCIUM 9.4 8.8*  MG 2.0  --     CBC: Recent Labs  Lab 11/21/22 0032 11/21/22 0529  WBC 11.6* 10.2  NEUTROABS 8.4*  --   HGB 13.7 12.2  HCT 44.8 38.8  MCV 92.2 91.5  PLT 175 172    Coagulation Studies: No results for input(s): "LABPROT", "INR" in the last 72 hours.   Lab Results  Component Value Date   HGBA1C 7.7 12/09/2021  Repeat pending  Lab Results  Component Value Date   CHOL 114 11/21/2022   HDL 31 (L) 11/21/2022   LDLCALC 77 11/21/2022   LDLDIRECT 49.0 06/07/2021   TRIG 32 11/21/2022   CHOLHDL 3.7 11/21/2022   Lab Results  Component Value Date   TSH 1.59 09/27/2021   CK 845 ->  1332  PoC glucose 76 this morning   I have reviewed the images obtained:  MRA head w/o and MRI brain w/o 11/21/22 personally reviewed, agree with radiology:   1. No evidence of acute intracranial abnormality [but she does have chronic microvascular disease and at least 1 prior stroke in the right hemisphere, frontal lobe with encephalomalacia) 2. No large vessel occlusion. 3. Small/hypoplastic left A1 ACA with probably moderate stenosis.     CT head 11/21/2022 1. No acute intracranial abnormality. 2. Generalized cerebral atrophy with widening of the extra-axial spaces and ventricular dilatation. 3. Moderate severity left maxillary sinus disease  CT C-spine 11/21/2022 1. No acute fracture or subluxation in the cervical spine. 2. Marked severity multilevel degenerative changes, as described above. 3. Enlarged and heterogeneous thyroid gland. Follow-up with nonemergent thyroid ultrasound is recommended. This follows ACR consensus guidelines: Managing Incidental Thyroid Nodules Detected on Imaging: White Paper of the ACR Incidental Thyroid Findings Committee. J Am Coll Radiol 2015; 12:143-150.  CTA neck 10/31/2022 1. Patent cervical carotid and vertebral arteries with mild atherosclerosis and no significant stenosis. 2. 1.8 cm incidental thyroid nodule with heterogeneous and enlarged thyroid. Recommend non-emergent thyroid ultrasound. Reference: J Am Coll Radiol. 2015 Feb;12(2): 143-50 3.  Aortic Atherosclerosis (ICD10-I70.0).  MRI C-spine 10/12/2022 1. Multilevel degenerative changes of the cervical spine with moderate spinal canal stenosis at C5-C6, C6-C7, and C7-T1. 2. Moderate right neural foraminal narrowing at C5-C6, C6-C7, and C7-T1 3. Absent flow void in the V1 and proximal V2 segment of the right vertebral artery.This could be artifactual, but could also be seen in the setting of high grade stenosis or occlusion. Recommend further evaluation with CTA neck.   US thyroid  11/13/2022 1. Findings suggestive of multinodular goiter. 2. Nodules labeled #1, #3 and #4 all meet imaging criteria to recommend percutaneous sampling. Current imaging recommendations are to only biopsy the two most worrisome nodules at a given time. As such, nodules labeled #1 and #3 could undergo ultrasound-guided fine-needle aspiration as indicated. (Note, nodule labeled #1 correlates with the nodule seen on preceding neck CTA).  Impression: Most likely patient is  experiencing multifactorial gait disorder secondary to known degenerative disc disease, with 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  Her CK elevation is perhaps out of proportion to the amount of time she was down, and rheumatological evaluation can be considered if she is not improving.  Certainly her examination is a very pain/effort dependent.  Some elements of her history are suggestive of potential parkinsonism, which I will evaluate further 4/10; unfortunately my time with the patient was abbreviated secondary to an emergent code stroke evaluation for another patient  Recommendations:  # Dizziness, gait impairment - MRI brain w/o contrast ordered and reviewed as above  - MRA head w/o contrast  -Initial workup of neuropathy to include A1c, B12, TSH, ANA, ESR, SPEP, UPEP, IFE, HIV,  -Myelopathy labs: zinc, copper, B12, MMA, folate, Vitamin E -Encouraged patient to use CPAP  #Thyroid nodule  -ENT outpatient referral pending  Neurology will follow along   Brooke Dare MD-PhD [facility] [phone] [facility] coverage for [facility] ([place]) is from 8 AM to 4 AM in-house and 4 PM to 8 PM by telephone/video. 8 PM to 8 AM emergent questions or overnight urgent questions should be addressed to Teleneurology On-call or [facility] neurohospitalist; contact information can be found on AMION

## 2022-11-21 NOTE — Inpatient Diabetes Management (Signed)
Inpatient Diabetes Program Recommendations  AACE/ADA: New Consensus Statement on Inpatient Glycemic Control   Target Ranges:  Prepandial:   less than 140 mg/dL      Peak postprandial:   less than 180 mg/dL (1-2 hours)      Critically ill patients:  140 - 180 mg/dL    Latest Reference Range & Units 11/21/22 05:39 11/21/22 06:20  Glucose-Capillary 70 - 99 mg/dL 68 (L) 75    Latest Reference Range & Units 11/20/22 23:37 11/21/22 05:29  Glucose 70 - 99 mg/dL 92 76   Review of Glycemic Control  Diabetes history: DM2 Outpatient Diabetes medications: Tresiba 32 units daily, Humalog 8 units as needed if CBG >300 mg/dl, Glyxambi 09-4 mg daily Current orders for Inpatient glycemic control: Semglee 32 units daily, Novolog 0-9 units Q4H, Tradjenta 5 mg daily, Jardiance 10 mg daily  Inpatient Diabetes Program Recommendations:    Insulin: CBG 68 mg/dl today at 7:09 am. Please consider decreasing Semglee to 20 units daily.  Thanks, Orlando Penner, RN, MSN, CDCES Diabetes Coordinator Inpatient Diabetes Program [phone] (Team Pager from 8am to 5pm)

## 2022-11-21 NOTE — Assessment & Plan Note (Signed)
She will be placed on hydration with IV normal saline and will follow her CK levels.

## 2022-11-21 NOTE — Progress Notes (Signed)
*  PRELIMINARY RESULTS* Echocardiogram 2D Echocardiogram has been performed.  Vickie Kemp 11/21/2022, 9:23 AM

## 2022-11-21 NOTE — Assessment & Plan Note (Signed)
-   We will continue her statin therapy as well as Lovaza.

## 2022-11-22 DIAGNOSIS — R262 Difficulty in walking, not elsewhere classified: Secondary | ICD-10-CM

## 2022-11-22 DIAGNOSIS — E041 Nontoxic single thyroid nodule: Secondary | ICD-10-CM | POA: Insufficient documentation

## 2022-11-22 LAB — BASIC METABOLIC PANEL
Anion gap: 6 (ref 5–15)
BUN: 22 mg/dL (ref 8–23)
CO2: 24 mmol/L (ref 22–32)
Calcium: 8.8 mg/dL — ABNORMAL LOW (ref 8.9–10.3)
Chloride: 111 mmol/L (ref 98–111)
Creatinine, Ser: 1.26 mg/dL — ABNORMAL HIGH (ref 0.44–1.00)
GFR, Estimated: 43 mL/min — ABNORMAL LOW (ref >60)
Glucose, Bld: 108 mg/dL — ABNORMAL HIGH (ref 70–99)
Potassium: 4.3 mmol/L (ref 3.5–5.1)
Sodium: 141 mmol/L (ref 135–145)

## 2022-11-22 LAB — GLUCOSE, CAPILLARY
Glucose-Capillary: 102 mg/dL — ABNORMAL HIGH (ref 70–99)
Glucose-Capillary: 116 mg/dL — ABNORMAL HIGH (ref 70–99)
Glucose-Capillary: 119 mg/dL — ABNORMAL HIGH (ref 70–99)
Glucose-Capillary: 173 mg/dL — ABNORMAL HIGH (ref 70–99)
Glucose-Capillary: 228 mg/dL — ABNORMAL HIGH (ref 70–99)

## 2022-11-22 LAB — URINE CULTURE

## 2022-11-22 LAB — CBC
HCT: 37.8 % (ref 36.0–46.0)
Hemoglobin: 12 g/dL (ref 12.0–15.0)
MCH: 28.4 pg (ref 26.0–34.0)
MCHC: 31.7 g/dL (ref 30.0–36.0)
MCV: 89.6 fL (ref 80.0–100.0)
Platelets: 179 10*3/uL (ref 150–400)
RBC: 4.22 MIL/uL (ref 3.87–5.11)
RDW: 14.7 % (ref 11.5–15.5)
WBC: 10.6 10*3/uL — ABNORMAL HIGH (ref 4.0–10.5)
nRBC: 0 % (ref 0.0–0.2)

## 2022-11-22 LAB — VITAMIN B12: Vitamin B-12: 383 pg/mL (ref 180–914)

## 2022-11-22 LAB — HEMOGLOBIN A1C
Hgb A1c MFr Bld: 7.2 % — ABNORMAL HIGH (ref 4.8–5.6)
Mean Plasma Glucose: 160 mg/dL

## 2022-11-22 LAB — C-REACTIVE PROTEIN: CRP: 3.4 mg/dL — ABNORMAL HIGH (ref <1.0)

## 2022-11-22 LAB — CK: Total CK: 1071 U/L — ABNORMAL HIGH (ref 38–234)

## 2022-11-22 MED ORDER — VITAMIN B-12 1000 MCG PO TABS
1000.0000 ug | ORAL_TABLET | Freq: Every day | ORAL | Status: DC
  Administered 2022-11-22: 1000 ug via ORAL
  Filled 2022-11-22: qty 1

## 2022-11-22 MED ORDER — CYANOCOBALAMIN 1000 MCG PO TABS: 1000.0000 ug | ORAL_TABLET | Freq: Every day | ORAL | 0 refills | Status: DC

## 2022-11-22 NOTE — Evaluation (Signed)
Occupational Therapy Evaluation Patient Details Name: Vickie Kemp MRN: [id number] DOB: Apr 26, 1941 Today's Date: 11/22/2022   History of Present Illness Koreen Guebara is a 82 y.o. African-American female with medical history significant for coronary artery disease, stage IIIb chronic kidney disease, type 2 diabetes mellitus, and OSA, who presented to the ER with acute onset of fall with generalized weakness.   Clinical Impression   Ms Sendelbach was seen for OT evaluation this date. Prior to hospital admission, pt was MOD I using rollator. Pt lives alone in apartment. Pt presents to acute OT demonstrating impaired ADL performance and functional mobility 2/2 decreased activity tolerance and functional strength/ROM deficits. Pt currently requires MIN A + RW standing from chair height, improves to CGA + RW standing from BSC height. Tolerates ~15 ft mobility prior to requiring seated rest break. CGA pericare standing. MOD A don B socks and underwear, assist for threading over feet and pulling up over rear. Pt would benefit from skilled OT to address noted impairments and functional limitations (see below for any additional details). Upon hospital discharge, recommend follow up therapy and increased assistance for ADLs.   Recommendations for follow up therapy are one component of a multi-disciplinary discharge planning process, led by the attending physician.  Recommendations may be updated based on patient status, additional functional criteria and insurance authorization.   Assistance Recommended at Discharge Intermittent Supervision/Assistance  Patient can return home with the following A little help with bathing/dressing/bathroom;Help with stairs or ramp for entrance;A little help with walking and/or transfers    Functional Status Assessment  Patient has had a recent decline in their functional status and demonstrates the ability to make significant improvements in function in a reasonable and  predictable amount of time.  Equipment Recommendations  BSC/3in1    Recommendations for Other Services       Precautions / Restrictions Precautions Precautions: Fall Restrictions Weight Bearing Restrictions: No      Mobility Bed Mobility               General bed mobility comments: not tested    Transfers Overall transfer level: Needs assistance Equipment used: Rolling walker (2 wheels) Transfers: Sit to/from Stand Sit to Stand: Min assist                  Balance Overall balance assessment: Needs assistance Sitting-balance support: No upper extremity supported, Feet supported Sitting balance-Leahy Scale: Fair     Standing balance support: Single extremity supported, During functional activity Standing balance-Leahy Scale: Fair                             ADL either performed or assessed with clinical judgement   ADL Overall ADL's : Needs assistance/impaired                                       General ADL Comments: MIN A + RW standing from chair height, improves to CGA + RW standing from BSC height. CGA pericare standing. MOD A don B socks and underwear, assist for threading over feet and pulling up over rear.      Pertinent Vitals/Pain Pain Assessment Pain Assessment: Faces Faces Pain Scale: Hurts a little bit Pain Location: neck Pain Descriptors / Indicators: Aching, Sore Pain Intervention(s): Limited activity within patient's tolerance, Repositioned, Heat applied     Hand Dominance Right   Extremity/Trunk  Assessment Upper Extremity Assessment Upper Extremity Assessment: Generalized weakness   Lower Extremity Assessment Lower Extremity Assessment: Generalized weakness       Communication Communication Communication: No difficulties   Cognition Arousal/Alertness: Awake/alert Behavior During Therapy: WFL for tasks assessed/performed Overall Cognitive Status: Within Functional Limits for tasks assessed                                                   Home Living Family/patient expects to be discharged to:: Private residence Living Arrangements: Alone Available Help at Discharge: Family;Available PRN/intermittently Type of Home: Apartment Home Access: Level entry     Home Layout: One level     Bathroom Shower/Tub: Tub/shower unit         Home Equipment: Rollator (4 wheels);Toilet riser;Grab bars - toilet;Grab bars - tub/shower;Hand held shower head          Prior Functioning/Environment Prior Level of Function : Independent/Modified Independent                        OT Problem List: Decreased strength;Decreased range of motion;Decreased activity tolerance;Impaired balance (sitting and/or standing);Decreased safety awareness      OT Treatment/Interventions: Self-care/ADL training;Therapeutic exercise;Energy conservation;DME and/or AE instruction;Therapeutic activities;Patient/family education;Balance training    OT Goals(Current goals can be found in the care plan section) Acute Rehab OT Goals Patient Stated Goal: to go home OT Goal Formulation: With patient/family Time For Goal Achievement: 12/06/22 Potential to Achieve Goals: Good ADL Goals Pt Will Perform Grooming: with modified independence;standing Pt Will Perform Lower Body Dressing: with modified independence;sit to/from stand;with adaptive equipment Pt Will Transfer to Toilet: with modified independence;ambulating;regular height toilet  OT Frequency: Min 3X/week    Co-evaluation              AM-PAC OT "6 Clicks" Daily Activity     Outcome Measure Help from another person eating meals?: None Help from another person taking care of personal grooming?: A Little Help from another person toileting, which includes using toliet, bedpan, or urinal?: A Little Help from another person bathing (including washing, rinsing, drying)?: A Lot Help from another person to put on and taking  off regular upper body clothing?: A Little Help from another person to put on and taking off regular lower body clothing?: A Lot 6 Click Score: 17   End of Session    Activity Tolerance: Patient tolerated treatment well Patient left: with call bell/phone within reach;in chair;with nursing/sitter in room;with family/visitor present  OT Visit Diagnosis: Other abnormalities of gait and mobility (R26.89);Muscle weakness (generalized) (M62.81)                Time: 7341-9379 OT Time Calculation (min): 44 min Charges:  OT General Charges $OT Visit: 1 Visit OT Evaluation $OT Eval Moderate Complexity: 1 Mod OT Treatments $Self Care/Home Management : 38-52 mins  Kathie Dike, M.S. OTR/L  11/22/22, 10:44 AM  ascom [phone]

## 2022-11-22 NOTE — Progress Notes (Signed)
Mobility Specialist - Progress Note   11/22/22 1200  Mobility  Activity Ambulated with assistance in room;Ambulated with assistance in hallway  Level of Assistance Standby assist, set-up cues, supervision of patient - no hands on  Assistive Device Front wheel walker  Distance Ambulated (ft) 35 ft  Activity Response Tolerated well  $Mobility charge 1 Mobility     Pt sitting in recliner upon arrival, utilizing RA. Does voice pain in R shoulder but agreeable to activity. Pt able to complete 2 STS from chair with minA + RW. No LOB. Ambulated just outside of room with minG. Distance limited by fatigue. Forward-flex lean; VC for corrective posture and maintaining forward gaze. Pt reports BUE aching from weight-bearing onto RW. Pt returned to recliner with alarm set, needs in reach.    Filiberto Pinks Mobility Specialist 11/22/22, 12:42 PM

## 2022-11-22 NOTE — Progress Notes (Signed)
Discharge instructions were reviewed with patient and patient's daughter. Questions were answered and encouraged. IV was taken out. Belongings collected by patient's daughter.

## 2022-11-22 NOTE — Progress Notes (Signed)
Pt was ordered CPAP QHS for OSA but states she has not wore her machine in years and does not want to wear our machine here.

## 2022-11-22 NOTE — Progress Notes (Signed)
Patient is not able to walk the distance required to go the bathroom, or he/she is unable to safely negotiate stairs required to access the bathroom.  A 3in1 BSC will alleviate this problem  

## 2022-11-22 NOTE — Discharge Summary (Signed)
Physician Discharge Summary   Patient: Vickie Kemp MRN: [id number] DOB: April 16, 1941  Admit date:     11/20/2022  Discharge date: 11/22/22  Discharge Physician: Alberteen Sam   PCP: Glori Luis, MD     Recommendations at discharge:  Follow up with new PCP at [facility] in 4-5 days Follow up with [facility] in 4-6 weeks for neuropathy Dr. Radonna Ricker and [facility]: Inpatient Neurology recommendations were for establishment with dedicated pain management clinic --> if [facility] is not able to provide comprehensive pain management, please refer to Pain Medicine  [facility]:  Please follow up the following pending labs: Vitamin E Copper Zinc HIV Multiple Myeloma panel ANA MMA   Dr. Radonna Ricker: Please continue B12 supplement and repeat B12 level at appropriate interval Please obtain follow up imaging as appropriate for thyroid nodule (see below)        Discharge Diagnoses: Principal Problem:   Ambulatory dysfunction and fall at home due to known degenerative disk disease overlaid diabetic nephropathy  Active Problems:   Rhabdomyolysis   Cystitis ruled out   Type 2 diabetes mellitus with chronic kidney disease   Coronary artery disease involving native coronary artery of native heart without angina pectoris   Obesity   Stage 3b chronic kidney disease   Essential hypertension   Chronic venous insufficiency   Dyslipidemia   Thyroid nodule      Hospital Course: Vickie Kemp is an 82 y.o. F with DM, obesity, OSA not on CPAP, lymphedema, CKD IIIb baseline 1.2-1.5, and CAD remote PCI, who presented with acute generalized weakness and fall found down by family.  In the ER, CK 800, CTH and CT C-spine unremarkable.     * Ambulatory dysfunction and fall at home Evaluated by Neurology.  MRI brain unremarkable.  Gait disorder felt to be multifactorial secondary to known degenerative disc disease, with  overlaid neuropathy secondary to diabetes and metabolic syndrome, as well as anxiety/fear of falling in an 82 y.o.    Neurology work up for neuropathy (ANA, SPEP, UPEP, IFE, HIV) and myelopathy (Zinc, copper, MMA, vitamin E) pending at discharge - Recommended follow up with Neurology     Rhabdomyolysis Due to prolonged period on the ground.  Treated with fluids and improving  Type 2 diabetes mellitus with chronic kidney disease A1c 7.2%  Acute lower UTI, ruled out  Dyslipidemia  Essential hypertension            The [facility] Controlled Substances Registry was reviewed for this patient prior to discharge.  Consultants: Neurology Procedures performed: CT head, CT C-spine, MRI brain Disposition: Home health Diet recommendation:  Carb modified diet  DISCHARGE MEDICATION: Allergies as of 11/22/2022       Reactions   Amoxicillin Itching        Medication List     TAKE these medications    acetaminophen 650 MG CR tablet Commonly known as: TYLENOL Take 650 mg by mouth 3 (three) times daily as needed.   amLODipine 5 MG tablet Commonly known as: NORVASC Take 1 tablet (5 mg total) by mouth daily.   aspirin EC 81 MG tablet Take 81 mg by mouth daily.   atorvastatin 40 MG tablet Commonly known as: LIPITOR Take 1 tablet (40 mg total) by mouth daily. Qhs   carvedilol 25 MG tablet Commonly known as: COREG TAKE 1 TABLET(25 MG) BY MOUTH TWICE DAILY   cetirizine 10 MG tablet Commonly known as: ZYRTEC Take 10 mg by  mouth daily.   cyanocobalamin 1000 MCG tablet Take 1 tablet (1,000 mcg total) by mouth daily. Start taking on: November 23, 2022   Fish Oil 1000 MG Caps Take 1,000 mg by mouth daily.   FreeStyle Libre 2 Sensor Misc Apply 1 each topically every 14 (fourteen) days.   Glyxambi 10-5 MG Tabs Generic drug: Empagliflozin-linaGLIPtin Take 1 tablet by mouth daily. What changed: Another medication with the same name was removed. Continue taking this  medication, and follow the directions you see here.   insulin lispro 100 UNIT/ML KwikPen Commonly known as: HumaLOG KwikPen Inject 8 Units into the skin 3 (three) times daily.   Insulin Pen Needle 32G X 4 MM Misc 1 Device by Does not apply route 4 (four) times daily - after meals and at bedtime.   Novofine Pen Needle 32G X 6 MM Misc Generic drug: Insulin Pen Needle Use one pen needle with Tresiba injection   latanoprost 0.005 % ophthalmic solution Commonly known as: XALATAN Place 1 drop into the left eye at bedtime.   losartan 100 MG tablet Commonly known as: COZAAR Take 100 mg by mouth daily.   OneTouch Ultra test strip Generic drug: glucose blood USE TO CHECK BLOOD SUGAR THREE TIMES DAILY   QC TUMERIC COMPLEX PO Take 1 capsule by mouth daily.   traMADol 50 MG tablet Commonly known as: ULTRAM Take 50 mg by mouth as needed for moderate pain.   Evaristo Bury FlexTouch 200 UNIT/ML FlexTouch Pen Generic drug: insulin degludec Inject 32 units into the skin daily   VITAMIN D3 PO Take 1 capsule by mouth daily.               Durable Medical Equipment  (From admission, onward)           Start     Ordered   11/22/22 1439  DME Walker  Once       Question Answer Comment  Walker: With 5 Inch Wheels   Patient needs a walker to treat with the following condition Fall      11/22/22 1442   11/22/22 1308  For home use only DME Walker rolling  Once       Question Answer Comment  Walker: With 5 Inch Wheels   Patient needs a walker to treat with the following condition Weakness      11/22/22 1308   11/22/22 1308  For home use only DME Bedside commode  Once       Question:  Patient needs a bedside commode to treat with the following condition  Answer:  Weakness   11/22/22 1308            Follow-up Information     [facility] Follow up in 1 week(s).   Why: November 24, 2022 11:50 AM  [phone] Contact information: [address] [place], [place]  [address] [place]                Discharge Instructions     Discharge instructions   Complete by: As directed    **IMPORTANT DISCHARGE INSTRUCTIONS**   From Dr. Maryfrances Bunnell: You were evaluated for a fall and back and neck pain.  You were seen here by our neurologist. Her assessment was that your pain is most likely from chronic arthritis of the spine, on top of chronic neuropathy (nerve pain) from diabetes, and old age.  We recommend that you make sure your vitamin B12 level is normal by taking vitamin B12 supplements (cyanocobalomin) for 1 month  Call [facility] Neurology, and arrange a follow up as soon as you are able [facility]'s number is listed below under Dr. Daisy Blossom name  They should follow up on the lab tests drawn here.  Next, go see your new primary care doctor at [facility] Have them refer you to a pain clinic. If they aren't able to make this referral, go see your Physiatrist at [facility] for help  Resume your other home medicines without change   Increase activity slowly   Complete by: As directed        Discharge Exam: Filed Weights   11/20/22 2335  Weight: 86.8 kg    General: Pt is alert, awake, not in acute distress, sitting up in a chair Cardiovascular: RRR, nl S1-S2, systolic murmur appreciated.   Trace nonpitting LE edema.   Respiratory: Normal respiratory rate and rhythm.  CTAB without rales or wheezes. Abdominal: Abdomen soft and non-tender.  No distension or HSM.   Neuro/Psych: Strength symmetric in upper and lower extremities.  Judgment and insight appear normal.  Generalized weakness.   Condition at discharge: good  The results of significant diagnostics from this hospitalization (including imaging, microbiology, ancillary and laboratory) are listed below for reference.   Imaging Studies: ECHOCARDIOGRAM COMPLETE  Result Date: 11/21/2022    ECHOCARDIOGRAM REPORT   Patient Name:   ADAORA MCHANEY Date of Exam: 11/21/2022  Medical Rec #:  [id number]       Height:       64.0 in Accession #:    [id number]      Weight:       191.3 lb Date of Birth:  [birth year]       BSA:          1.920 m Patient Age:    81 years        BP:           162/63 mmHg Patient Gender: F               HR:           60 bpm. Exam Location:  [facility] Procedure: 2D Echo, Cardiac Doppler and Color Doppler Indications:     Syncope R55  History:         Patient has prior history of Echocardiogram examinations, most                  recent 03/19/2020. CAD; Risk Factors:Diabetes, Hypertension and                  Sleep Apnea. CKD.  Sonographer:     Cristela Blue Referring Phys:  [id number] JAN A MANSY Diagnosing Phys: Yvonne Kendall MD IMPRESSIONS  1. Left ventricular ejection fraction, by estimation, is 60 to 65%. The left ventricle has normal function. The left ventricle has no regional wall motion abnormalities. There is moderate left ventricular hypertrophy. Left ventricular diastolic parameters are consistent with Grade I diastolic dysfunction (impaired relaxation). Elevated left atrial pressure.  2. Right ventricular systolic function is low normal. The right ventricular size is normal. There is moderately elevated pulmonary artery systolic pressure. The estimated right ventricular systolic pressure is 58.1 mmHg.  3. Left atrial size was mildly dilated.  4. The mitral valve is degenerative. Mild mitral valve regurgitation. No evidence of mitral stenosis.  5. Tricuspid valve regurgitation is mild to moderate.  6. The aortic valve is tricuspid. There is mild calcification of the aortic valve. There is mild thickening of the aortic valve. Aortic valve regurgitation is  trivial. Aortic valve sclerosis/calcification is present, without any evidence of aortic stenosis.  7. The inferior vena cava is normal in size with greater than 50% respiratory variability, suggesting right atrial pressure of 3 mmHg. FINDINGS  Left Ventricle: Left ventricular ejection fraction, by estimation, is 60  to 65%. The left ventricle has normal function. The left ventricle has no regional wall motion abnormalities. The left ventricular internal cavity size was normal in size. There is  moderate left ventricular hypertrophy. Left ventricular diastolic parameters are consistent with Grade I diastolic dysfunction (impaired relaxation). Elevated left atrial pressure. Right Ventricle: The right ventricular size is normal. No increase in right ventricular wall thickness. Right ventricular systolic function is low normal. There is moderately elevated pulmonary artery systolic pressure. The tricuspid regurgitant velocity  is 3.71 m/s, and with an assumed right atrial pressure of 3 mmHg, the estimated right ventricular systolic pressure is 58.1 mmHg. Left Atrium: Left atrial size was mildly dilated. Right Atrium: Right atrial size was normal in size. Pericardium: There is no evidence of pericardial effusion. Mitral Valve: The mitral valve is degenerative in appearance. There is mild thickening of the mitral valve leaflet(s). There is mild calcification of the mitral valve leaflet(s). Mild mitral valve regurgitation. No evidence of mitral valve stenosis. MV peak gradient, 5.0 mmHg. The mean mitral valve gradient is 3.0 mmHg. Tricuspid Valve: The tricuspid valve is not well visualized. Tricuspid valve regurgitation is mild to moderate. Aortic Valve: The aortic valve is tricuspid. There is mild calcification of the aortic valve. There is mild thickening of the aortic valve. Aortic valve regurgitation is trivial. Aortic valve sclerosis/calcification is present, without any evidence of aortic stenosis. Aortic valve mean gradient measures 4.0 mmHg. Aortic valve peak gradient measures 8.3 mmHg. Aortic valve area, by VTI measures 2.65 cm. Pulmonic Valve: The pulmonic valve was not well visualized. Pulmonic valve regurgitation is mild. No evidence of pulmonic stenosis. Aorta: The aortic root is normal in size and structure. Pulmonary  Artery: The pulmonary artery is not well seen. Venous: The inferior vena cava is normal in size with greater than 50% respiratory variability, suggesting right atrial pressure of 3 mmHg. IAS/Shunts: The interatrial septum was not well visualized.  LEFT VENTRICLE PLAX 2D LVIDd:         3.60 cm   Diastology LVIDs:         2.60 cm   LV e' medial:    5.11 cm/s LV PW:         1.40 cm   LV E/e' medial:  18.5 LV IVS:        1.30 cm   LV e' lateral:   4.35 cm/s LVOT diam:     2.00 cm   LV E/e' lateral: 21.7 LV SV:         85 LV SV Index:   44 LVOT Area:     3.14 cm  RIGHT VENTRICLE RV Basal diam:  4.20 cm RV Mid diam:    2.70 cm RV S prime:     12.90 cm/s TAPSE (M-mode): 2.3 cm LEFT ATRIUM             Index        RIGHT ATRIUM           Index LA diam:        3.00 cm 1.56 cm/m   RA Area:     14.30 cm LA Vol (A2C):   62.0 ml 32.30 ml/m  RA Volume:   37.60 ml  19.59 ml/m LA Vol (A4C):   83.5 ml 43.50 ml/m LA Biplane Vol: 74.0 ml 38.55 ml/m  AORTIC VALVE                    PULMONIC VALVE AV Area (Vmax):    2.18 cm     PR End Diast Vel: 9.86 msec AV Area (Vmean):   2.38 cm AV Area (VTI):     2.65 cm AV Vmax:           144.00 cm/s AV Vmean:          95.600 cm/s AV VTI:            0.320 m AV Peak Grad:      8.3 mmHg AV Mean Grad:      4.0 mmHg LVOT Vmax:         99.80 cm/s LVOT Vmean:        72.300 cm/s LVOT VTI:          0.270 m LVOT/AV VTI ratio: 0.85  AORTA Ao Root diam: 3.00 cm MITRAL VALVE                TRICUSPID VALVE MV Area (PHT): 3.07 cm     TR Peak grad:   55.1 mmHg MV Area VTI:   2.38 cm     TR Vmax:        371.00 cm/s MV Peak grad:  5.0 mmHg MV Mean grad:  3.0 mmHg     SHUNTS MV Vmax:       1.12 m/s     Systemic VTI:  0.27 m MV Vmean:      83.3 cm/s    Systemic Diam: 2.00 cm MV Decel Time: 247 msec MV E velocity: 94.30 cm/s MV A velocity: 112.00 cm/s MV E/A ratio:  0.84 Findley Vi End MD Electronically signed by Yvonne Kendall MD Signature Date/Time: 11/21/2022/4:26:01 PM    Final    MR BRAIN WO  CONTRAST  Result Date: 11/21/2022 CLINICAL DATA:  Neuro deficit, acute, stroke suspected EXAM: MRI HEAD WITHOUT CONTRAST MRA HEAD WITHOUT CONTRAST TECHNIQUE: Multiplanar, multiecho pulse sequences of the brain and surrounding structures were obtained without intravenous contrast. Angiographic images of the Circle of Willis were obtained using MRA technique without intravenous contrast. COMPARISON:  None Available. FINDINGS: MRI HEAD FINDINGS Brain: No acute infarction, hemorrhage, hydrocephalus, extra-axial collection or mass lesion. Small remote lacunar infarct in the right frontal white matter. Mild for age chronic microvascular ischemic change. Vascular: See below. Skull and upper cervical spine: Normal marrow signal. Sinuses/Orbits: Mild paranasal sinus mucosal thickening. No acute orbital findings. Other: No mastoid effusions. MRA HEAD FINDINGS Anterior circulation: Small/hypoplastic left A1 ACA with probably moderate stenosis. Bilateral intracranial ICAs, MCAs and ACAs are patent without proximal hemodynamically significant stenosis. Posterior circulation: Bilateral intradural vertebral arteries, basilar artery and bilateral posterior cerebral arteries are patent without proximal hemodynamically significant stenosis. IMPRESSION: 1. No evidence of acute intracranial abnormality. 2. No large vessel occlusion. 3. Small/hypoplastic left A1 ACA with probably moderate stenosis. Electronically Signed   By: Feliberto Harts M.D.   On: 11/21/2022 10:07   MR ANGIO HEAD WO CONTRAST  Result Date: 11/21/2022 CLINICAL DATA:  Neuro deficit, acute, stroke suspected EXAM: MRI HEAD WITHOUT CONTRAST MRA HEAD WITHOUT CONTRAST TECHNIQUE: Multiplanar, multiecho pulse sequences of the brain and surrounding structures were obtained without intravenous contrast. Angiographic images of the Circle of Willis were obtained using MRA technique without intravenous contrast. COMPARISON:  None Available. FINDINGS: MRI HEAD  FINDINGS  Brain: No acute infarction, hemorrhage, hydrocephalus, extra-axial collection or mass lesion. Small remote lacunar infarct in the right frontal white matter. Mild for age chronic microvascular ischemic change. Vascular: See below. Skull and upper cervical spine: Normal marrow signal. Sinuses/Orbits: Mild paranasal sinus mucosal thickening. No acute orbital findings. Other: No mastoid effusions. MRA HEAD FINDINGS Anterior circulation: Small/hypoplastic left A1 ACA with probably moderate stenosis. Bilateral intracranial ICAs, MCAs and ACAs are patent without proximal hemodynamically significant stenosis. Posterior circulation: Bilateral intradural vertebral arteries, basilar artery and bilateral posterior cerebral arteries are patent without proximal hemodynamically significant stenosis. IMPRESSION: 1. No evidence of acute intracranial abnormality. 2. No large vessel occlusion. 3. Small/hypoplastic left A1 ACA with probably moderate stenosis. Electronically Signed   By: Feliberto HartsFrederick S Jones M.D.   On: 11/21/2022 10:07   US Carotid Bilateral  Result Date: 11/21/2022 CLINICAL DATA:  Syncope and collapse EXAM: BILATERAL CAROTID DUPLEX ULTRASOUND TECHNIQUE: Wallace CullensGray scale imaging, color Doppler and duplex ultrasound were performed of bilateral carotid and vertebral arteries in the neck. COMPARISON:  None Available. FINDINGS: Criteria: Quantification of carotid stenosis is based on velocity parameters that correlate the residual internal carotid diameter with NASCET-based stenosis levels, using the diameter of the distal internal carotid lumen as the denominator for stenosis measurement. The following velocity measurements were obtained: RIGHT ICA: 78/14 cm/sec CCA: 89/14 cm/sec SYSTOLIC ICA/CCA RATIO:  0.9 ECA: 90 cm/sec LEFT ICA: 49/8 cm/sec CCA: 62/8 cm/sec SYSTOLIC ICA/CCA RATIO:  0.8 ECA: 189 cm/sec RIGHT CAROTID ARTERY: Mixed echogenicity partially calcified carotid bifurcation atherosclerosis. Despite this, no  significant ICA stenosis, velocity elevation, turbulent flow. No significant waveform abnormality. Degree of stenosis less than 50% by ultrasound criteria. RIGHT VERTEBRAL ARTERY:  Normal antegrade flow LEFT CAROTID ARTERY: Similar calcified bifurcation atherosclerosis. No significant velocity elevation, turbulent flow, or significant stenosis. No waveform abnormality. Degree of narrowing also less than 50% by ultrasound criteria. LEFT VERTEBRAL ARTERY:  Normal antegrade flow IMPRESSION: 1. Bilateral carotid atherosclerosis. Negative for significant stenosis. Degree of narrowing less than 50% bilaterally by ultrasound criteria. 2. Patent antegrade vertebral flow bilaterally. Electronically Signed   By: Judie PetitM.  Shick M.D.   On: 11/21/2022 09:01   DG Chest Portable 1 View  Result Date: 11/21/2022 CLINICAL DATA:  Recent fall EXAM: PORTABLE CHEST 1 VIEW COMPARISON:  04/24/2020 FINDINGS: Cardiac shadow is mildly prominent but accentuated by the portable technique. Aortic calcifications are seen. The lungs are clear bilaterally. Bony abnormality is seen. IMPRESSION: No active disease. Electronically Signed   By: Alcide CleverMark  Lukens M.D.   On: 11/21/2022 00:38   CT Cervical Spine Wo Contrast  Result Date: 11/21/2022 CLINICAL DATA:  Status post fall. EXAM: CT CERVICAL SPINE WITHOUT CONTRAST TECHNIQUE: Multidetector CT imaging of the cervical spine was performed without intravenous contrast. Multiplanar CT image reconstructions were also generated. RADIATION DOSE REDUCTION: This exam was performed according to the departmental dose-optimization program which includes automated exposure control, adjustment of the mA and/or kV according to patient size and/or use of iterative reconstruction technique. COMPARISON:  None Available. FINDINGS: Alignment: There is straightening of the normal cervical spine lordosis. Skull base and vertebrae: No acute fracture. No primary bone lesion or focal pathologic process. Soft tissues and spinal  canal: No prevertebral fluid or swelling. No visible canal hematoma. Disc levels: Marked severity endplate sclerosis, anterior osteophyte formation and posterior bony spurring are seen at the levels of C5-C6, C6-C7 and C7-T1. Moderate severity anterior osteophyte formation is present at the level of C4-C5. There is moderate severity narrowing of  the anterior atlantoaxial articulation. Marked severity intervertebral disc space narrowing is seen at the levels of C5-C6, C6-C7 and C7-T1. Bilateral mild-to-moderate severity multilevel facet joint hypertrophy is noted. Upper chest: Negative. Other: The thyroid gland is enlarged and heterogeneous in appearance. IMPRESSION: 1. No acute fracture or subluxation in the cervical spine. 2. Marked severity multilevel degenerative changes, as described above. 3. Enlarged and heterogeneous thyroid gland. Follow-up with nonemergent thyroid ultrasound is recommended. This follows ACR consensus guidelines: Managing Incidental Thyroid Nodules Detected on Imaging: White Paper of the ACR Incidental Thyroid Findings Committee. J Am Coll Radiol 2015; 12:143-150. Electronically Signed   By: Aram Candela M.D.   On: 11/21/2022 00:12   CT HEAD WO CONTRAST ( )  Result Date: 11/21/2022 CLINICAL DATA:  Status post fall. EXAM: CT HEAD WITHOUT CONTRAST TECHNIQUE: Contiguous axial images were obtained from the base of the skull through the vertex without intravenous contrast. RADIATION DOSE REDUCTION: This exam was performed according to the departmental dose-optimization program which includes automated exposure control, adjustment of the mA and/or kV according to patient size and/or use of iterative reconstruction technique. COMPARISON:  None Available. FINDINGS: Brain: There is mild cerebral atrophy with widening of the extra-axial spaces and ventricular dilatation. There are areas of decreased attenuation within the white matter tracts of the supratentorial brain, consistent with  microvascular disease changes. Bilateral basal ganglia calcification is seen. A small chronic right frontoparietal white matter infarct is noted. Vascular: No hyperdense vessel or unexpected calcification. Skull: Normal. Negative for fracture or focal lesion. Sinuses/Orbits: There is moderate severity left maxillary sinus mucosal thickening. Other: None. IMPRESSION: 1. No acute intracranial abnormality. 2. Generalized cerebral atrophy with widening of the extra-axial spaces and ventricular dilatation. 3. Moderate severity left maxillary sinus disease. Electronically Signed   By: Aram Candela M.D.   On: 11/21/2022 00:07   US THYROID  Result Date: 11/13/2022 CLINICAL DATA:  Incidental on CT. Isthmic nodule noted on neck CTA performed 10/27/2022 EXAM: THYROID ULTRASOUND TECHNIQUE: Ultrasound examination of the thyroid gland and adjacent soft tissues was performed. COMPARISON:  Neck CTA-10/27/2022 FINDINGS: Parenchymal Echotexture: Markedly heterogenous Isthmus: Enlarged measuring 0.9 cm in diameter Right lobe: Normal in size measuring 4.7 x 2.2 x 2.1 cm Left lobe: Normal in size measuring 5.0 x 2.3 x 2.3 cm _________________________________________________________ Estimated total number of nodules >/= 1 cm: 4 Number of spongiform nodules >/=  2 cm not described below (TR1): 0 Number of mixed cystic and solid nodules >/= 1.5 cm not described below (TR2): 0 _________________________________________________________ Nodule # 1: Location: Isthmus; Mid - correlates with the nodule seen on preceding neck CTA Maximum size: 2.0 cm; Other 2 dimensions: 1.8 x 1.2 cm Composition: solid/almost completely solid (2) Echogenicity: isoechoic (1) Shape: not taller-than-wide (0) Margins: extra-thyroidal extension (3) Echogenic foci: none (0) ACR TI-RADS total points: 6. ACR TI-RADS risk category: TR4 (4-6 points). ACR TI-RADS recommendations: **Given size (>/= 1.5 cm) and appearance, fine needle aspiration of this moderately  suspicious nodule should be considered based on TI-RADS criteria. _________________________________________________________ There is an approximately 1.8 x 1.7 x 1.7 cm spongiform/benign-appearing nodule within the superior pole of the right lobe of the thyroid (labeled 2), which does not meet criteria to recommend percutaneous sampling or continued dedicated follow-up. _________________________________________________________ Nodule # 3: Location: Right; Inferior Maximum size: 2.8 cm; Other 2 dimensions: 2.3 x 1.8 cm Composition: solid/almost completely solid (2) Echogenicity: isoechoic (1) Shape: not taller-than-wide (0) Margins: smooth (0) Echogenic foci: punctate echogenic foci (3) ACR TI-RADS total points: 6. ACR  TI-RADS risk category: TR4 (4-6 points). ACR TI-RADS recommendations: **Given size (>/= 1.5 cm) and appearance, fine needle aspiration of this moderately suspicious nodule should be considered based on TI-RADS criteria. _________________________________________________________ Nodule # 4: Location: Left; Mid Maximum size: 3.3 cm; Other 2 dimensions: 2.8 x 2.7 cm Composition: solid/almost completely solid (2) Echogenicity: isoechoic (1) Shape: not taller-than-wide (0) Margins: ill-defined (0) Echogenic foci: none (0) ACR TI-RADS total points: 3. ACR TI-RADS risk category: TR3 (3 points). ACR TI-RADS recommendations: **Given size (>/= 2.5 cm) and appearance, fine needle aspiration of this mildly suspicious nodule should be considered based on TI-RADS criteria. _________________________________________________________ IMPRESSION: 1. Findings suggestive of multinodular goiter. 2. Nodules labeled #1, #3 and #4 all meet imaging criteria to recommend percutaneous sampling. Current imaging recommendations are to only biopsy the two most worrisome nodules at a given time. As such, nodules labeled #1 and #3 could undergo ultrasound-guided fine-needle aspiration as indicated. (Note, nodule labeled #1 correlates  with the nodule seen on preceding neck CTA). The above is in keeping with the ACR TI-RADS recommendations - J Am Coll Radiol 2017;14:587-595. Electronically Signed   By: Simonne Come M.D.   On: 11/13/2022 16:58   CT ANGIO NECK W OR WO CONTRAST  Result Date: 10/31/2022 CLINICAL DATA:  Abnormal appearance of the proximal right vertebral artery on a recent cervical spine MRI, possible high-grade stenosis or occlusion. EXAM: CT ANGIOGRAPHY NECK TECHNIQUE: Multidetector CT imaging of the neck was performed using the standard protocol during bolus administration of intravenous contrast. Multiplanar CT image reconstructions and MIPs were obtained to evaluate the vascular anatomy. Carotid stenosis measurements (when applicable) are obtained utilizing NASCET criteria, using the distal internal carotid diameter as the denominator. RADIATION DOSE REDUCTION: This exam was performed according to the departmental dose-optimization program which includes automated exposure control, adjustment of the mA and/or kV according to patient size and/or use of iterative reconstruction technique. CONTRAST:  60mL OMNIPAQUE IOHEXOL 350 MG/ML SOLN COMPARISON:  Cervical spine MRI 10/12/2022 FINDINGS: Aortic arch: Standard 3 vessel aortic arch with mild atherosclerotic plaque. Widely patent brachiocephalic and subclavian arteries. Right carotid system: Patent with eccentric, soft plaque in the mid common carotid artery and small volume, mixed calcified and soft plaque at the carotid bifurcation and in the proximal ICA. No evidence of a significant stenosis (50% or greater) or dissection. Left carotid system: Patent with a small amount of mixed calcified and soft plaque scattered in the common carotid artery and in the proximal ICA. No evidence of a significant stenosis or dissection. Vertebral arteries: Patent with calcified plaque at the vertebral origins, more notable on the left. No evidence of a significant stenosis or dissection. Mildly  dominant left vertebral artery. The right vertebral artery does not enter the transverse foramina until the C5 level (compared with the left vertebral artery which enters normally at C6), and this normal variant course accounts for the appearance on MRI. Skeleton: Cervical disc degeneration, more fully characterized on the recent spine MRI. Other neck: Diffusely heterogeneous and mildly enlarged thyroid gland including a 1.8 cm nodule in the isthmus. No evidence of cervical lymphadenopathy. Upper chest: Clear lung apices. IMPRESSION: 1. Patent cervical carotid and vertebral arteries with mild atherosclerosis and no significant stenosis. 2. 1.8 cm incidental thyroid nodule with heterogeneous and enlarged thyroid. Recommend non-emergent thyroid ultrasound. Reference: J Am Coll Radiol. 2015 Feb;12(2): 143-50 3.  Aortic Atherosclerosis (ICD10-I70.0). Electronically Signed   By: Sebastian Ache M.D.   On: 10/31/2022 12:30    Microbiology: Results for  orders placed or performed during the hospital encounter of 11/20/22  Urine Culture (for pregnant, neutropenic or urologic patients or patients with an indwelling urinary catheter)     Status: Abnormal   Collection Time: 11/20/22 11:37 PM   Specimen: Urine, Catheterized  Result Value Ref Range Status   Specimen Description   Final    URINE, CATHETERIZED Performed at [facility], [address] [place], [place] [address]    Special Requests   Final    NONE Performed at [facility], [address] [place], [place] [address]    Culture MULTIPLE SPECIES PRESENT, SUGGEST RECOLLECTION (A)  Final   Report Status 11/22/2022 FINAL  Final    Labs: CBC: Recent Labs  Lab 11/21/22 0032 11/21/22 0529 11/22/22 0811  WBC 11.6* 10.2 10.6*  NEUTROABS 8.4*  --   --   HGB 13.7 12.2 12.0  HCT 44.8 38.8 37.8  MCV 92.2 91.5 89.6  PLT 175 172 179   Basic Metabolic Panel: Recent Labs  Lab 11/20/22 2337 11/21/22 0529 11/22/22 0811  NA 139  140 141  K 3.5 3.2* 4.3  CL 106 109 111  CO2 22 23 24   GLUCOSE 92 76 108*  BUN 26* 24* 22  CREATININE 1.18* 1.06* 1.26*  CALCIUM 9.4 8.8* 8.8*  MG 2.0  --   --    Liver Function Tests: Recent Labs  Lab 11/20/22 2337  AST 39  ALT 16  ALKPHOS 92  BILITOT 1.1  PROT 7.8  ALBUMIN 3.7   CBG: Recent Labs  Lab 11/21/22 2359 11/22/22 0335 11/22/22 0740 11/22/22 1113 11/22/22 1518  GLUCAP 119* 116* 102* 173* 228*    Discharge time spent: approximately 35 minutes spent on discharge counseling, evaluation of patient on day of discharge, and coordination of discharge planning with nursing, social work, pharmacy and case management  Signed: Alberteen Sam, MD [facility] 11/22/2022

## 2022-11-22 NOTE — TOC Progression Note (Signed)
Transition of Care [facility]) - Progression Note    Patient Details  Name: Vickie Kemp MRN: [id number] Date of Birth: Mar 08, 1941  [facility]) CM/SW Contact  Chapman Fitch, RN Phone Number: 11/22/2022, 1:49 PM  Clinical Narrative:      PT recommending home health.  OT recommending SNF. Patient is adamant that she would like to return home. States she does not have a preference of home health agency.  Referral sent to [facility] with [facility] for review.  Referral for RW and BSC made to [facility] with [facility]  PT has updated sister as she will be assisting providing care       Expected Discharge Plan and Services                                               Social Determinants of Health (SDOH) Interventions SDOH Screenings   Food Insecurity: No Food Insecurity (11/21/2022)  Housing: Low Risk  (11/21/2022)  Transportation Needs: No Transportation Needs (11/21/2022)  Utilities: Not At Risk (11/21/2022)  Depression (PHQ2-9): Low Risk  (10/16/2022)  Financial Resource Strain: Low Risk  (02/23/2022)  Social Connections: Unknown (02/23/2022)  Stress: No Stress Concern Present (02/23/2022)  Tobacco Use: Low Risk  (11/21/2022)    Readmission Risk Interventions     No data to display

## 2022-11-22 NOTE — TOC Transition Note (Signed)
Transition of Care [facility]) - CM/SW Discharge Note   Patient Details  Name: Eliz Vitatoe MRN: [id number] Date of Birth: Jan 16, 1941  Transition of Care [facility]) CM/SW Contact:  Chapman Fitch, RN Phone Number: 11/22/2022, 2:17 PM   Clinical Narrative:     Barbara Cower with [facility] confirms they can accept referral Notified of dc         Patient Goals and CMS Choice      Discharge Placement                         Discharge Plan and Services Additional resources added to the After Visit Summary for                                       Social Determinants of Health (SDOH) Interventions SDOH Screenings   Food Insecurity: No Food Insecurity (11/21/2022)  Housing: Low Risk  (11/21/2022)  Transportation Needs: No Transportation Needs (11/21/2022)  Utilities: Not At Risk (11/21/2022)  Depression (PHQ2-9): Low Risk  (10/16/2022)  Financial Resource Strain: Low Risk  (02/23/2022)  Social Connections: Unknown (02/23/2022)  Stress: No Stress Concern Present (02/23/2022)  Tobacco Use: Low Risk  (11/21/2022)     Readmission Risk Interventions     No data to display

## 2022-11-22 NOTE — Progress Notes (Signed)
Physical Therapy Treatment Patient Details Name: Vickie Kemp MRN: [id number] DOB: 03-25-1941 Today's Date: 11/22/2022   History of Present Illness Vickie Kemp is a 82 y.o. African-American female with medical history significant for coronary artery disease, stage IIIb chronic kidney disease, type 2 diabetes mellitus, and OSA, who presented to the ER with acute onset of fall with generalized weakness.    PT Comments    Pt was sitting in recliner upon arrival. She is A and O x 4. Slow processing and response noted. Pt was able to perform all task desired of her with increased time + min assist. Vcs for posture correct and technique improvements throughout session. She was able to tolerate ambulation ~ 50 ft prior to requesting seated rest. Discussed post acute care. Pt and family are planning to return home with HHPT to follow. Acute PT will continue to follow per current POC.    Recommendations for follow up therapy are one component of a multi-disciplinary discharge planning process, led by the attending physician.  Recommendations may be updated based on patient status, additional functional criteria and insurance authorization.     Assistance Recommended at Discharge Frequent or constant Supervision/Assistance  Patient can return home with the following A little help with walking and/or transfers;A little help with bathing/dressing/bathroom;Assistance with cooking/housework;Direct supervision/assist for medications management;Direct supervision/assist for financial management;Assist for transportation;Help with stairs or ramp for entrance   Equipment Recommendations  Rolling walker (2 wheels);BSC/3in1       Precautions / Restrictions Precautions Precautions: Fall Restrictions Weight Bearing Restrictions: No     Mobility  Bed Mobility  General bed mobility comments: pt was in recliner pre/post session    Transfers Overall transfer level: Needs assistance Equipment used:  Rolling walker (2 wheels) Transfers: Sit to/from Stand Sit to Stand: Min assist  General transfer comment: Min assist to stand from recliner/standard height surface.increased time required with vcs for improved technique and sequencing    Ambulation/Gait Ambulation/Gait assistance: Min guard Gait Distance (Feet): 45 Feet Assistive device: Rolling walker (2 wheels) Gait Pattern/deviations: Step-through pattern, Decreased stride length, Trunk flexed Gait velocity: decreased  General Gait Details: pt with very slow movement ambulating and requiring verbal and tactile feedback for upright posture.   Balance Overall balance assessment: Needs assistance Sitting-balance support: No upper extremity supported, Feet supported Sitting balance-Leahy Scale: Good     Standing balance support: Bilateral upper extremity supported, During functional activity Standing balance-Leahy Scale: Fair       Cognition Arousal/Alertness: Awake/alert Behavior During Therapy: WFL for tasks assessed/performed Overall Cognitive Status: Within Functional Limits for tasks assessed    General Comments: pt is A and O x 4           General Comments General comments (skin integrity, edema, etc.): Discussed at length, home environment and DC planning. pt is unwilling to DC to SNF and is planning to DC home with HHPT. discussed assistance available with pt's sister Malachi Bonds). Per family, They are able to provide 24/7 assistance and are agreeable to HHPT/OT/ and aides at DC      Pertinent Vitals/Pain Pain Assessment Pain Assessment: No/denies pain Faces Pain Scale: No hurt Pain Intervention(s): Limited activity within patient's tolerance, Monitored during session, Premedicated before session, Repositioned    Home Living Family/patient expects to be discharged to:: Private residence Living Arrangements: Alone Available Help at Discharge: Family;Available PRN/intermittently Type of Home: Apartment Home Access:  Level entry       Home Layout: One level Home Equipment: Rollator (4 wheels);Toilet riser;Grab bars -  toilet;Grab bars - tub/shower;Hand held shower head          PT Goals (current goals can now be found in the care plan section) Acute Rehab PT Goals Patient Stated Goal: "Go home with assistance and get stronger.' Progress towards PT goals: Progressing toward goals    Frequency    Min 3X/week      PT Plan Current plan remains appropriate       AM-PAC PT "6 Clicks" Mobility   Outcome Measure  Help needed turning from your back to your side while in a flat bed without using bedrails?: A Little Help needed moving from lying on your back to sitting on the side of a flat bed without using bedrails?: A Little Help needed moving to and from a bed to a chair (including a wheelchair)?: A Little Help needed standing up from a chair using your arms (e.g., wheelchair or bedside chair)?: A Little Help needed to walk in hospital room?: A Little Help needed climbing 3-5 steps with a railing? : A Lot 6 Click Score: 17    End of Session   Activity Tolerance: Patient tolerated treatment well Patient left: in chair;with call bell/phone within reach;with chair alarm set Nurse Communication: Mobility status;Other (comment) PT Visit Diagnosis: Unsteadiness on feet (R26.81);Other abnormalities of gait and mobility (R26.89);Repeated falls (R29.6);Muscle weakness (generalized) (M62.81);History of falling (Z91.81);Difficulty in walking, not elsewhere classified (R26.2);Adult, failure to thrive (R62.7);Pain Pain - Right/Left: Right Pain - part of body: Knee     Time: 1610-9604 PT Time Calculation (min) (ACUTE ONLY): 25 min  Charges:  $Gait Training: 8-22 mins $Therapeutic Activity: 8-22 mins                     Jetta Lout PTA 11/22/22, 1:46 PM

## 2022-11-22 NOTE — Hospital Course (Signed)
Vickie Kemp is an 82 y.o. F with DM, obesity, OSA not on CPAP, lymphedema, CKD IIIb baseline 1.2-1.5, and CAD remote PCI, who presented with acute generalized weakness and fall found down by family.  In the ER, CK 800, CTH and cspine negative.

## 2022-11-22 NOTE — Progress Notes (Signed)
Neurology Progress Note   Subjective: -Feeling better today, continues to have some ambulatory issues -Denies anosmia or any longstanding constipation  Exam: Vitals:   11/22/22 0338 11/22/22 0738  BP: (!) 157/51 (!) 161/56  Pulse: 66 66  Resp: 20 17  Temp: 99 F (37.2 C) 99.2 F (37.3 C)  SpO2: 95% 97%   Gen: In bed, comfortable  Resp: non-labored breathing, no grossly audible wheezing Cardiac: Perfusing extremities well  Abd: soft, nt  Neuro: MS: Awake, alert, conversant, oriented CN: Improved left eye opening today, continues to have a slight left facial droop Motor: Markedly improved effort in hip flexion today, 5/5 bilaterally and reporting no pain.  Limited range of motion with right upper extremity, reporting pain when she tries to reach her hand up behind her ear but has better passive movement than active movement Sensory: Reports no length dependent loss of sensation in the right upper extremity Cerebellar: On repeat finger tapping, she does have some decrement but then also irregularity of tapping with sudden increase in amplitude intermittently. Gait: Short shuffling steps, with many steps required to turn.  Very kyphotic posture, improved significantly with verbal reminders, reports she has pain when she tries to straighten her spine which pulls her back down into a kyphotic posture  Pertinent Labs:  Basic Metabolic Panel: Recent Labs  Lab 11/20/22 2337 11/21/22 0529 11/22/22 0811  NA 139 140 141  K 3.5 3.2* 4.3  CL 106 109 111  CO2 22 23 24   GLUCOSE 92 76 108*  BUN 26* 24* 22  CREATININE 1.18* 1.06* 1.26*  CALCIUM 9.4 8.8* 8.8*  MG 2.0  --   --     CBC: Recent Labs  Lab 11/21/22 0032 11/21/22 0529 11/22/22 0811  WBC 11.6* 10.2 10.6*  NEUTROABS 8.4*  --   --   HGB 13.7 12.2 12.0  HCT 44.8 38.8 37.8  MCV 92.2 91.5 89.6  PLT 175 172 179    Lab Results  Component Value Date   CKTOTAL 1,071 (H) 11/22/2022  (Improving)  B12 low normal at  383 Folate normal at 20.4 ESR normal at 17, CRP elevated at 3.4   Pending labs include SPEP/IFE/QIG ANA Zinc Copper Vitamin E MMA HIV  Impression: Most likely patient is experiencing multifactorial gait disorder secondary to known degenerative disc disease, with 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 Her CK elevation is perhaps out of proportion to the amount of time she was down, and rheumatological evaluation can be considered if she is not improving. Certainly her examination is a very pain/effort dependent, markedly improved today from yesterday. Some elements of her history are suggestive of potential parkinsonism, but she lacks the cardinal nonmotor features of longstanding constipation/anosmia characteristic of Parkinson's disease.  She expressed interest in psychiatric referral as well.  I did consider repeating spinal cord MRI (cervical, thoracic and lumbar spine imaging could be considered) but given her symptoms have actually improved greatly from yesterday, this is likely to be low yield testing  Recommendations: -Start B12 1000 mcg daily for goal B12 greater than 400 (ordered); if MMA results elevated this should be continued but if MMA results normal could be discontinued if patient prefers  -Referral to pain management clinic -Referral to psychiatry/psychology for cognitive behavioral approach to assist with pain management/anxiety -Continued physical therapy and encouragement of exercise -Patient prefers outpatient follow-up with [facility] neurology, Dr. Sherryll Burger -- should be given number to call for appointment [phone]  Brooke Dare MD-PhD [facility] [phone]  [facility] coverage for [facility] is from 8 AM to 4 AM in-house and 4 PM to 8 PM by telephone/video. 8 PM to 8 AM emergent questions or overnight urgent questions should be addressed to Teleneurology On-call or [facility]  neurohospitalist; contact information can be found on AMION  Greater than 50 minutes spent in care of this patient today, majority at bedside Recommendations conveyed to primary team via secure chat

## 2022-11-23 LAB — ENA+DNA/DS+ANTICH+CENTRO+JO...
Anti JO-1: <0.2 AI (ref 0.0–0.9)
Centromere Ab Screen: <0.2 AI (ref 0.0–0.9)
Chromatin Ab SerPl-aCnc: <0.2 AI (ref 0.0–0.9)
ENA SM Ab Ser-aCnc: <0.2 AI (ref 0.0–0.9)
Ribonucleic Protein: 0.2 AI (ref 0.0–0.9)
SSA (Ro) (ENA) Antibody, IgG: 1.9 AI — ABNORMAL HIGH (ref 0.0–0.9)
SSB (La) (ENA) Antibody, IgG: <0.2 AI (ref 0.0–0.9)
Scleroderma (Scl-70) (ENA) Antibody, IgG: <0.2 AI (ref 0.0–0.9)
ds DNA Ab: 1 IU/mL (ref 0–9)

## 2022-11-23 LAB — HIV ANTIBODY (ROUTINE TESTING W REFLEX): HIV Screen 4th Generation wRfx: NONREACTIVE

## 2022-11-23 LAB — ANA W/REFLEX IF POSITIVE: Anti Nuclear Antibody (ANA): POSITIVE — AB

## 2022-11-24 LAB — MULTIPLE MYELOMA PANEL, SERUM
Albumin SerPl Elph-Mcnc: 3.3 g/dL (ref 2.9–4.4)
Albumin/Glob SerPl: 1.1 (ref 0.7–1.7)
Alpha 1: 0.2 g/dL (ref 0.0–0.4)
Alpha2 Glob SerPl Elph-Mcnc: 0.7 g/dL (ref 0.4–1.0)
B-Globulin SerPl Elph-Mcnc: 0.9 g/dL (ref 0.7–1.3)
Gamma Glob SerPl Elph-Mcnc: 1.5 g/dL (ref 0.4–1.8)
Globulin, Total: 3.2 g/dL (ref 2.2–3.9)
IgA: 398 mg/dL (ref 64–422)
IgG (Immunoglobin G), Serum: 1620 mg/dL — ABNORMAL HIGH (ref 586–1602)
IgM (Immunoglobulin M), Srm: 58 mg/dL (ref 26–217)
Total Protein ELP: 6.5 g/dL (ref 6.0–8.5)

## 2022-11-24 LAB — COPPER, SERUM: Copper: 124 ug/dL (ref 80–158)

## 2022-11-24 LAB — ZINC: Zinc: 58 ug/dL (ref 44–115)

## 2022-11-25 ENCOUNTER — Telehealth: Payer: Self-pay | Admitting: Neurology

## 2022-11-25 LAB — VITAMIN E
Vitamin E (Alpha Tocopherol): 6.3 mg/L — ABNORMAL LOW (ref 9.0–29.0)
Vitamin E(Gamma Tocopherol): 0.9 mg/L (ref 0.5–4.9)

## 2022-11-25 NOTE — Telephone Encounter (Signed)
Attempted to reach Vickie Kemp but did not get an answer.  Subsequently called her power of attorney as the patient had given me permission to discuss any health information with her power of attorney Sister Malachi Bonds major.  We discussed that the patient's vitamin E level had come back low and she should reach out to her primary care physician for instructions on supplementation, as dose can vary based on underlying etiology of the deficiency.  We discussed that this may be playing a role in her gait dysfunction.  Also discussed that her ANA antibody and SSA antibodies had come back positive which again should be discussed with her primary care physician with potential referral to rheumatology for further workup/management   Brooke Dare, MD-PhD [facility] [phone]

## 2022-11-27 ENCOUNTER — Telehealth: Payer: Self-pay | Admitting: Family Medicine

## 2022-11-27 ENCOUNTER — Encounter (INDEPENDENT_AMBULATORY_CARE_PROVIDER_SITE_OTHER): Payer: Self-pay | Admitting: Vascular Surgery

## 2022-11-27 ENCOUNTER — Ambulatory Visit (INDEPENDENT_AMBULATORY_CARE_PROVIDER_SITE_OTHER): Payer: Medicare Other | Admitting: Vascular Surgery

## 2022-11-27 VITALS — BP 145/65 | HR 66 | Resp 16

## 2022-11-27 DIAGNOSIS — I2510 Atherosclerotic heart disease of native coronary artery without angina pectoris: Secondary | ICD-10-CM

## 2022-11-27 DIAGNOSIS — L97909 Non-pressure chronic ulcer of unspecified part of unspecified lower leg with unspecified severity: Secondary | ICD-10-CM | POA: Diagnosis not present

## 2022-11-27 DIAGNOSIS — I872 Venous insufficiency (chronic) (peripheral): Secondary | ICD-10-CM | POA: Diagnosis not present

## 2022-11-27 DIAGNOSIS — E1122 Type 2 diabetes mellitus with diabetic chronic kidney disease: Secondary | ICD-10-CM

## 2022-11-27 DIAGNOSIS — I10 Essential (primary) hypertension: Secondary | ICD-10-CM

## 2022-11-27 DIAGNOSIS — I83009 Varicose veins of unspecified lower extremity with ulcer of unspecified site: Secondary | ICD-10-CM

## 2022-11-27 DIAGNOSIS — N1832 Chronic kidney disease, stage 3b: Secondary | ICD-10-CM

## 2022-11-27 DIAGNOSIS — Z794 Long term (current) use of insulin: Secondary | ICD-10-CM

## 2022-11-27 MED ORDER — VITAMIN E 180 MG (400 UNIT) PO CAPS
400.0000 [IU] | ORAL_CAPSULE | Freq: Every day | ORAL | 1 refills | Status: DC
Start: 2022-11-27 — End: 2022-12-27

## 2022-11-27 NOTE — Telephone Encounter (Signed)
This patient needs hospital follow-up scheduled with me from her most recent hospitalization.  Please also let her know she is vitamin E deficient.  She needs to start on a vitamin E supplement. I have sent this to her pharmacy. We can discuss when to recheck that at her hospital follow-up visit.

## 2022-11-27 NOTE — Progress Notes (Signed)
MRN : [id number]  Vickie Kemp is a 82 y.o. (1941/03/25) female who presents with chief complaint of legs swell.  History of Present Illness:   The patient returns to the office for followup evaluation regarding leg swelling.  The swelling has persisted and the pain associated with swelling continues. There has been an interval development of an ulcerations/wounds on the right shin.  She was seen approximately 4 days ago by her primary care and the wound was wrapped.  She states it started as a blister..  Since the previous visit the patient has not been wearing graduated compression stockings and has noted little if any improvement in the lymphedema. The patient has been using compression routinely morning until night.  No outpatient medications have been marked as taking for the 11/27/22 encounter (Appointment) with Gilda Crease, Latina Craver, MD.    Past Medical History:  Diagnosis Date   Chronic kidney disease    Coronary artery disease    MI, pci to mid LAD 2014. LHC 80%OM, 60%RCA, 90% LAD   Diabetes mellitus without complication    Hypertension    Sleep apnea     Past Surgical History:  Procedure Laterality Date   ACNE CYST REMOVAL  2008   Groin area   CARDIAC CATHETERIZATION     COLONOSCOPY WITH PROPOFOL N/A 05/27/2020   Procedure: COLONOSCOPY WITH PROPOFOL;  Surgeon: Toney Reil, MD;  Location: [facility] ENDOSCOPY;  Service: Gastroenterology;  Laterality: N/A;   CORONARY ANGIOPLASTY      Social History Social History   Tobacco Use   Smoking status: Never   Smokeless tobacco: Never  Vaping Use   Vaping Use: Never used  Substance Use Topics   Alcohol use: Never   Drug use: Never    Family History Family History  Problem Relation Age of Onset   Diabetes Mother    Heart disease Father    Diabetes Sister    Diabetes Brother    Diabetes Brother    Breast cancer Neg Hx     Allergies  Allergen Reactions   Amoxicillin Itching     REVIEW OF SYSTEMS  (Negative unless checked)  Constitutional: [] Weight loss  [] Fever  [] Chills Cardiac: [] Chest pain   [] Chest pressure   [] Palpitations   [] Shortness of breath when laying flat   [] Shortness of breath with exertion. Vascular:  [] Pain in legs with walking   [x] Pain in legs with standing  [] History of DVT   [] Phlebitis   [x] Swelling in legs   [] Varicose veins   [] Non-healing ulcers Pulmonary:   [] Uses home oxygen   [] Productive cough   [] Hemoptysis   [] Wheeze  [] COPD   [] Asthma Neurologic:  [] Dizziness   [] Seizures   [] History of stroke   [] History of TIA  [] Aphasia   [] Vissual changes   [] Weakness or numbness in arm   [] Weakness or numbness in leg Musculoskeletal:   [] Joint swelling   [x] Joint pain   [] Low back pain Hematologic:  [] Easy bruising  [] Easy bleeding   [] Hypercoagulable state   [] Anemic Gastrointestinal:  [] Diarrhea   [] Vomiting  [] Gastroesophageal reflux/heartburn   [] Difficulty swallowing. Genitourinary:  [] Chronic kidney disease   [] Difficult urination  [] Frequent urination   [] Blood in urine Skin:  [] Rashes   [] Ulcers  Psychological:  [] History of anxiety   []  History of major depression.  Physical Examination  There were no vitals filed for this visit. There is no height or weight on file to calculate BMI. Gen: WD/WN, NAD Head: [place]/AT, No  temporalis wasting.  Ear/Nose/Throat: Hearing grossly intact, nares w/o erythema or drainage, pinna without lesions Eyes: PER, EOMI, sclera nonicteric.  Neck: Supple, no gross masses.  No JVD.  Pulmonary:  Good air movement, no audible wheezing, no use of accessory muscles.  Cardiac: RRR, precordium not hyperdynamic. Vascular:  scattered varicosities present bilaterally.  Mild venous stasis changes to the legs bilaterally.  3-4+ soft pitting edema bilateral, CEAP [id number].  There is a 3 cm x 2 cm wound right anterior shin which appears clean largely epithelialized and uninfected Vessel Right Left  Radial Palpable Palpable  Gastrointestinal:  soft, non-distended. No guarding/no peritoneal signs.  Musculoskeletal: M/S 5/5 throughout.  No deformity.  Neurologic: CN 2-12 intact. Pain and light touch intact in extremities.  Symmetrical.  Speech is fluent. Motor exam as listed above. Psychiatric: Judgment intact, Mood & affect appropriate for pt's clinical situation. Dermatologic: Venous rashes no ulcers noted.  No changes consistent with cellulitis. Lymph : No lichenification or skin changes of chronic lymphedema.  CBC Lab Results  Component Value Date   WBC 10.6 (H) 11/22/2022   HGB 12.0 11/22/2022   HCT 37.8 11/22/2022   MCV 89.6 11/22/2022   PLT 179 11/22/2022    BMET    Component Value Date/Time   NA 141 11/22/2022 0811   K 4.3 11/22/2022 0811   CL 111 11/22/2022 0811   CO2 24 11/22/2022 0811   GLUCOSE 108 (H) 11/22/2022 0811   BUN 22 11/22/2022 0811   CREATININE 1.26 (H) 11/22/2022 0811   CALCIUM 8.8 (L) 11/22/2022 0811   GFRNONAA 43 (L) 11/22/2022 0811   GFRAA 37 (L) 04/24/2020 1119   Estimated Creatinine Clearance: 37.2 mL/min (A) (by C-G formula based on SCr of 1.26 mg/dL (H)).  COAG No results found for: "INR", "PROTIME"  Radiology ECHOCARDIOGRAM COMPLETE  Result Date: 11/21/2022    ECHOCARDIOGRAM REPORT   Patient Name:   Vickie Kemp Date of Exam: 11/21/2022 Medical Rec #:  [id number]       Height:       64.0 in Accession #:    [id number]      Weight:       191.3 lb Date of Birth:  07-Oct-1940       BSA:          1.920 m Patient Age:    81 years        BP:           162/63 mmHg Patient Gender: F               HR:           60 bpm. Exam Location:  [facility] Procedure: 2D Echo, Cardiac Doppler and Color Doppler Indications:     Syncope R55  History:         Patient has prior history of Echocardiogram examinations, most                  recent 03/19/2020. CAD; Risk Factors:Diabetes, Hypertension and                  Sleep Apnea. CKD.  Sonographer:     Cristela Blue Referring Phys:  [id number] JAN A MANSY Diagnosing Phys:  Yvonne Kendall MD IMPRESSIONS  1. Left ventricular ejection fraction, by estimation, is 60 to 65%. The left ventricle has normal function. The left ventricle has no regional wall motion abnormalities. There is moderate left ventricular hypertrophy. Left ventricular diastolic parameters are consistent with Grade I diastolic dysfunction (impaired  relaxation). Elevated left atrial pressure.  2. Right ventricular systolic function is low normal. The right ventricular size is normal. There is moderately elevated pulmonary artery systolic pressure. The estimated right ventricular systolic pressure is 58.1 mmHg.  3. Left atrial size was mildly dilated.  4. The mitral valve is degenerative. Mild mitral valve regurgitation. No evidence of mitral stenosis.  5. Tricuspid valve regurgitation is mild to moderate.  6. The aortic valve is tricuspid. There is mild calcification of the aortic valve. There is mild thickening of the aortic valve. Aortic valve regurgitation is trivial. Aortic valve sclerosis/calcification is present, without any evidence of aortic stenosis.  7. The inferior vena cava is normal in size with greater than 50% respiratory variability, suggesting right atrial pressure of 3 mmHg. FINDINGS  Left Ventricle: Left ventricular ejection fraction, by estimation, is 60 to 65%. The left ventricle has normal function. The left ventricle has no regional wall motion abnormalities. The left ventricular internal cavity size was normal in size. There is  moderate left ventricular hypertrophy. Left ventricular diastolic parameters are consistent with Grade I diastolic dysfunction (impaired relaxation). Elevated left atrial pressure. Right Ventricle: The right ventricular size is normal. No increase in right ventricular wall thickness. Right ventricular systolic function is low normal. There is moderately elevated pulmonary artery systolic pressure. The tricuspid regurgitant velocity  is 3.71 m/s, and with an assumed right  atrial pressure of 3 mmHg, the estimated right ventricular systolic pressure is 58.1 mmHg. Left Atrium: Left atrial size was mildly dilated. Right Atrium: Right atrial size was normal in size. Pericardium: There is no evidence of pericardial effusion. Mitral Valve: The mitral valve is degenerative in appearance. There is mild thickening of the mitral valve leaflet(s). There is mild calcification of the mitral valve leaflet(s). Mild mitral valve regurgitation. No evidence of mitral valve stenosis. MV peak gradient, 5.0 mmHg. The mean mitral valve gradient is 3.0 mmHg. Tricuspid Valve: The tricuspid valve is not well visualized. Tricuspid valve regurgitation is mild to moderate. Aortic Valve: The aortic valve is tricuspid. There is mild calcification of the aortic valve. There is mild thickening of the aortic valve. Aortic valve regurgitation is trivial. Aortic valve sclerosis/calcification is present, without any evidence of aortic stenosis. Aortic valve mean gradient measures 4.0 mmHg. Aortic valve peak gradient measures 8.3 mmHg. Aortic valve area, by VTI measures 2.65 cm. Pulmonic Valve: The pulmonic valve was not well visualized. Pulmonic valve regurgitation is mild. No evidence of pulmonic stenosis. Aorta: The aortic root is normal in size and structure. Pulmonary Artery: The pulmonary artery is not well seen. Venous: The inferior vena cava is normal in size with greater than 50% respiratory variability, suggesting right atrial pressure of 3 mmHg. IAS/Shunts: The interatrial septum was not well visualized.  LEFT VENTRICLE PLAX 2D LVIDd:         3.60 cm   Diastology LVIDs:         2.60 cm   LV e' medial:    5.11 cm/s LV PW:         1.40 cm   LV E/e' medial:  18.5 LV IVS:        1.30 cm   LV e' lateral:   4.35 cm/s LVOT diam:     2.00 cm   LV E/e' lateral: 21.7 LV SV:         85 LV SV Index:   44 LVOT Area:     3.14 cm  RIGHT VENTRICLE RV Basal diam:  4.20 cm  RV Mid diam:    2.70 cm RV S prime:     12.90 cm/s  TAPSE (M-mode): 2.3 cm LEFT ATRIUM             Index        RIGHT ATRIUM           Index LA diam:        3.00 cm 1.56 cm/m   RA Area:     14.30 cm LA Vol (A2C):   62.0 ml 32.30 ml/m  RA Volume:   37.60 ml  19.59 ml/m LA Vol (A4C):   83.5 ml 43.50 ml/m LA Biplane Vol: 74.0 ml 38.55 ml/m  AORTIC VALVE                    PULMONIC VALVE AV Area (Vmax):    2.18 cm     PR End Diast Vel: 9.86 msec AV Area (Vmean):   2.38 cm AV Area (VTI):     2.65 cm AV Vmax:           144.00 cm/s AV Vmean:          95.600 cm/s AV VTI:            0.320 m AV Peak Grad:      8.3 mmHg AV Mean Grad:      4.0 mmHg LVOT Vmax:         99.80 cm/s LVOT Vmean:        72.300 cm/s LVOT VTI:          0.270 m LVOT/AV VTI ratio: 0.85  AORTA Ao Root diam: 3.00 cm MITRAL VALVE                TRICUSPID VALVE MV Area (PHT): 3.07 cm     TR Peak grad:   55.1 mmHg MV Area VTI:   2.38 cm     TR Vmax:        371.00 cm/s MV Peak grad:  5.0 mmHg MV Mean grad:  3.0 mmHg     SHUNTS MV Vmax:       1.12 m/s     Systemic VTI:  0.27 m MV Vmean:      83.3 cm/s    Systemic Diam: 2.00 cm MV Decel Time: 247 msec MV E velocity: 94.30 cm/s MV A velocity: 112.00 cm/s MV E/A ratio:  0.84 Christopher End MD Electronically signed by Yvonne Kendall MD Signature Date/Time: 11/21/2022/4:26:01 PM    Final    MR BRAIN WO CONTRAST  Result Date: 11/21/2022 CLINICAL DATA:  Neuro deficit, acute, stroke suspected EXAM: MRI HEAD WITHOUT CONTRAST MRA HEAD WITHOUT CONTRAST TECHNIQUE: Multiplanar, multiecho pulse sequences of the brain and surrounding structures were obtained without intravenous contrast. Angiographic images of the Circle of Willis were obtained using MRA technique without intravenous contrast. COMPARISON:  None Available. FINDINGS: MRI HEAD FINDINGS Brain: No acute infarction, hemorrhage, hydrocephalus, extra-axial collection or mass lesion. Small remote lacunar infarct in the right frontal white matter. Mild for age chronic microvascular ischemic change.  Vascular: See below. Skull and upper cervical spine: Normal marrow signal. Sinuses/Orbits: Mild paranasal sinus mucosal thickening. No acute orbital findings. Other: No mastoid effusions. MRA HEAD FINDINGS Anterior circulation: Small/hypoplastic left A1 ACA with probably moderate stenosis. Bilateral intracranial ICAs, MCAs and ACAs are patent without proximal hemodynamically significant stenosis. Posterior circulation: Bilateral intradural vertebral arteries, basilar artery and bilateral posterior cerebral arteries are patent without proximal hemodynamically significant stenosis. IMPRESSION: 1. No evidence of  acute intracranial abnormality. 2. No large vessel occlusion. 3. Small/hypoplastic left A1 ACA with probably moderate stenosis. Electronically Signed   By: Feliberto Harts M.D.   On: 11/21/2022 10:07   MR ANGIO HEAD WO CONTRAST  Result Date: 11/21/2022 CLINICAL DATA:  Neuro deficit, acute, stroke suspected EXAM: MRI HEAD WITHOUT CONTRAST MRA HEAD WITHOUT CONTRAST TECHNIQUE: Multiplanar, multiecho pulse sequences of the brain and surrounding structures were obtained without intravenous contrast. Angiographic images of the Circle of Willis were obtained using MRA technique without intravenous contrast. COMPARISON:  None Available. FINDINGS: MRI HEAD FINDINGS Brain: No acute infarction, hemorrhage, hydrocephalus, extra-axial collection or mass lesion. Small remote lacunar infarct in the right frontal white matter. Mild for age chronic microvascular ischemic change. Vascular: See below. Skull and upper cervical spine: Normal marrow signal. Sinuses/Orbits: Mild paranasal sinus mucosal thickening. No acute orbital findings. Other: No mastoid effusions. MRA HEAD FINDINGS Anterior circulation: Small/hypoplastic left A1 ACA with probably moderate stenosis. Bilateral intracranial ICAs, MCAs and ACAs are patent without proximal hemodynamically significant stenosis. Posterior circulation: Bilateral intradural  vertebral arteries, basilar artery and bilateral posterior cerebral arteries are patent without proximal hemodynamically significant stenosis. IMPRESSION: 1. No evidence of acute intracranial abnormality. 2. No large vessel occlusion. 3. Small/hypoplastic left A1 ACA with probably moderate stenosis. Electronically Signed   By: Feliberto Harts M.D.   On: 11/21/2022 10:07   US Carotid Bilateral  Result Date: 11/21/2022 CLINICAL DATA:  Syncope and collapse EXAM: BILATERAL CAROTID DUPLEX ULTRASOUND TECHNIQUE: Wallace Cullens scale imaging, color Doppler and duplex ultrasound were performed of bilateral carotid and vertebral arteries in the neck. COMPARISON:  None Available. FINDINGS: Criteria: Quantification of carotid stenosis is based on velocity parameters that correlate the residual internal carotid diameter with NASCET-based stenosis levels, using the diameter of the distal internal carotid lumen as the denominator for stenosis measurement. The following velocity measurements were obtained: RIGHT ICA: 78/14 cm/sec CCA: 89/14 cm/sec SYSTOLIC ICA/CCA RATIO:  0.9 ECA: 90 cm/sec LEFT ICA: 49/8 cm/sec CCA: 62/8 cm/sec SYSTOLIC ICA/CCA RATIO:  0.8 ECA: 189 cm/sec RIGHT CAROTID ARTERY: Mixed echogenicity partially calcified carotid bifurcation atherosclerosis. Despite this, no significant ICA stenosis, velocity elevation, turbulent flow. No significant waveform abnormality. Degree of stenosis less than 50% by ultrasound criteria. RIGHT VERTEBRAL ARTERY:  Normal antegrade flow LEFT CAROTID ARTERY: Similar calcified bifurcation atherosclerosis. No significant velocity elevation, turbulent flow, or significant stenosis. No waveform abnormality. Degree of narrowing also less than 50% by ultrasound criteria. LEFT VERTEBRAL ARTERY:  Normal antegrade flow IMPRESSION: 1. Bilateral carotid atherosclerosis. Negative for significant stenosis. Degree of narrowing less than 50% bilaterally by ultrasound criteria. 2. Patent antegrade  vertebral flow bilaterally. Electronically Signed   By: Judie Petit.  Shick M.D.   On: 11/21/2022 09:01   DG Chest Portable 1 View  Result Date: 11/21/2022 CLINICAL DATA:  Recent fall EXAM: PORTABLE CHEST 1 VIEW COMPARISON:  04/24/2020 FINDINGS: Cardiac shadow is mildly prominent but accentuated by the portable technique. Aortic calcifications are seen. The lungs are clear bilaterally. Bony abnormality is seen. IMPRESSION: No active disease. Electronically Signed   By: Alcide Clever M.D.   On: 11/21/2022 00:38   CT Cervical Spine Wo Contrast  Result Date: 11/21/2022 CLINICAL DATA:  Status post fall. EXAM: CT CERVICAL SPINE WITHOUT CONTRAST TECHNIQUE: Multidetector CT imaging of the cervical spine was performed without intravenous contrast. Multiplanar CT image reconstructions were also generated. RADIATION DOSE REDUCTION: This exam was performed according to the departmental dose-optimization program which includes automated exposure control, adjustment of the mA and/or  kV according to patient size and/or use of iterative reconstruction technique. COMPARISON:  None Available. FINDINGS: Alignment: There is straightening of the normal cervical spine lordosis. Skull base and vertebrae: No acute fracture. No primary bone lesion or focal pathologic process. Soft tissues and spinal canal: No prevertebral fluid or swelling. No visible canal hematoma. Disc levels: Marked severity endplate sclerosis, anterior osteophyte formation and posterior bony spurring are seen at the levels of C5-C6, C6-C7 and C7-T1. Moderate severity anterior osteophyte formation is present at the level of C4-C5. There is moderate severity narrowing of the anterior atlantoaxial articulation. Marked severity intervertebral disc space narrowing is seen at the levels of C5-C6, C6-C7 and C7-T1. Bilateral mild-to-moderate severity multilevel facet joint hypertrophy is noted. Upper chest: Negative. Other: The thyroid gland is enlarged and heterogeneous in  appearance. IMPRESSION: 1. No acute fracture or subluxation in the cervical spine. 2. Marked severity multilevel degenerative changes, as described above. 3. Enlarged and heterogeneous thyroid gland. Follow-up with nonemergent thyroid ultrasound is recommended. This follows ACR consensus guidelines: Managing Incidental Thyroid Nodules Detected on Imaging: White Paper of the ACR Incidental Thyroid Findings Committee. J Am Coll Radiol 2015; 12:143-150. Electronically Signed   By: Aram Candela M.D.   On: 11/21/2022 00:12   CT HEAD WO CONTRAST ( )  Result Date: 11/21/2022 CLINICAL DATA:  Status post fall. EXAM: CT HEAD WITHOUT CONTRAST TECHNIQUE: Contiguous axial images were obtained from the base of the skull through the vertex without intravenous contrast. RADIATION DOSE REDUCTION: This exam was performed according to the departmental dose-optimization program which includes automated exposure control, adjustment of the mA and/or kV according to patient size and/or use of iterative reconstruction technique. COMPARISON:  None Available. FINDINGS: Brain: There is mild cerebral atrophy with widening of the extra-axial spaces and ventricular dilatation. There are areas of decreased attenuation within the white matter tracts of the supratentorial brain, consistent with microvascular disease changes. Bilateral basal ganglia calcification is seen. A small chronic right frontoparietal white matter infarct is noted. Vascular: No hyperdense vessel or unexpected calcification. Skull: Normal. Negative for fracture or focal lesion. Sinuses/Orbits: There is moderate severity left maxillary sinus mucosal thickening. Other: None. IMPRESSION: 1. No acute intracranial abnormality. 2. Generalized cerebral atrophy with widening of the extra-axial spaces and ventricular dilatation. 3. Moderate severity left maxillary sinus disease. Electronically Signed   By: Aram Candela M.D.   On: 11/21/2022 00:07   US THYROID  Result  Date: 11/13/2022 CLINICAL DATA:  Incidental on CT. Isthmic nodule noted on neck CTA performed 10/27/2022 EXAM: THYROID ULTRASOUND TECHNIQUE: Ultrasound examination of the thyroid gland and adjacent soft tissues was performed. COMPARISON:  Neck CTA-10/27/2022 FINDINGS: Parenchymal Echotexture: Markedly heterogenous Isthmus: Enlarged measuring 0.9 cm in diameter Right lobe: Normal in size measuring 4.7 x 2.2 x 2.1 cm Left lobe: Normal in size measuring 5.0 x 2.3 x 2.3 cm _________________________________________________________ Estimated total number of nodules >/= 1 cm: 4 Number of spongiform nodules >/=  2 cm not described below (TR1): 0 Number of mixed cystic and solid nodules >/= 1.5 cm not described below (TR2): 0 _________________________________________________________ Nodule # 1: Location: Isthmus; Mid - correlates with the nodule seen on preceding neck CTA Maximum size: 2.0 cm; Other 2 dimensions: 1.8 x 1.2 cm Composition: solid/almost completely solid (2) Echogenicity: isoechoic (1) Shape: not taller-than-wide (0) Margins: extra-thyroidal extension (3) Echogenic foci: none (0) ACR TI-RADS total points: 6. ACR TI-RADS risk category: TR4 (4-6 points). ACR TI-RADS recommendations: **Given size (>/= 1.5 cm) and appearance, fine needle aspiration  of this moderately suspicious nodule should be considered based on TI-RADS criteria. _________________________________________________________ There is an approximately 1.8 x 1.7 x 1.7 cm spongiform/benign-appearing nodule within the superior pole of the right lobe of the thyroid (labeled 2), which does not meet criteria to recommend percutaneous sampling or continued dedicated follow-up. _________________________________________________________ Nodule # 3: Location: Right; Inferior Maximum size: 2.8 cm; Other 2 dimensions: 2.3 x 1.8 cm Composition: solid/almost completely solid (2) Echogenicity: isoechoic (1) Shape: not taller-than-wide (0) Margins: smooth (0) Echogenic  foci: punctate echogenic foci (3) ACR TI-RADS total points: 6. ACR TI-RADS risk category: TR4 (4-6 points). ACR TI-RADS recommendations: **Given size (>/= 1.5 cm) and appearance, fine needle aspiration of this moderately suspicious nodule should be considered based on TI-RADS criteria. _________________________________________________________ Nodule # 4: Location: Left; Mid Maximum size: 3.3 cm; Other 2 dimensions: 2.8 x 2.7 cm Composition: solid/almost completely solid (2) Echogenicity: isoechoic (1) Shape: not taller-than-wide (0) Margins: ill-defined (0) Echogenic foci: none (0) ACR TI-RADS total points: 3. ACR TI-RADS risk category: TR3 (3 points). ACR TI-RADS recommendations: **Given size (>/= 2.5 cm) and appearance, fine needle aspiration of this mildly suspicious nodule should be considered based on TI-RADS criteria. _________________________________________________________ IMPRESSION: 1. Findings suggestive of multinodular goiter. 2. Nodules labeled #1, #3 and #4 all meet imaging criteria to recommend percutaneous sampling. Current imaging recommendations are to only biopsy the two most worrisome nodules at a given time. As such, nodules labeled #1 and #3 could undergo ultrasound-guided fine-needle aspiration as indicated. (Note, nodule labeled #1 correlates with the nodule seen on preceding neck CTA). The above is in keeping with the ACR TI-RADS recommendations - J Am Coll Radiol 2017;14:587-595. Electronically Signed   By: Simonne Come M.D.   On: 11/13/2022 16:58     Assessment/Plan 1. Venous ulcer No surgery or intervention at this point in time.    I have had a long discussion with the patient regarding venous insufficiency and why it  causes symptoms, specifically venous ulceration. I have discussed with the patient the chronic skin changes that accompany venous insufficiency and the long term sequela such as infection and recurring  ulceration.  Patient will be placed in [employer] which will be  changed weekly drainage permitting.  In addition, behavioral modification including several periods of elevation of the lower extremities during the day will be continued. Achieving a position with the ankles at heart level was stressed to the patient  The patient is instructed to begin routine exercise, especially walking on a daily basis  In the future the patient can be assessed for graduated compression stockings or wraps as well as a Lymph Pump once the ulcers are healed.  2. Chronic venous insufficiency No surgery or intervention at this point in time.    I have had a long discussion with the patient regarding venous insufficiency and why it  causes symptoms, specifically venous ulceration. I have discussed with the patient the chronic skin changes that accompany venous insufficiency and the long term sequela such as infection and recurring  ulceration.  Patient will be placed in [employer] which will be changed weekly drainage permitting.  In addition, behavioral modification including several periods of elevation of the lower extremities during the day will be continued. Achieving a position with the ankles at heart level was stressed to the patient  The patient is instructed to begin routine exercise, especially walking on a daily basis  In the future the patient can be assessed for graduated compression stockings or wraps as  well as a Lymph Pump once the ulcers are healed.  3. Essential (primary) hypertension Continue antihypertensive medications as already ordered, these medications have been reviewed and there are no changes at this time.  4. Coronary artery disease involving native coronary artery of native heart without angina pectoris Continue cardiac and antihypertensive medications as already ordered and reviewed, no changes at this time.  Continue statin as ordered and reviewed, no changes at this time  Nitrates PRN for chest pain  5. Type 2 diabetes mellitus with stage  3b chronic kidney disease, with long-term current use of insulin Continue hypoglycemic medications as already ordered, these medications have been reviewed and there are no changes at this time.  Hgb A1C to be monitored as already arranged by primary service    Levora Dredge, MD  11/27/2022 1:21 PM

## 2022-11-28 LAB — METHYLMALONIC ACID, SERUM: Methylmalonic Acid, Quantitative: 284 nmol/L (ref 0–378)

## 2022-11-28 NOTE — Telephone Encounter (Signed)
Noted  

## 2022-11-28 NOTE — Telephone Encounter (Signed)
Spoke with Patient's sister Melina Modena) she states they will pick up the vitamin E and take it but they need to check both of their schedules and call back to schedule the hospital follow up.

## 2022-11-28 NOTE — Telephone Encounter (Signed)
Noted. Please follow-up with them later this week to make sure they scheduled hospital follow-up. Thanks.

## 2022-12-01 ENCOUNTER — Ambulatory Visit: Payer: Medicare Other | Admitting: Family Medicine

## 2022-12-01 NOTE — Telephone Encounter (Signed)
Called back and scheduled  with the Patient for Tuesday 12/05/22 at 11:00 but she did state she will have to contact her driver to make sure that is ok and if not she will call back to reschedule

## 2022-12-04 ENCOUNTER — Encounter (INDEPENDENT_AMBULATORY_CARE_PROVIDER_SITE_OTHER): Payer: Self-pay

## 2022-12-04 ENCOUNTER — Telehealth (INDEPENDENT_AMBULATORY_CARE_PROVIDER_SITE_OTHER): Payer: Self-pay

## 2022-12-04 ENCOUNTER — Ambulatory Visit (INDEPENDENT_AMBULATORY_CARE_PROVIDER_SITE_OTHER): Payer: Medicare Other | Admitting: Nurse Practitioner

## 2022-12-04 VITALS — BP 144/69 | HR 65 | Resp 16

## 2022-12-04 DIAGNOSIS — L97909 Non-pressure chronic ulcer of unspecified part of unspecified lower leg with unspecified severity: Secondary | ICD-10-CM

## 2022-12-04 DIAGNOSIS — I83009 Varicose veins of unspecified lower extremity with ulcer of unspecified site: Secondary | ICD-10-CM | POA: Diagnosis not present

## 2022-12-04 NOTE — Progress Notes (Signed)
History of Present Illness  There is no documented history at this time  Assessments & Plan   There are no diagnoses linked to this encounter.    Additional instructions  Subjective:  Patient presents with venous ulcer of the Bilateral lower extremity.    Procedure:  3 layer unna wrap was placed Bilateral lower extremity.   Plan:   Follow up in one week.  

## 2022-12-04 NOTE — Telephone Encounter (Signed)
[facility] will start skilled nursing for weekly bilateral unna wraps. Orders will be sent to home health. Patient has been notified.

## 2022-12-05 ENCOUNTER — Ambulatory Visit: Payer: Medicare Other | Admitting: Family Medicine

## 2022-12-07 ENCOUNTER — Telehealth (INDEPENDENT_AMBULATORY_CARE_PROVIDER_SITE_OTHER): Payer: Self-pay

## 2022-12-07 NOTE — Telephone Encounter (Signed)
That is good

## 2022-12-08 ENCOUNTER — Telehealth (INDEPENDENT_AMBULATORY_CARE_PROVIDER_SITE_OTHER): Payer: Self-pay

## 2022-12-08 NOTE — Telephone Encounter (Signed)
Patient called to cancel her upcoming appt. She said home health was coming out her house.

## 2022-12-11 ENCOUNTER — Encounter (INDEPENDENT_AMBULATORY_CARE_PROVIDER_SITE_OTHER): Payer: Medicare Other

## 2022-12-18 ENCOUNTER — Encounter (INDEPENDENT_AMBULATORY_CARE_PROVIDER_SITE_OTHER): Payer: Medicare Other

## 2022-12-20 ENCOUNTER — Other Ambulatory Visit: Payer: Self-pay

## 2022-12-21 ENCOUNTER — Other Ambulatory Visit: Payer: Self-pay | Admitting: Otolaryngology

## 2022-12-21 ENCOUNTER — Telehealth: Payer: Self-pay

## 2022-12-21 DIAGNOSIS — E041 Nontoxic single thyroid nodule: Secondary | ICD-10-CM

## 2022-12-21 NOTE — Telephone Encounter (Signed)
We received a fax for patient today and I noticed she does not have a PCP or a TOC appointment scheduled.  I spoke with patient and she states she has a new PCP outside of [place].

## 2022-12-22 ENCOUNTER — Other Ambulatory Visit: Payer: Self-pay

## 2022-12-25 ENCOUNTER — Ambulatory Visit (INDEPENDENT_AMBULATORY_CARE_PROVIDER_SITE_OTHER): Payer: Medicare Other | Admitting: Vascular Surgery

## 2022-12-27 ENCOUNTER — Other Ambulatory Visit: Payer: Self-pay

## 2022-12-28 ENCOUNTER — Ambulatory Visit (INDEPENDENT_AMBULATORY_CARE_PROVIDER_SITE_OTHER): Payer: Medicare Other | Admitting: Vascular Surgery

## 2022-12-28 ENCOUNTER — Encounter (INDEPENDENT_AMBULATORY_CARE_PROVIDER_SITE_OTHER): Payer: Self-pay

## 2023-01-01 ENCOUNTER — Ambulatory Visit: Payer: Medicare Other

## 2023-01-13 DEATH — deceased

## 2023-01-18 ENCOUNTER — Other Ambulatory Visit: Payer: Self-pay
